# Patient Record
Sex: Female | Born: 1940 | Race: White | Hispanic: Yes
Health system: Western US, Academic
[De-identification: ages and names within clinical notes are randomized; demographics above are authoritative.]

## PROBLEM LIST (undated history)

## (undated) DIAGNOSIS — K5790 Diverticulosis of intestine, part unspecified, without perforation or abscess without bleeding: Secondary | ICD-10-CM

## (undated) DIAGNOSIS — Z87898 Personal history of other specified conditions: Secondary | ICD-10-CM

## (undated) DIAGNOSIS — E876 Hypokalemia: Secondary | ICD-10-CM

## (undated) DIAGNOSIS — C801 Malignant (primary) neoplasm, unspecified: Secondary | ICD-10-CM

## (undated) DIAGNOSIS — F419 Anxiety disorder, unspecified: Secondary | ICD-10-CM

## (undated) DIAGNOSIS — E785 Hyperlipidemia, unspecified: Secondary | ICD-10-CM

## (undated) DIAGNOSIS — I10 Essential (primary) hypertension: Secondary | ICD-10-CM

## (undated) DIAGNOSIS — E079 Disorder of thyroid, unspecified: Secondary | ICD-10-CM

## (undated) DIAGNOSIS — C73 Malignant neoplasm of thyroid gland: Secondary | ICD-10-CM

## (undated) DIAGNOSIS — G4733 Obstructive sleep apnea (adult) (pediatric): Secondary | ICD-10-CM

## (undated) DIAGNOSIS — Z8669 Personal history of other diseases of the nervous system and sense organs: Secondary | ICD-10-CM

## (undated) DIAGNOSIS — Z8719 Personal history of other diseases of the digestive system: Secondary | ICD-10-CM

## (undated) DIAGNOSIS — C189 Malignant neoplasm of colon, unspecified: Secondary | ICD-10-CM

## (undated) DIAGNOSIS — G4730 Sleep apnea, unspecified: Secondary | ICD-10-CM

## (undated) DIAGNOSIS — D649 Anemia, unspecified: Secondary | ICD-10-CM

## (undated) HISTORY — DX: Hypokalemia: E87.6

## (undated) HISTORY — PX: APPENDECTOMY: SHX54

## (undated) HISTORY — PX: TONSILLECTOMY: SUR1361

## (undated) HISTORY — DX: Malignant neoplasm of thyroid gland: C73

## (undated) HISTORY — DX: Personal history of other diseases of the nervous system and sense organs: Z86.69

## (undated) HISTORY — DX: Personal history of other specified conditions: Z87.898

## (undated) HISTORY — DX: Hyperlipidemia, unspecified: E78.5

## (undated) HISTORY — DX: Anxiety disorder, unspecified: F41.9

## (undated) HISTORY — DX: Essential (primary) hypertension: I10

## (undated) HISTORY — DX: Obstructive sleep apnea (adult) (pediatric): G47.33

## (undated) HISTORY — DX: Malignant (primary) neoplasm, unspecified: C80.1

## (undated) HISTORY — DX: Personal history of other diseases of the digestive system: Z87.19

## (undated) HISTORY — DX: Diverticulosis of intestine, part unspecified, without perforation or abscess without bleeding: K57.90

## (undated) HISTORY — DX: Disorder of thyroid, unspecified: E07.9

## (undated) HISTORY — DX: Malignant neoplasm of colon, unspecified: C18.9

## (undated) HISTORY — DX: Anemia, unspecified: D64.9

## (undated) HISTORY — PX: PR APPENDECTOMY: 44950

---

## 1988-10-09 DIAGNOSIS — N63 Unspecified lump in unspecified breast: Secondary | ICD-10-CM

## 1988-10-09 HISTORY — PX: MASTECTOMY: SHX3

## 1988-10-09 HISTORY — DX: Unspecified lump in unspecified breast: N63.0

## 1988-10-09 HISTORY — PX: PR UNLISTED PROCEDURE BREAST: 19499

## 1999-10-10 DIAGNOSIS — C801 Malignant (primary) neoplasm, unspecified: Secondary | ICD-10-CM

## 1999-10-10 HISTORY — DX: Malignant (primary) neoplasm, unspecified: C80.1

## 2000-06-13 ENCOUNTER — Other Ambulatory Visit: Admission: RE | Admit: 2000-06-13 | Discharge: 2000-06-13 | Payer: Self-pay | Admitting: *Deleted

## 2000-08-13 ENCOUNTER — Encounter: Payer: Self-pay | Admitting: Emergency Medicine

## 2000-08-13 ENCOUNTER — Emergency Department (HOSPITAL_COMMUNITY): Admission: EM | Admit: 2000-08-13 | Discharge: 2000-08-14 | Payer: Self-pay | Admitting: Emergency Medicine

## 2002-04-01 ENCOUNTER — Other Ambulatory Visit: Admission: RE | Admit: 2002-04-01 | Discharge: 2002-04-01 | Payer: Self-pay | Admitting: *Deleted

## 2003-04-18 ENCOUNTER — Emergency Department (HOSPITAL_COMMUNITY): Admission: EM | Admit: 2003-04-18 | Discharge: 2003-04-18 | Payer: Self-pay | Admitting: Emergency Medicine

## 2003-04-18 ENCOUNTER — Encounter: Payer: Self-pay | Admitting: Emergency Medicine

## 2003-05-18 ENCOUNTER — Encounter: Payer: Self-pay | Admitting: Cardiology

## 2003-05-18 ENCOUNTER — Inpatient Hospital Stay (HOSPITAL_COMMUNITY): Admission: AD | Admit: 2003-05-18 | Discharge: 2003-05-23 | Payer: Self-pay | Admitting: Cardiology

## 2003-05-19 ENCOUNTER — Encounter: Payer: Self-pay | Admitting: Cardiology

## 2003-05-20 ENCOUNTER — Encounter: Payer: Self-pay | Admitting: Cardiology

## 2004-02-12 ENCOUNTER — Other Ambulatory Visit: Admission: RE | Admit: 2004-02-12 | Discharge: 2004-02-12 | Payer: Self-pay | Admitting: *Deleted

## 2004-11-15 ENCOUNTER — Ambulatory Visit: Payer: Self-pay | Admitting: Internal Medicine

## 2005-05-05 ENCOUNTER — Ambulatory Visit (HOSPITAL_COMMUNITY): Admission: RE | Admit: 2005-05-05 | Discharge: 2005-05-05 | Payer: Self-pay | Admitting: Cardiology

## 2005-05-30 ENCOUNTER — Ambulatory Visit (HOSPITAL_COMMUNITY): Admission: RE | Admit: 2005-05-30 | Discharge: 2005-05-30 | Payer: Self-pay | Admitting: Cardiology

## 2005-08-09 HISTORY — PX: BREAST CAPSULECTOMY: SHX906

## 2005-08-29 ENCOUNTER — Ambulatory Visit (HOSPITAL_COMMUNITY): Admission: RE | Admit: 2005-08-29 | Discharge: 2005-08-29 | Payer: Self-pay | Admitting: Plastic Surgery

## 2005-08-29 ENCOUNTER — Encounter (INDEPENDENT_AMBULATORY_CARE_PROVIDER_SITE_OTHER): Payer: Self-pay | Admitting: Specialist

## 2007-12-26 ENCOUNTER — Ambulatory Visit: Payer: Self-pay | Admitting: Oncology

## 2009-10-09 DIAGNOSIS — C73 Malignant neoplasm of thyroid gland: Secondary | ICD-10-CM

## 2009-10-09 HISTORY — PX: THYROID SURGERY: SHX5115

## 2009-10-09 HISTORY — DX: Malignant neoplasm of thyroid gland: C73

## 2010-01-14 ENCOUNTER — Encounter: Admission: RE | Admit: 2010-01-14 | Discharge: 2010-01-14 | Payer: Self-pay | Admitting: Otolaryngology

## 2010-02-01 ENCOUNTER — Other Ambulatory Visit: Admission: RE | Admit: 2010-02-01 | Discharge: 2010-02-01 | Payer: Self-pay | Admitting: Interventional Radiology

## 2010-02-01 ENCOUNTER — Encounter: Admission: RE | Admit: 2010-02-01 | Discharge: 2010-02-01 | Payer: Self-pay | Admitting: Otolaryngology

## 2010-02-06 HISTORY — PX: TOTAL THYROIDECTOMY: SHX2547

## 2010-02-06 HISTORY — PX: THYROID CYST EXCISION: SHX2511

## 2010-03-08 DIAGNOSIS — C73 Malignant neoplasm of thyroid gland: Secondary | ICD-10-CM

## 2010-03-08 HISTORY — DX: Malignant neoplasm of thyroid gland: C73

## 2010-06-03 ENCOUNTER — Ambulatory Visit: Payer: Self-pay | Admitting: Cardiology

## 2010-06-06 ENCOUNTER — Ambulatory Visit: Payer: Self-pay | Admitting: Cardiology

## 2010-09-22 ENCOUNTER — Ambulatory Visit: Payer: Self-pay | Admitting: Cardiology

## 2010-09-23 ENCOUNTER — Ambulatory Visit: Payer: Self-pay | Admitting: Cardiology

## 2011-01-23 ENCOUNTER — Ambulatory Visit: Payer: Self-pay | Admitting: Cardiology

## 2011-01-23 ENCOUNTER — Other Ambulatory Visit: Payer: Self-pay | Admitting: *Deleted

## 2011-02-03 ENCOUNTER — Encounter: Payer: Self-pay | Admitting: *Deleted

## 2011-02-03 DIAGNOSIS — F419 Anxiety disorder, unspecified: Secondary | ICD-10-CM

## 2011-02-03 DIAGNOSIS — I10 Essential (primary) hypertension: Secondary | ICD-10-CM

## 2011-02-03 DIAGNOSIS — Z87898 Personal history of other specified conditions: Secondary | ICD-10-CM | POA: Insufficient documentation

## 2011-02-03 DIAGNOSIS — E785 Hyperlipidemia, unspecified: Secondary | ICD-10-CM

## 2011-02-08 ENCOUNTER — Other Ambulatory Visit (INDEPENDENT_AMBULATORY_CARE_PROVIDER_SITE_OTHER): Payer: Medicare Other | Admitting: *Deleted

## 2011-02-08 DIAGNOSIS — Z79899 Other long term (current) drug therapy: Secondary | ICD-10-CM

## 2011-02-08 DIAGNOSIS — E7800 Pure hypercholesterolemia, unspecified: Secondary | ICD-10-CM

## 2011-02-08 DIAGNOSIS — C73 Malignant neoplasm of thyroid gland: Secondary | ICD-10-CM

## 2011-02-08 LAB — CBC WITH DIFFERENTIAL/PLATELET
Basophils Relative: 0.5 % (ref 0.0–3.0)
Eosinophils Relative: 1.2 % (ref 0.0–5.0)
Hemoglobin: 13.7 g/dL (ref 12.0–15.0)
Lymphs Abs: 1.7 10*3/uL (ref 0.7–4.0)
Neutro Abs: 3.9 10*3/uL (ref 1.4–7.7)
Neutrophils Relative %: 64.5 % (ref 43.0–77.0)
Platelets: 208 10*3/uL (ref 150.0–400.0)
RDW: 13.9 % (ref 11.5–14.6)
WBC: 6 10*3/uL (ref 4.5–10.5)

## 2011-02-08 LAB — LIPID PANEL
Cholesterol: 270 mg/dL — ABNORMAL HIGH (ref 0–200)
HDL: 42.1 mg/dL (ref >39.00)
Total CHOL/HDL Ratio: 6
Triglycerides: 330 mg/dL — ABNORMAL HIGH (ref 0.0–149.0)

## 2011-02-08 LAB — COMPREHENSIVE METABOLIC PANEL
CO2: 28 mEq/L (ref 19–32)
Chloride: 107 mEq/L (ref 96–112)
GFR: 106.65 mL/min (ref >60.00)
Glucose, Bld: 96 mg/dL (ref 70–99)
Potassium: 3.9 mEq/L (ref 3.5–5.1)

## 2011-02-08 LAB — LDL CHOLESTEROL, DIRECT: Direct LDL: 175.5 mg/dL

## 2011-02-08 LAB — TSH: TSH: 0.05 u[IU]/mL — ABNORMAL LOW (ref 0.35–5.50)

## 2011-02-09 ENCOUNTER — Encounter: Payer: Self-pay | Admitting: Cardiology

## 2011-02-09 ENCOUNTER — Ambulatory Visit (INDEPENDENT_AMBULATORY_CARE_PROVIDER_SITE_OTHER): Payer: Medicare Other | Admitting: Cardiology

## 2011-02-09 DIAGNOSIS — E039 Hypothyroidism, unspecified: Secondary | ICD-10-CM

## 2011-02-09 DIAGNOSIS — E785 Hyperlipidemia, unspecified: Secondary | ICD-10-CM

## 2011-02-09 DIAGNOSIS — I10 Essential (primary) hypertension: Secondary | ICD-10-CM

## 2011-02-09 NOTE — Assessment & Plan Note (Signed)
The patient has a past history of essential hypertension.  She is intolerant of most medications but has been doing well with her present combination of Azor 10/20 one daily.  She's not having a chest pain or shortness of breath.  No dizzy spells or syncope.  No headaches.  No palpitations or PVCs noted

## 2011-02-09 NOTE — Assessment & Plan Note (Signed)
The patient has a past history of thyroid cancer.  She is followed closely by her endocrinologist at [facility].  She is on levothyroxin to suppress her thyroid gland and her thyroid blood work today is satisfactory in that regard

## 2011-02-09 NOTE — Progress Notes (Signed)
Lynett Fish Lindell Date of Birth:  [birth year] [facility] / [facility] [address]   [address] [place], [place]  [address] [phone]           Fax   [phone]  History of Present Illness: This pleasant 70 year old woman is seen for a scheduled 4 month followup office visit.  As a complex past medical history.  She has a past history of thyroid cancer.  She is followed closely for this at [facility].  She has a past history of essential hypertension.  She did not tolerate beta blockers but is tolerating Azor Without side effects.  She is not having any significant ankle edema from the amlodipine component.  She does not have any history of known coronary disease and she had a normal Cardiolite stress test 08/14/07.  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  When she Is in [place] she works out with her trainer 2 or 3 times a week.   Current Outpatient Prescriptions  Medication Sig Dispense Refill  . amlodipine-olmesartan (AZOR) 10-20 MG per tablet Take 1 tablet by mouth daily.        . Ascorbic Acid (VITAMIN C PO) Take 1 tablet by mouth 3 (three) times daily.        Marland Kitchen aspirin 81 MG tablet Take 81 mg by mouth at bedtime.        . B Complex Vitamins (VITAMIN B COMPLEX) TABS Take 1 tablet by mouth daily.        . calcium carbonate (OS-CAL) 600 MG TABS Take 600 mg by mouth 3 (three) times daily.        . Cholecalciferol (VITAMIN D PO) Take 5,000 mg by mouth daily.       Marland Kitchen CINNAMON PO Take 1 tablet by mouth 2 (two) times daily.        . Coenzyme Q10 (COQ-10 PO) Take 1 tablet by mouth 2 (two) times daily.        . Deanol Bitartrate (DMAE BITARTRATE) POWD by Does not apply route daily.        Marland Kitchen levothyroxine (SYNTHROID, LEVOTHROID) 112 MCG tablet Take 0.125 mcg by mouth daily.       . Multiple Vitamins-Minerals (ZINC PO) Take 1 tablet by mouth daily.        . Omega-3 Fatty Acids (OMEGA 3 PO) Take 1 capsule by mouth 3 (three)  times daily.        Marland Kitchen PROBIOTIC CAPS Take 1 capsule by mouth 3 (three) times daily.        . SELENIUM PO Take 1 tablet by mouth daily.        . vitamin E 400 UNIT capsule Take 400 Units by mouth daily.        . Calcium-Magnesium-Vitamin D 600-40-500 MG-MG-UNIT TB24 Take 1 tablet by mouth daily.        . Red Yeast Rice 600 MG CAPS Take 2 capsules by mouth 3 (three) times daily.          Allergies  Allergen Reactions  . Crestor (Rosuvastatin Calcium)     Palp.  . Levaquin   . Lipitor (Atorvastatin Calcium)   . Niacin And Related   . Phenergan   . Sulfa Antibiotics     Patient Active Problem List  Diagnoses  . Hypertension  . Hyperlipidemia  . Anxiety  . History of dizziness  . Hypothyroidism    History  Smoking status  . Former  Smoker  Smokeless tobacco  . Not on file    History  Alcohol Use No    Family History  Problem Relation Age of Onset  . Heart attack Father   . Heart disease      family positive for  . Hypertension Brother   . Hypertension Brother   . Breast cancer Sister     Review of Systems: Constitutional: no fever chills diaphoresis or fatigue or change in weight.  Head and neck: no hearing loss, no epistaxis, no photophobia or visual disturbance. Respiratory: No cough, shortness of breath or wheezing. Cardiovascular: No chest pain peripheral edema, palpitations. Gastrointestinal: No abdominal distention, no abdominal pain, no change in bowel habits hematochezia or melena. Genitourinary: No dysuria, no frequency, no urgency, no nocturia. Musculoskeletal:No arthralgias, no back pain, no gait disturbance or myalgias. Neurological: No dizziness, no headaches, no numbness, no seizures, no syncope, no weakness, no tremors. Hematologic: No lymphadenopathy, no easy bruising. Psychiatric: No confusion, no hallucinations, no sleep disturbance.    Physical Exam: Filed Vitals:   02/09/11 1145  BP: 130/80  Pulse: 68   The general appearance is that  of a well-developed well-nourished woman in no distress.Pupils equal and reactive.   Extraocular Movements are full.  There is no scleral icterus.  The mouth and pharynx are normal.  The neck is supple.  The carotids reveal no bruits.  The jugular venous pressure is normal.  The thyroid is not enlarged.  There is no lymphadenopathy.The chest is clear to percussion and auscultation. There are no rales or rhonchi. Expansion of the chest is symmetrical.  The breasts were not examined.  She has a prior history of breast cancer in the right breast.The precordium is quiet.  The first heart sound is normal.  The second heart sound is physiologically split.  There is no murmur gallop rub or click.  There is no abnormal lift or heave.The abdomen is soft and nontender. Bowel sounds are normal. The liver and spleen are not enlarged. There Are no abdominal masses. There are no bruits.The pedal pulses are good.  There is no phlebitis or edema.  There is no cyanosis or clubbing.Strength is normal and symmetrical in all extremities.  There is no lateralizing weakness.  There are no sensory deficits.The skin is warm and dry.  There is no rash.No muscle weakness or atrophy.  No muscle tenderness.  Assessment / Plan: We reviewed her labs which are overall better.  Continue same medication and be rechecked in 4 months for followup office visit and fasting lab work.

## 2011-02-09 NOTE — Assessment & Plan Note (Signed)
This patient has a long history of severe hypercholesterolemia.  She is intolerant of all of the statin drugs she also is unable to take niacin.  She is taking some over-the-counter form of red yeast rice.  Her lipids remain significantly elevated although they're slightly improved since last visit.  She is being careful with her vegan diet.

## 2011-02-23 ENCOUNTER — Ambulatory Visit (INDEPENDENT_AMBULATORY_CARE_PROVIDER_SITE_OTHER): Payer: Medicare Other | Admitting: Cardiology

## 2011-02-23 ENCOUNTER — Encounter: Payer: Self-pay | Admitting: Cardiology

## 2011-02-23 DIAGNOSIS — R110 Nausea: Secondary | ICD-10-CM

## 2011-02-23 DIAGNOSIS — R42 Dizziness and giddiness: Secondary | ICD-10-CM

## 2011-02-23 DIAGNOSIS — N390 Urinary tract infection, site not specified: Secondary | ICD-10-CM

## 2011-02-23 MED ORDER — MECLIZINE HCL 25 MG PO TABS: 25.0000 mg | ORAL_TABLET | Freq: Three times a day (TID) | ORAL | Status: AC | PRN

## 2011-02-23 MED ORDER — AMOXICILLIN 500 MG PO CAPS: 500.0000 mg | ORAL_CAPSULE | Freq: Three times a day (TID) | ORAL | Status: AC

## 2011-02-23 NOTE — Progress Notes (Signed)
Darlene Conway Sitter Date of Birth:  1941-09-17 [facility] / [facility] [address]   [address] [place], [place]  [address] [phone]           Fax   [phone]  HPI: This 70 year old woman is seen for a work in walking office visit.  She comes in because of dizziness and nausea and possible urinary tract symptoms.  Her illness started last evening.  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  She has noted some urinary frequency and dysuria.  She has a past history of thyroid cancer and a past history of breast cancer end of hypercholesterolemia and essential hypertension  Current Outpatient Prescriptions  Medication Sig Dispense Refill  . amlodipine-olmesartan (AZOR) 10-20 MG per tablet Take 1 tablet by mouth daily.        . Ascorbic Acid (VITAMIN C PO) Take 1 tablet by mouth 3 (three) times daily.        Marland Kitchen aspirin 81 MG tablet Take 81 mg by mouth at bedtime.        . B Complex Vitamins (VITAMIN B COMPLEX) TABS Take 1 tablet by mouth daily.        . calcium carbonate (OS-CAL) 600 MG TABS Take 600 mg by mouth 3 (three) times daily.        . Calcium-Magnesium-Vitamin D 600-40-500 MG-MG-UNIT TB24 Take 1 tablet by mouth daily.        . Cholecalciferol (VITAMIN D PO) Take 5,000 mg by mouth daily.       Marland Kitchen CINNAMON PO Take 1 tablet by mouth 2 (two) times daily.        . Coenzyme Q10 (COQ-10 PO) Take 1 tablet by mouth 2 (two) times daily.        . Deanol Bitartrate (DMAE BITARTRATE) POWD by Does not apply route daily.        Marland Kitchen levothyroxine (SYNTHROID, LEVOTHROID) 112 MCG tablet Take 0.125 mcg by mouth daily.       . Multiple Vitamins-Minerals (ZINC PO) Take 1 tablet by mouth daily.        . Omega-3 Fatty Acids (OMEGA 3 PO) Take 1 capsule by mouth 3 (three) times daily.        Marland Kitchen PROBIOTIC CAPS Take 1 capsule by mouth 3 (three) times daily.        . SELENIUM PO Take 1 tablet by mouth daily.        . vitamin E 400 UNIT capsule Take  400 Units by mouth daily.        Marland Kitchen amoxicillin (AMOXIL) 500 MG capsule Take 1 capsule (500 mg total) by mouth 3 (three) times daily.  20 capsule  0  . meclizine (ANTIVERT) 25 MG tablet Take 1 tablet (25 mg total) by mouth 3 (three) times daily as needed.  30 tablet  0  . DISCONTD: Red Yeast Rice 600 MG CAPS Take 2 capsules by mouth 3 (three) times daily.          Allergies  Allergen Reactions  . Crestor (Rosuvastatin Calcium)     Palp.  . Levaquin   . Lipitor (Atorvastatin Calcium)   . Niacin And Related   . Phenergan   . Sulfa Antibiotics     Patient Active Problem List  Diagnoses  . Hypertension  . Hyperlipidemia  . Anxiety  . History of dizziness  . Hypothyroidism  . Nausea    History  Smoking status  .  Former Smoker  Smokeless tobacco  . Not on file    History  Alcohol Use No    Family History  Problem Relation Age of Onset  . Heart attack Father   . Heart disease      family positive for  . Hypertension Brother   . Hypertension Brother   . Breast cancer Sister     Review of Systems: The patient denies any heat or cold intolerance.  No weight gain or weight loss.  The patient denies headaches or blurry vision.  There is no cough or sputum production. .  There is no hematuria or hematochezia.  The patient denies any muscle aches or arthritis.  The patient denies any rash.  The patient denies frequent falling or instability.  There is no history of depression or anxiety.  All other systems were reviewed and are negative.   Physical Exam: Filed Vitals:   02/23/11 1554  BP: 140/80  Pulse: 108  The general appearance reveals a flushed elderly woman in no acute distress.Pupils equal and reactive.   Extraocular Movements are full.  There is no scleral icterus.  The mouth and pharynx are normal.  The neck is supple.  The carotids reveal no bruits.  The jugular venous pressure is normal.  The thyroid is not enlarged.  There is no lymphadenopathy.The chest is clear  to percussion and auscultation. There are no rales or rhonchi. Expansion of the chest is symmetrical.The precordium is quiet.  The first heart sound is normal.  The second heart sound is physiologically split.  There is no murmur gallop rub or click.  There is no abnormal lift or heave.The abdomen is soft and nontender. Bowel sounds are normal. The liver and spleen are not enlarged. There Are no abdominal masses. There are no bruits.  There is no right upper quadrant tenderness.  Normal extremity without phlebitis or edema and pedal pulses are good    Assessment / Plan: By the time we finished examining her that time was after 5 PM and we could not get any labs.  Her skin feels hot and she appears to be febrile and her symptoms are suggestive of a urinary tract infection so we empirically prescribed amoxicillin 500 mg 3 times a day #20.  She is to go home to bed and to force fluids.  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  She will call us back tomorrow if she's not feeling better and at that point we can arrange for her to get a urinalysis a CBC and a comprehensive metabolic panel.  We also called her in some meclizine to help with the dizziness.  She may also have a component of vertigo.

## 2011-02-23 NOTE — Assessment & Plan Note (Signed)
This patient is seen as a walk-in work in late in the afternoon.  She has been nauseated since last evening.  She has not vomited today.  2 weeks ago she had a similar episode of nausea associated with vomiting which lasted several days.  At that time she thought it might be a virus.  This time she has also been having urinary frequency and dysuria.  She was not aware that she was running a fever Until I commented to her that her skin felt very warm.The patient still has her gallbladder.  She has not had any right upper quadrant pain.  She does have frequent trips out of the country to [place] to visit her mother.

## 2011-02-24 ENCOUNTER — Telehealth: Payer: Self-pay | Admitting: Cardiology

## 2011-02-24 ENCOUNTER — Emergency Department (HOSPITAL_COMMUNITY): Payer: Medicare Other

## 2011-02-24 ENCOUNTER — Emergency Department (HOSPITAL_COMMUNITY)
Admission: EM | Admit: 2011-02-24 | Discharge: 2011-02-24 | Disposition: A | Discharge status: Home / Self Care | Payer: Medicare Other | Attending: Emergency Medicine | Admitting: Emergency Medicine

## 2011-02-24 DIAGNOSIS — R42 Dizziness and giddiness: Secondary | ICD-10-CM | POA: Insufficient documentation

## 2011-02-24 DIAGNOSIS — R259 Unspecified abnormal involuntary movements: Secondary | ICD-10-CM | POA: Insufficient documentation

## 2011-02-24 DIAGNOSIS — G319 Degenerative disease of nervous system, unspecified: Secondary | ICD-10-CM | POA: Insufficient documentation

## 2011-02-24 DIAGNOSIS — R5381 Other malaise: Secondary | ICD-10-CM | POA: Insufficient documentation

## 2011-02-24 LAB — POCT CARDIAC MARKERS: CKMB, poc: 1.3 ng/mL (ref 1.0–8.0)

## 2011-02-24 LAB — DIFFERENTIAL
Basophils Absolute: 0 10*3/uL (ref 0.0–0.1)
Eosinophils Relative: 0 % (ref 0–5)
Lymphocytes Relative: 15 % (ref 12–46)
Lymphs Abs: 2.1 10*3/uL (ref 0.7–4.0)
Monocytes Absolute: 0.9 10*3/uL (ref 0.1–1.0)
Monocytes Relative: 7 % (ref 3–12)
Neutrophils Relative %: 77 % (ref 43–77)

## 2011-02-24 LAB — CBC
MCHC: 35.1 g/dL (ref 30.0–36.0)
MCV: 84.4 fL (ref 78.0–100.0)

## 2011-02-24 LAB — URINE MICROSCOPIC-ADD ON

## 2011-02-24 LAB — URINALYSIS, ROUTINE W REFLEX MICROSCOPIC
Bilirubin Urine: NEGATIVE
Ketones, ur: NEGATIVE mg/dL
Specific Gravity, Urine: 1.009 (ref 1.005–1.030)
pH: 6 (ref 5.0–8.0)

## 2011-02-24 LAB — BASIC METABOLIC PANEL
BUN: 13 mg/dL (ref 6–23)
CO2: 25 mEq/L (ref 19–32)
Chloride: 100 mEq/L (ref 96–112)
GFR calc Af Amer: >60 mL/min (ref >60)
GFR calc non Af Amer: >60 mL/min (ref >60)
Potassium: 3.6 mEq/L (ref 3.5–5.1)
Sodium: 137 mEq/L (ref 135–145)

## 2011-02-24 LAB — APTT: aPTT: 36 seconds (ref 24–37)

## 2011-02-24 NOTE — Telephone Encounter (Signed)
Seen yesterday and is doing worse today.  Spoke with her neighbor who said that she is disoriented, dizzy and is having trouble verbalizing things.  The neighbor would like to speak with Melinda,RN regarding the next steps to take from here.  Possible referral to a neurologist.

## 2011-02-24 NOTE — Consult Note (Signed)
NAME:  Darlene Conway, Darlene Conway                        ACCOUNT NO.:  [id number]   MEDICAL RECORD NO.:  [id number]                   PATIENT TYPE:  INP   LOCATION:  4704                                 FACILITY:  [facility]   PHYSICIAN:  Gabrielle Dare. Janee Morn, M.D.             DATE OF BIRTH:  30-Sep-1941   DATE OF CONSULTATION:  05/21/2003  DATE OF DISCHARGE:                                   CONSULTATION   REASON FOR CONSULTATION:  Gallstones.   HISTORY OF PRESENT ILLNESS:  The patient is a 70 year old white female who  was admitted by Dr. Patty Sermons for uncontrolled hypertension during her  subsequent workup, her blood pressure has come under control. She was noted  to have gallstones with no evidence of acute cholecystitis.  Dr. Patty Sermons  suggested that she may want to go ahead and have her gallbladder taken out  during this admission as she travels frequently. She denies any right upper  quadrant symptoms in the recent past and is not having any abdominal pain at  all right now and has no other current abdominal complaints.   PAST MEDICAL HISTORY:  Right breast cancer and hypertension.   PAST SURGICAL HISTORY:  Appendectomy, tonsillectomy, right mastectomy.   SOCIAL HISTORY:  She does not drink alcohol and she does not smoke.   FAMILY HISTORY:  Positive for hypercholesterolemia and hypertension.   CURRENT MEDICATIONS:  1. Metoprolol 100 mg p.o. daily.  2. Potassium 10 mEq p.o. t.i.d.  3. Cozaar 100 mg p.o. q.h.s.  4. Maxzide/Dyazide one tablet p.o. daily.  5. Crestor 5 mg p.o. daily.  6. Labetalol p.r.n.   REVIEW OF SYSTEMS:  GENERAL:  She is feeling much better than when she was  admitted.  PULMONARY: No complaints.  CARDIOVASCULAR:  No chest pain.  ABDOMEN: No complaints.  MUSCULOSKELETAL: No current complaints. The  remainder of the review of systems is negative.   PHYSICAL EXAMINATION:  VITAL SIGNS:  Afebrile. Awake, alert, and in no acute  distress.  HEENT:  Sclerae nonicteric.  NECK:  Supple.  LUNGS:  Clear to auscultation bilaterally.  HEART:  Regular rate and rhythm.  Peripheral pulses are 2+ and equal  bilaterally.  ABDOMEN: Soft and nontender and nondistended.  There are no masses  appreciated.  EXTREMITIES:  Warm with no edema.  SKIN:  Dry with no rashes.  VITAL SIGNS:  Heart rate 96, blood pressure 130/94, saturation 97% on room  air.   LABORATORY DATA:  Ultrasound report which showed gallstones, but no  pericholecystic fluid, no gallbladder wall thickening and no biliary ductal  dilatation.   CBC on August 9 was within normal limits and LFT's on August 9 were also  within normal limits.   ASSESSMENT:  Cholelithiasis without current symptoms.  Concerning the  patient's job, I do agree it would be a good idea for her to get her  gallbladder out.  She is not willing to go  ahead with the surgery right now.  She is coming to terms with it and requests some time to think about it.  She wishes also to see Anselm Pancoast. Zachery Dakins, M.D. from our practice as he  did her appendectomy in 1990.  Considering the fact that she does not have  any signs of acute cholecystitis, I feel waiting and having this done  electively is an appropriate choice.  I instructed her on the signs and  symptoms of an attack of biliary colic and cholecystitis and also the  symptoms she may get if she were to get choledocholithiasis and she  expressed understanding, but claims she prefers to wait and see Dr.  Zachery Dakins and schedule this electively.  I discussed this at length with her  and her son and I feel this is an appropriate choice and I gave her a card.  She plans to call our office once she is discharged.                                               Gabrielle Dare Janee Morn, M.D.    BET/MEDQ  D:  05/21/2003  T:  05/22/2003  Job:  [id number]

## 2011-02-24 NOTE — Telephone Encounter (Signed)
Patient seen yesterday and given rx for amoxil and meclizine.  Spoke with patient and advised if she felt she needed to see a neurologist today she would need to go to the emergency room.  Since she was having trouble verbalizing I felt she needed to be evaluated by a physician and Dr, Patty Sermons who is her primary was not in the office and covering are cardiologist.  Advised she needed to go to urgent care or emergency room and not wait until next week.  Patient did deny weakness on one side more than the other, just weak in general.  Patient stated she would think about her options and make a decision.  Asked if she had someone to drive her, stated yes.  Strongly encouraged her to go to UC or ED to be evaluated.

## 2011-02-24 NOTE — Discharge Summary (Signed)
NAMEMarland Conway  Conway, [name]                        ACCOUNT NO.:  [id number]   MEDICAL RECORD NO.:  [id number]                   PATIENT TYPE:  INP   LOCATION:  4704                                 FACILITY:  [facility]   PHYSICIAN:  Cassell Clement, M.D.              DATE OF BIRTH:  01-Mar-1941   DATE OF ADMISSION:  05/18/2003  DATE OF DISCHARGE:  05/23/2003                                 DISCHARGE SUMMARY   FINAL DIAGNOSES:  1. Malignant hypertension.  2. Chest pain.  3. Headache.  4. Past history of breast cancer.  5. Hypercholesterolemia.  6. Diarrhea.  7. Cholelithiasis.  8. Hypokalemia.  9. Anxiety.  10.      Hyperlipidemia.   OPERATION PERFORMED:  Magnetic resonance angiography of renal arteries.   HISTORY:  This 70 year old woman was admitted with severe hypertension and  chest pressure.  She has a long history of mild essential hypertension with  recent acceleration.  She presented to the [facility] Emergency Room on April 18, 2003 with a blood pressure of 195/100 and at that time was found to be  hyponatremic.  Earlier that day, she had gone for a hike in the very hot  weather and had become dehydrated, exacerbated by having been placed on  diuretics for her blood pressure shortly before that.  At the time of this  admission, she is on Cozaar 100 mg and Toprol 150 mg.  She was seen at  Urgent Care by Dr. Stan Head. Daub on May 14, 2003 and she reports that her  blood pressure there was in the 230/130 range.  A 24-hour urine collection  for metanephrines was arranged by Dr. Cleta Alberts to be sent to [facility].  The  patient was seen as a work-in in our office on May 18, 2003, complaining  of intermittent chest pressure, headache and her blood pressure in the left  arm was 180/115 and she was complaining of blurred vision and it was felt  best to admit her for inpatient workup.   PHYSICAL EXAMINATION:  VITAL SIGNS:  Physical exam on admission included  blood pressure of 180/115.  Pulse  is 70.  HEENT:  The disks are flat but there is arteriolar narrowing.  NECK:  Jugular venous pressure is normal.  Carotids normal.  LUNGS:  Lungs clear.  HEART:  Heart reveals S4 gallop.  ABDOMEN:  Abdomen is soft and nontender.  There are no renal bruits heard.  EXTREMITIES:  Extremities show no edema.  Pedal pulses are present.   HOSPITAL COURSE:  The patient was admitted to telemetry.  We continued to  run her beta blocker and her ARB.  She also was given oral Catapres by the  on-call physician later that evening with subsequent marked lowering of her  blood pressure and symptoms of awakeness, clamminess and restless legs.  When seen on rounds the next morning, her blood pressure was down to 103/59  and she felt very weak.  Medication was held and an MRI/MRA of the adrenals  and kidneys were scheduled.  The MRA showed no renal artery stenosis, but  the MRI showed question of gallbladder stones versus polyps.  The patient,  on telemetry, had six beats of wide complex tachycardia at 6 a.m. on May 20, 2003.  This did not require specific therapy.  Lipids were again noted  to be markedly elevated.  In the past, she had refused most efforts at  adding a statin drug.  She had had brief encounters with Zocor and Lipitor,  both of which caused leg pain and were discontinued.  We felt that we might  try a half dose of Crestor, which would be only 5 mg a day, but the patient,  after the first dose, complained of abdominal pain and Crestor was  subsequently stopped, although it was not clear as to whether the Crestor  was related to her symptoms or not.  She had a second bout of significant  hypertension and once again was given a very small dose of clonidine with  subsequent severe side-reaction, so we have eliminated clonidine from  further consideration in her drug armamentarium.  The patient remained  extremely anxious throughout the hospital stay, but would not initially take  any  antianxiety drugs.  She did agree to having a sleeping pill ordered for  her, however.  It was felt that the patient should be evaluated by our  surgical colleagues in regard to possible elective cholecystectomy.  The  patient has been having some symptoms of nausea and diarrhea but no clearcut  gallbladder colic.  Nonetheless, because of her frequent out-of[place]the[place]country  traveling, it would be reasonable to consider an elective cholecystectomy.  Dr. Gabrielle Dare. Darlene Conway saw the patient on the evening of May 21, 2003.  The patient, however, told him that she was not willing to proceed with  cholecystectomy at this time but wishes to think it over and then discuss  with Dr. Anselm Pancoast. Weatherly, who is her previous Careers adviser.  The patient's  blood pressure gradually came under control with a combination of beta  blocker, ARB and spironolactone.  By May 23, 2003, she was running a  blood pressure of 110 to 130 over 70 to 76.  Telemetry was sinus rhythm  without ectopy and potassium was up to 4.7.  It was felt that we would make  another attempt later to deal again with her elevated cholesterol.  The  patient was therefore discharged improved on the following regimen:   DISCHARGE MEDICATIONS:  1. Toprol-XL 50 mg three times a day with meals.  2. Cozaar 100 mg daily at lunch.  3. K-Dur 10 mEq three times a day with meals.  4. Spironolactone 25 mg daily.  5. Ambien 5 mg h.s. p.r.n.   ACTIVITY:  She is to gradually increase her activity.   DIET:  She will be on a low-cholesterol, low-fat diet.   FOLLOWUP:  She will arrange an appointment to see Dr. Thornton Dales to  discuss her gallbladder situation.  She is to see Dr. Cassell Clement on  May 27, 2003 or May 28, 2003 for office visit and BMET and she is to  bring her pink sheet with her.   CONDITION ON DISCHARGE:  Condition on discharge is improved.  Cassell Clement, M.D.     TB/MEDQ  D:  06/04/2003  T:  06/06/2003  Job:  [id number]   cc:   Gabrielle Dare. Darlene Conway, M.D.  [facility]  [address] [place], [place] [address]  Fax: [phone]   Anselm Pancoast. Zachery Dakins, M.D.  [address]  [place]  [place] [address]  Fax: [phone]   Stan Head. Cleta Alberts, M.D.  [address]  [place]  [place] [address]  Fax: [phone]

## 2011-02-24 NOTE — Op Note (Signed)
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   OPERATION/PROCEDURE:  Partial capsulectomy, removal of defective breast  implant and replacement with Mentor, smooth-walled, moderate profile, gel-  filled prosthesis, 250 mL.   SURGEON:  Pleas Patricia, M.D.   ANESTHESIA:  General endotracheal anesthesia.   FINDINGS:  The patient, many years ago, had had an immediate breast  reconstruction with the placement of a gel-filled prosthesis.  She had  episodes of swelling of the right breast and what appeared to be on  mammography a saline gel prosthesis.  The prosthesis was a gel-filled  prosthesis which had leaked out a considerable amount of gel, approximately  50 mL or more and on the posterior surface during the removal, there was  noted to be a leak or defect in the posterior surface.  The implant was  removed and in areas where there appeared to be some accumulation of  silicone, particular in the posterior capsule, this was removed, submitted  as a part of the specimen.  The 250cc implant noted above was used as  replacement.   DESCRIPTION OF PROCEDURE:  The patient was brought to the operating room,  given general endotracheal anesthetic, prepped with Betadine and draped  about both breasts in a sterile fashion.  The previous transverse mastectomy  scar was excised in the middle aspect.  The dissection continued  down to the  pectoralis major muscle.  The muscle was split in the direction of its  fibers with electrocautery unit.  Anterior capsule was opened and the  implant was noted to be present with a considerable amount of gel leak  throughout the paraprosthetic space.  Implant was removed and the suction  was used to remove essentially all the gel that was present utilizing warm  saline.  There was some scar tissue bridging across the space from front to  back and that was excised and then there was some thickened capsule,  particularly on the posterior surface, but some along the periphery which  was also excised.  The remainder of the capsule appeared to be reasonably  thin.  A peripheral capsulotomy was performed.  Bleeding was controlled with  electrocautery unit and there was noted to be hemostasis.  The wound was  irrigated with normal saline.  At this point a new implant was placed which  was a 250 mL Mentor moderate, smooth-walled, gel-filled prosthesis.  The  muscle was closed with interrupted 3-0 Vicryl, the subcutaneous tissue with  interrupted 3-0  Monocryl and the skin with running subcuticular 4-0 Monocryl.  Steri-Strips,  Xeroform, 4 x 8's, ABD and circumthoracic Ace bandage were applied.  The  patient tolerated the procedure well and was able to be discharged from the  operating room to the recovery room subsequently to be followed by me as an  outpatient.      Consuello Bossier., M.D.  Electronically Signed     HH/MEDQ  D:  08/29/2005  T:  08/30/2005  Job:  [id number]

## 2011-02-24 NOTE — H&P (Signed)
NAME:  Darlene Conway, Darlene Conway NO.:  [id number]   MEDICAL RECORD NO.:  [id number]                   PATIENT TYPE:  INP   LOCATION:  4704                                 FACILITY:  [facility]   PHYSICIAN:  Cassell Clement, M.D.              DATE OF BIRTH:  March 26, 1941   DATE OF ADMISSION:  05/18/2003  DATE OF DISCHARGE:                                HISTORY & PHYSICAL   CHIEF COMPLAINT:  Uncontrolled hypertension, headache and chest pressure.   HISTORY OF PRESENT ILLNESS:  This is a 70 year old widowed Caucasian  woman  who was admitted with severe  hypertension, unresponsive to outpatient  medication, associated with chest pressure, headaches and blurred vision.  She has a long history of mild essential hypertension, but had experienced a  recent  acceleration. In fact, she was seen in the [facility]  emergency room on April 18, 2003, 1 month ago with blood pressure 195/100.  That occurred on the evening  after she had been hiking in the hot sun and  had become somewhat dehydrated. She presented quite hypertensive, and then  after Clonidine, became shocky and hypertensive and required  IV fluid for  correction of hypotension. She was also found to be hyponatremic and  hypokalemic, attributed to dehydration  from the hike and hot weather as  well as having recently been started on a diuretic which was stopped at this  point.   The patient was seen by Dr. Earl Lites at [facility] last  week on May 14, 2003, and she reports that her blood pressure on  presentation there was 230/130. It was at that point that he increased her  Toprol from 100 to 150 mg a day. He did a urinalysis  and also sent off a  request for a 24-hour urine collection for metanephrines which was turned in  today  to [facility] and is pending.   The patient came as a walk-in in our office today  complaining of  intermittent chest pressure, headache, and was found to have  symptoms of  blurred vision when her blood pressure went high. Her blood pressure on  admission to our office today was 180/150, and it was felt best to admit for  further evaluation and stabilization.   CURRENT MEDICATIONS:  1. Toprol XL 150 mg a day.  2. Flax seed once a day.  3. Omega once a day.  4. Zinc once a day.  5. Beta TCT once a day.  6. Hydrochloric acid once a day.  7. Vitamin E once a day.  8. Coenzyme Q10 once a day.  9. B complex once a day.  10.      Policosanol once a day.  11.      Cozaar 100 mg daily.  12.      Fosamax weekly.   ALLERGIES:  SHE DOES NOT TOLERATE NIACIN OR LIPITOR AND SHE  HAD AN ADVERSE  REACTION RECENTLY TO PHENERGAN GIVEN FOR NAUSEA.   PAST MEDICAL HISTORY:  Cancer of the right breast in 1990. She had a  mastectomy  with immediate reconstruction and postoperative radiation.   SOCIAL HISTORY:  She does not drink alcohol. She is a nonsmoker. She is a  widow. Her husband died of cancer. She has 3 sons. She has her own  consulting business.   FAMILY HISTORY:  Positive for hypercholesteremia.   REVIEW OF SYSTEMS:  She does have a history of osteopenia and osteoporosis.  She sees Dr. Gildardo Griffes for oncologic care. She has had a history of  severe  hypercholesteremia but has not been willing to take statin therapy  after having an adverse reaction to Lipitor. She previously had been  followed  for years on low-dose Synthroid by Dr. Lilla Shook but more  recently stopped her thyroid supplement and has remained clinically  euthyroid and clinically euthyroid by blood test.   PHYSICAL EXAMINATION:  VITAL SIGNS:  Blood pressure 180/115, pulse 70 and  regular, respirations are normal.  GENERAL:  She is a very anxious woman who is complaining of blurred vision  and chest pressure.  HEENT:  The disks are flat but there is arterial narrowing. There are no  hemorrhages or exudate. Ears normal. Mouth and pharynx  normal.  NECK:  Jugular venous  pressure normal. Carotids normal. Thyroid normal. No  lymphadenopathy.  CHEST:  Clear to auscultation and percussion.  HEART:  No S4 gallop, no murmur, no S3, no rub.  BREASTS:  Not examined.  ABDOMEN:  Soft, nontender.  EXTREMITIES:  No edema  and 1+ pedal pulses.  NEUROLOGIC:  She is alert and oriented. Deep tendon reflexes are 1+.   LABORATORY DATA:  Pending.   IMPRESSION:  1. Uncontrolled hypertension, accelerated, rule out veno vascular     hypertension, rule out pheochromocytoma.  2. Chest pressure, uncertain etiology with normal Adenosine Cardiolite April 08, 2003, with ejection fraction 71%.  3. Hypercholesteremia.  4. Allergies to Phenergan, Lipitor and niacin.  5. Osteoporosis.  6. History of breast cancer in 1990, right breast.  7. Past history of peritonitis, post appendicitis.    DISPOSITION:  Admit to telemetry. Continue beta blocker and ARB therapy.  Treat acute exacerbation with IV Labetalol. Consider MRA/MRI of renal  arteries to look for renal artery stenosis and look at the adrenals for  pheochromocytoma. Further  therapy as dictated by admission labs.                                                Cassell Clement, M.D.    TB/MEDQ  D:  05/18/2003  T:  05/18/2003  Job:  [id number]   cc:   Brett Canales A. Cleta Alberts, M.D.  [address]  [place]  [place] [address]  Fax: [phone]

## 2011-02-24 NOTE — Consult Note (Signed)
NAME:  Darlene Conway, RIDOLFI NO.:  [id number]   MEDICAL RECORD NO.:  [id number]                   PATIENT TYPE:  EMS   LOCATION:  MAJO                                 FACILITY:  [facility]   PHYSICIAN:  Vesta Mixer, M.D.              DATE OF BIRTH:  10-07-1941   DATE OF CONSULTATION:  DATE OF DISCHARGE:                                   CONSULTATION   HISTORY:  Mrs. Darlene Conway is a 70 year old female who presents to the  emergency room around 12 midnight complaining of weakness and dizziness.  She states that she had been hiking out in the sun all day and felt quite  weak.  We were asked to see her for further evaluation of some mental status  changes following administration of some blood pressure medicines.   Mrs. Darlene Conway is a 70 year old female.  She is normally followed by Dr. Ronny Flurry.  It was quite difficult to get history from her, since she  received IV Phenergan and is somewhat somnolent.   Mrs. Darlene Conway presented to the ER last night with a blood pressure of  195/100.  She stated that she felt quite weak and washed out.  She was found  to be hyponatremic.  She received clonidine 0.2 mg p.o.  She vomited that up  and then, at that point, received IV Phenergan and an additional 0.2 mg of  clonidine.  Several hours later, she dropped her pressure fairly  dramatically and had some lethargy.  Her blood pressure dropped into the  80/55 range.  She was given IV normal saline and stabilized fairly well.  An  emergent head CT was negative.   CURRENT MEDICATIONS:  Benicar 40/12.5 mg HCT, Toprol XL once a day.   ALLERGIES:  She has no known drug allergies.   PAST MEDICAL HISTORY:  1. History of hypertension.  2. History of breast cancer, status post mastectomy.   SOCIAL HISTORY:  The patient has a remote history of smoking but does not  smoke regularly now.  She does not drink alcohol.   FAMILY HISTORY:  Negative.   REVIEW OF SYSTEMS:  Was  reviewed and is essentially negative.   PHYSICAL EXAMINATION:  GENERAL:  She is a middle-aged female in no acute  distress.  She is slightly somnolent but is arousable and appropriate.  VITAL SIGNS:  Her temperature is 98.8.  Her blood pressure currently is  102/62.  Her heart rate is 66.  HEENT:  Reveals 2+ carotids.  She has no bruits.  There is no JVD, no  thyromegaly.  LUNGS:  Clear to auscultation.  HEART:  Regular rate.  S1, S2.  She has a soft 1/6 systolic ejection murmur  at the left sternal border.  ABDOMEN:  Her abdomen exam reveals good bowel sounds and is nontender.  EXTREMITIES:  She has no cyanosis, clubbing or edema.  NEUROLOGIC:  She is quite  somnolent, but her neuro exam is otherwise  nonfocal.   LABORATORY DATA:  Reveals a sodium of 125, potassium of 3.2, chloride of 93,  BUN of 11, glucose of 134.  Hematocrit is 43%; hemoglobin is 15.0.  Her CPK  is 52, and her pro time is normal.   A followup i-STAT revealed an improved sodium of 134 following  administration of the normal saline.   Her EKG reveals normal sinus rhythm/sinus bradycardia.  She has no ST or T  wave changes.   Mrs. Darlene Conway presents with what appears to be heat exhaustion.  She  appeared to be somewhat volume-depleted but had a high blood pressure  probably because of adrenaline.  She received clonidine and dropped her  pressure dramatically.  This is probably because of her overall volume  depletion.  She responded quite nicely and appropriately to normal saline.  Her hemodynamic status is stable.  She is somnolent secondary to the IV  Phenergan.  At this point, I think that it is safe for her to go home.  We  will have her stay out of the heat for the next several days, and I will  have her see Dr. Patty Sermons early next week.  We will hold her Benicar today  as well as her Toprol.  She will resume her medications tomorrow.  All of  her other medical problems remained stable.                                                Vesta Mixer, M.D.    PJN/MEDQ  D:  04/18/2003  T:  04/19/2003  Job:  [id number]   cc:   Cassell Clement, M.D.  [address], [address]  [place]  [place] [address]  Fax: [phone]

## 2011-02-26 ENCOUNTER — Other Ambulatory Visit: Payer: Self-pay | Admitting: Cardiology

## 2011-02-26 DIAGNOSIS — I10 Essential (primary) hypertension: Secondary | ICD-10-CM

## 2011-02-26 NOTE — Telephone Encounter (Signed)
Agree with advice given.  She went to ER where CT of head and other w/u was negative and she was sent home.

## 2011-02-27 ENCOUNTER — Telehealth: Payer: Self-pay | Admitting: *Deleted

## 2011-02-27 NOTE — Telephone Encounter (Signed)
escribe request  

## 2011-02-27 NOTE — Telephone Encounter (Signed)
FYI patient was advised to call from emergency room visit.  Stated she was feeling some better, but now having diarrhea.  Stated she had started eating yogurt for diarrhea and didn't think she needed an appointment.  Advised to call back if didn't continue to improve

## 2011-02-27 NOTE — Telephone Encounter (Signed)
The diarrhea may also be from the amoxicillin which she should now be finishing.

## 2011-03-02 ENCOUNTER — Encounter: Payer: Self-pay | Admitting: *Deleted

## 2011-03-02 ENCOUNTER — Telehealth: Payer: Self-pay | Admitting: Cardiology

## 2011-03-02 NOTE — Telephone Encounter (Signed)
Pt called wanted to know if she should continue taking antibiotics please call

## 2011-03-02 NOTE — Telephone Encounter (Signed)
No need to refill the antibiotic.  The lab tests in the emergency room did not show any sign of bacterial infection.  Suspect that this may have been a virus.  Continue to rest at home and her strength should gradually improve.  Call us if she starts to run fever or starts to worsen clinically again.

## 2011-03-02 NOTE — Telephone Encounter (Signed)
Advised patient to call if worse or no better

## 2011-03-02 NOTE — Telephone Encounter (Signed)
Will finish antibiotic today.  Still has no energy and sleeping a lot.  Yesterday went to store and came home and slept for two hours.  Today went to bed after breakfast and slept for an hour.  Denies any fever or real symptoms, just doesn't know what to do from here.  Please advise.

## 2011-05-26 ENCOUNTER — Other Ambulatory Visit: Payer: Self-pay | Admitting: Cardiology

## 2011-05-26 DIAGNOSIS — I119 Hypertensive heart disease without heart failure: Secondary | ICD-10-CM

## 2011-06-15 ENCOUNTER — Other Ambulatory Visit: Payer: Self-pay | Admitting: Cardiology

## 2011-06-15 DIAGNOSIS — E785 Hyperlipidemia, unspecified: Secondary | ICD-10-CM

## 2011-06-15 DIAGNOSIS — I10 Essential (primary) hypertension: Secondary | ICD-10-CM

## 2011-06-19 ENCOUNTER — Other Ambulatory Visit: Payer: Self-pay | Admitting: Cardiology

## 2011-06-19 ENCOUNTER — Other Ambulatory Visit: Payer: Medicare Other | Admitting: *Deleted

## 2011-06-19 DIAGNOSIS — I10 Essential (primary) hypertension: Secondary | ICD-10-CM

## 2011-06-20 ENCOUNTER — Ambulatory Visit (INDEPENDENT_AMBULATORY_CARE_PROVIDER_SITE_OTHER): Payer: Medicare Other | Admitting: *Deleted

## 2011-06-20 DIAGNOSIS — E785 Hyperlipidemia, unspecified: Secondary | ICD-10-CM

## 2011-06-20 DIAGNOSIS — I10 Essential (primary) hypertension: Secondary | ICD-10-CM

## 2011-06-21 ENCOUNTER — Encounter: Payer: Self-pay | Admitting: Cardiology

## 2011-06-21 ENCOUNTER — Ambulatory Visit (INDEPENDENT_AMBULATORY_CARE_PROVIDER_SITE_OTHER): Payer: Medicare Other | Admitting: Cardiology

## 2011-06-21 VITALS — BP 130/82 | HR 78 | Wt 145.0 lb

## 2011-06-21 DIAGNOSIS — Z85850 Personal history of malignant neoplasm of thyroid: Secondary | ICD-10-CM

## 2011-06-21 DIAGNOSIS — E785 Hyperlipidemia, unspecified: Secondary | ICD-10-CM

## 2011-06-21 DIAGNOSIS — I119 Hypertensive heart disease without heart failure: Secondary | ICD-10-CM

## 2011-06-21 DIAGNOSIS — I10 Essential (primary) hypertension: Secondary | ICD-10-CM

## 2011-06-21 DIAGNOSIS — E039 Hypothyroidism, unspecified: Secondary | ICD-10-CM

## 2011-06-21 DIAGNOSIS — R110 Nausea: Secondary | ICD-10-CM

## 2011-06-21 DIAGNOSIS — E7800 Pure hypercholesterolemia, unspecified: Secondary | ICD-10-CM

## 2011-06-21 LAB — HEPATIC FUNCTION PANEL
ALT: 21 U/L (ref 0–35)
AST: 21 U/L (ref 0–37)
Total Protein: 6.7 g/dL (ref 6.0–8.3)

## 2011-06-21 LAB — BASIC METABOLIC PANEL
BUN: 9 mg/dL (ref 6–23)
CO2: 28 mEq/L (ref 19–32)
Chloride: 105 mEq/L (ref 96–112)
Creatinine, Ser: 0.6 mg/dL (ref 0.4–1.2)
Potassium: 4.7 mEq/L (ref 3.5–5.1)

## 2011-06-21 LAB — LDL CHOLESTEROL, DIRECT: Direct LDL: 177.7 mg/dL

## 2011-06-21 LAB — LIPID PANEL: Cholesterol: 290 mg/dL — ABNORMAL HIGH (ref 0–200)

## 2011-06-21 NOTE — Assessment & Plan Note (Signed)
The patient has a history of thyroid cancer and is followed by endocrinology at [facility].  Recently the patient was seen at the integrative medicine clinic at [facility] who suggested that she adjust her thyroid dose upward.  She did that with the okay of her endocrinologist at [facility].  She has not felt any different since making The medication adjustment.

## 2011-06-21 NOTE — Progress Notes (Signed)
Darlene Conway Date of Birth:  Oct 07, 1941 [facility] [address]   [address] [place], [place]  [address] [phone]           Fax   [phone]  History of Present Illness: This pleasant 69 on is seen for a scheduled 4 month followup office visit.  She has a past history of dyslipidemia and a history of essential hypertension.  She also has a history of thyroid cancer.  She also has a history of remote breast cancer which she developed in 2001.  She does not have any history of ischemic heart disease.  She had a normal walking adenosine stress test on 08/14/07.  She has a ejection fraction of 74% and no wall motion abnormalities.  Current Outpatient Prescriptions  Medication Sig Dispense Refill  . Ascorbic Acid (VITAMIN C PO) Take 1 tablet by mouth daily.       Marland Kitchen aspirin 81 MG tablet Take 81 mg by mouth at bedtime.        . AZOR 10-20 MG per tablet TAKE 1 TABLET BY MOUTH DAILY  30 tablet  PRN  . calcium carbonate (OS-CAL) 600 MG TABS Take 600 mg by mouth 2 (two) times daily with a meal.       . Cholecalciferol (VITAMIN D PO) Take 5,000 mg by mouth daily.       Marland Kitchen levothyroxine (SYNTHROID, LEVOTHROID) 112 MCG tablet Take 0.125 mcg by mouth daily.       . Multiple Vitamins-Minerals (ZINC PO) Take 1 tablet by mouth daily.        . Omega-3 Fatty Acids (OMEGA 3 PO) Take 1 capsule by mouth daily.       Marland Kitchen PROBIOTIC CAPS Take 1 capsule by mouth 3 (three) times daily. Once or twice daily        Allergies  Allergen Reactions  . Crestor (Rosuvastatin Calcium)     Palp.  . Levaquin   . Lipitor (Atorvastatin Calcium)   . Meclizine     Very bad reaction  . Niacin And Related   . Phenergan   . Sulfa Antibiotics     Patient Active Problem List  Diagnoses  . Hypertension  . Hyperlipidemia  . Anxiety  . History of dizziness  . Hypothyroidism  . Nausea    History  Smoking status  . Former Smoker  Smokeless tobacco  . Not on file    History    Alcohol Use No    Family History  Problem Relation Age of Onset  . Heart attack Father   . Heart disease      family positive for  . Hypertension Brother   . Hypertension Brother   . Breast cancer Sister     Review of Systems: Constitutional: no fever chills diaphoresis or fatigue or change in weight.  Head and neck: no hearing loss, no epistaxis, no photophobia or visual disturbance. Respiratory: No cough, shortness of breath or wheezing. Cardiovascular: No chest pain peripheral edema, palpitations. Gastrointestinal: No abdominal distention, no abdominal pain, no change in bowel habits hematochezia or melena. Genitourinary: No dysuria, no frequency, no urgency, no nocturia. Musculoskeletal:No arthralgias, no back pain, no gait disturbance or myalgias. Neurological: No dizziness, no headaches, no numbness, no seizures, no syncope, no weakness, no tremors. Hematologic: No lymphadenopathy, no easy bruising. Psychiatric: No confusion, no hallucinations, no sleep disturbance.    Physical Exam: Filed Vitals:   06/21/11 1132  BP: 130/82  Pulse: 78  The general appearance  reveals a well-developed well-nourished woman in no distress.  Normal head and neck.The chest is clear to percussion and auscultation. There are no rales or rhonchi. Expansion of the chest is symmetrical.  The precordium is quiet.  The first heart sound is normal.  The second heart sound is physiologically split.  There is no murmur gallop rub or click.  There is no abnormal lift or heave.   The abdomen is soft and nontender. Bowel sounds are normal. The liver and spleen are not enlarged. There Are no abdominal masses. There are no bruits.  The pedal pulses are good.  There is no phlebitis or edema.  There is no cyanosis or clubbing.  Strength is normal and symmetrical in all extremities.  There is no lateralizing weakness.  There are no sensory deficits.  The skin is warm and dry.  There is no  rash.     Assessment / Plan:  Continue present medication.  Work harder on diet and exercise.  Recheck in 4 months for followup office visit and fasting lab work.

## 2011-06-21 NOTE — Assessment & Plan Note (Signed)
The patient has a long history of essential hypertension.  She did not tolerate numerous medications but is tolerating Azor 10/20 daily.She is not having any exertional chest pain or symptoms of congestive heart failure.  She's had no further dizziness or syncope.  She's not been aware of any palpitations.  She does workout with a trainer once or twice a week.

## 2011-06-21 NOTE — Assessment & Plan Note (Signed)
When we last saw the patient has a work in walk-in office visit 02/23/11 she had severe nausea and dizziness and suspected urinary tract symptoms.  She was given meclizine for the nausea and dizziness and had an adverse reaction to it and we have labeled in her list of allergies subsequently.

## 2011-06-21 NOTE — Assessment & Plan Note (Signed)
The patient has severe hypercholesterolemia.  Unfortunately she is intolerant of all of the statin medications.  She is on a vegan diet which has helped.

## 2011-06-29 NOTE — Progress Notes (Signed)
Copy given at [facility]

## 2011-09-02 IMAGING — US US SOFT TISSUE HEAD/NECK
1 series · 14 of 25 positions shown · non-contrast
Comparison: None.

CLINICAL DATA: evaluate thyroid nodule

THYROID ULTRASOUND
TECHNIQUE: Ultrasound examination of the thyroid gland and
adjacent soft tissues was performed.

[Series 1: us soft tissue head/neck · 0.05mm/px · 14 of 43 slices shown]
[im 1/43]
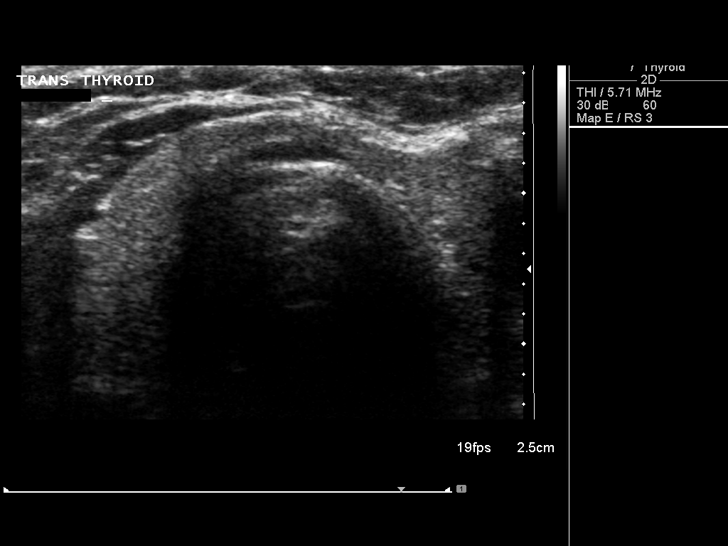
[im 4/43]
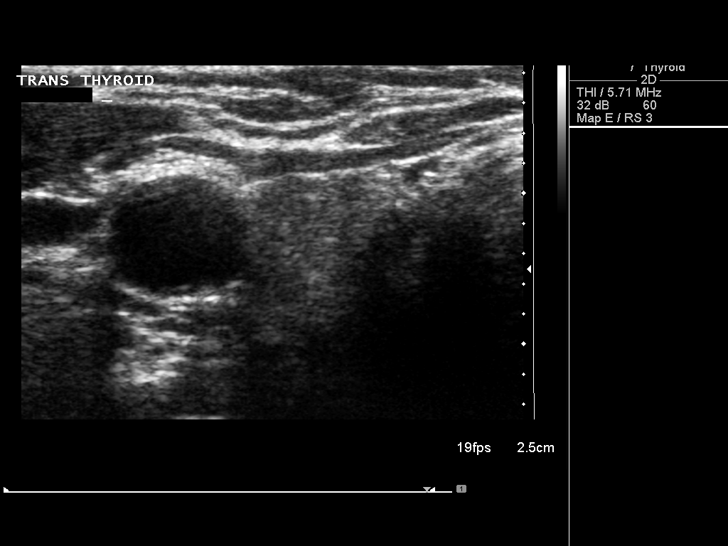
[im 8/43]
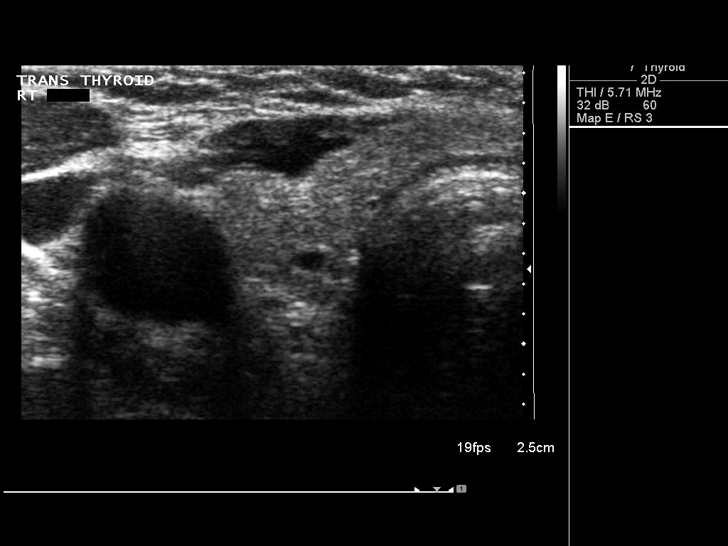
[im 11/43]
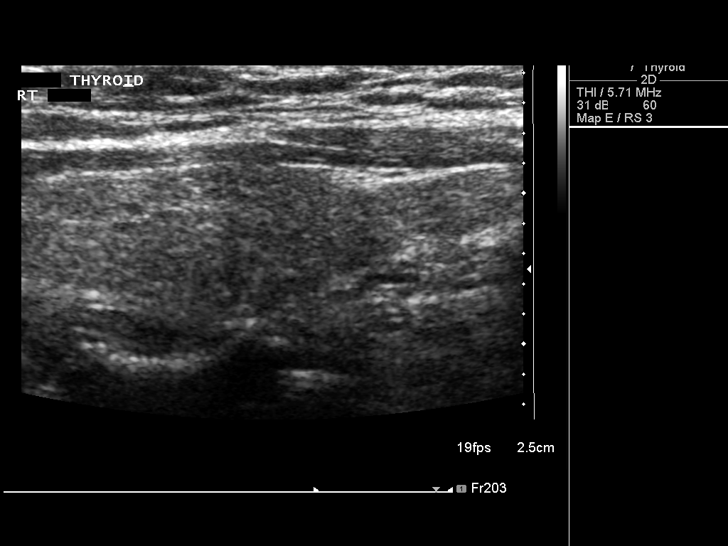
[im 15/43]
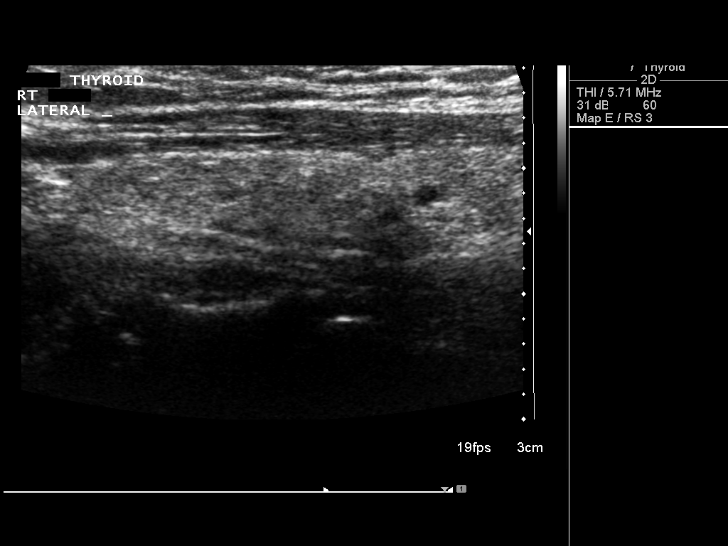
[im 16/43]
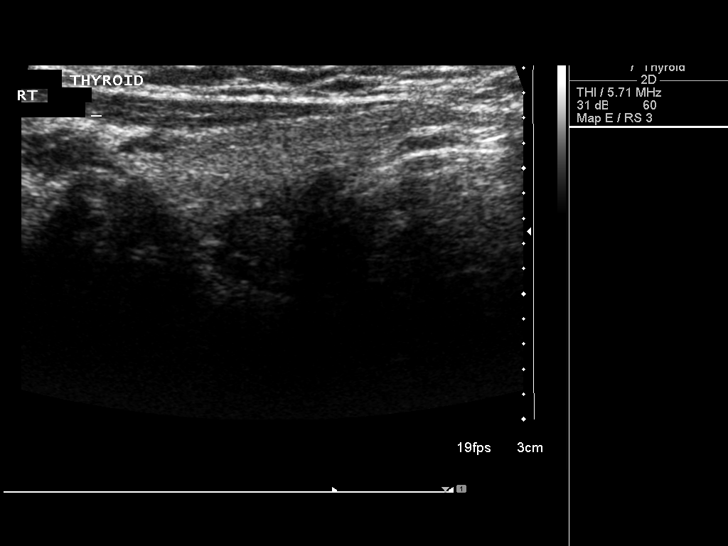
[im 20/43]
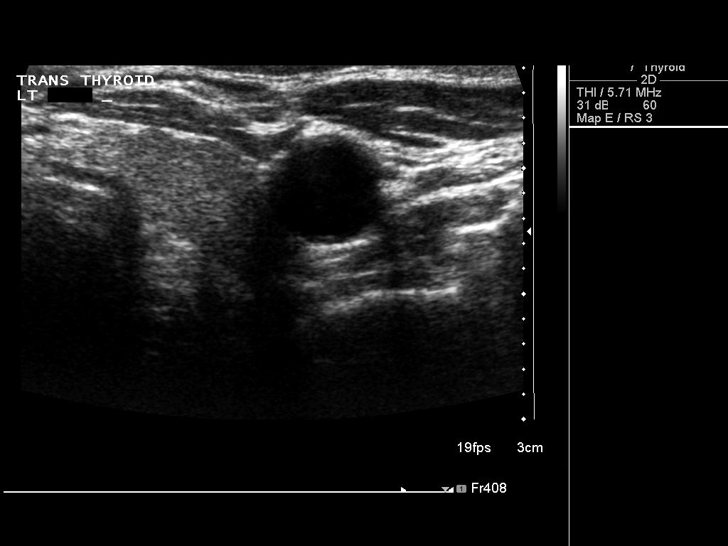
[im 23/43]
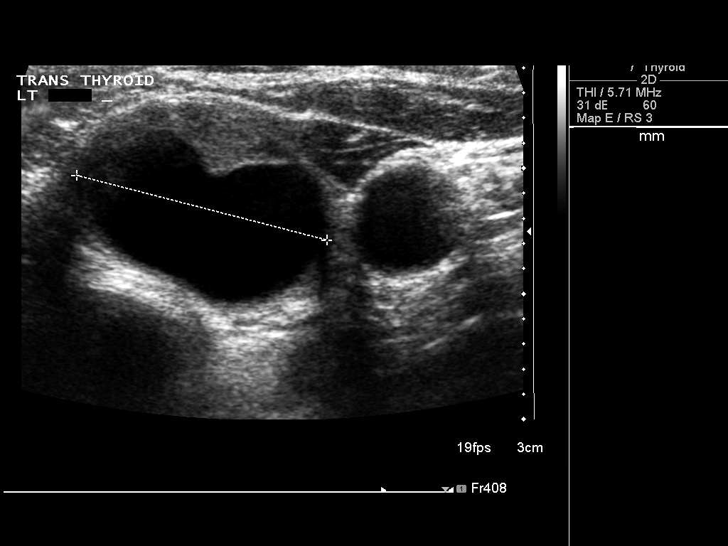
[im 27/43]
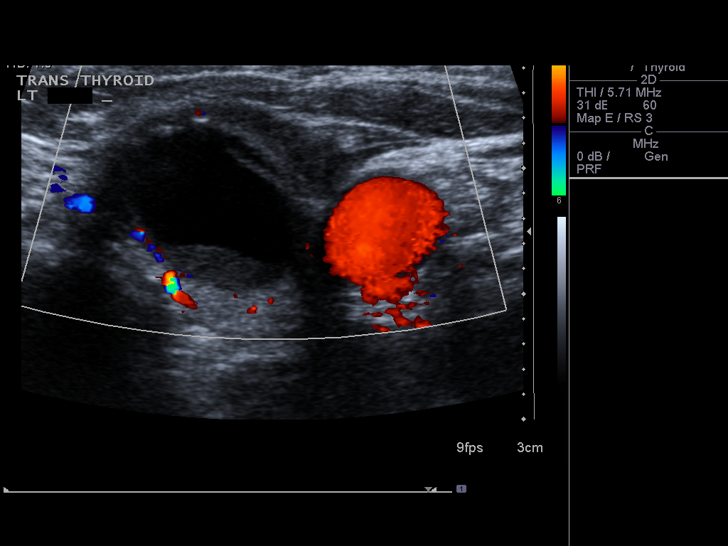
[im 29/43]
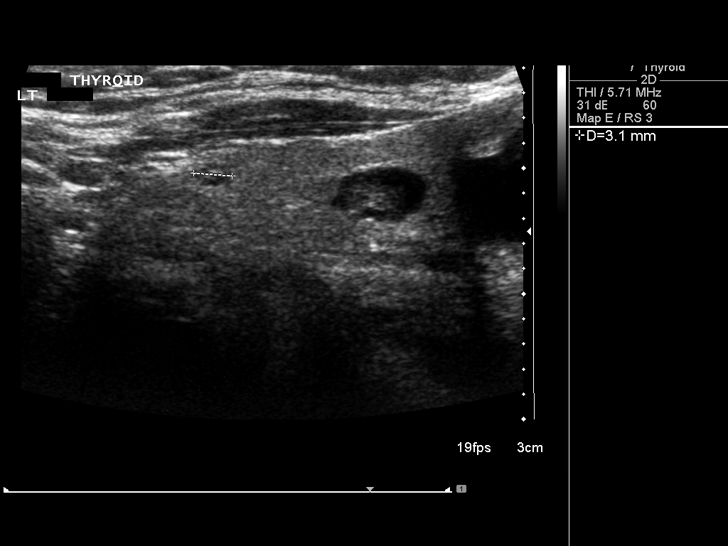
[im 32/43]
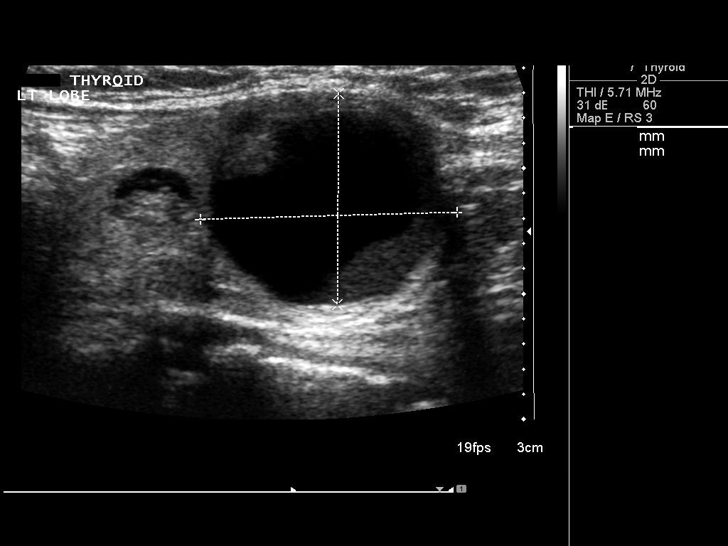
[im 36/43]
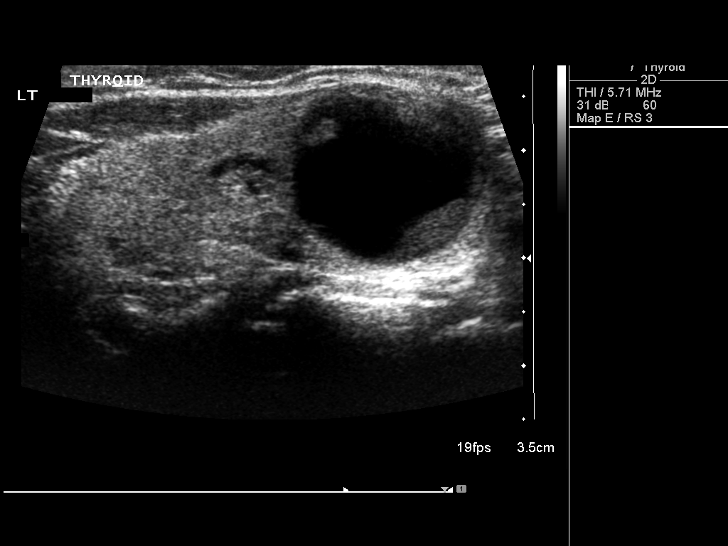
[im 39/43]
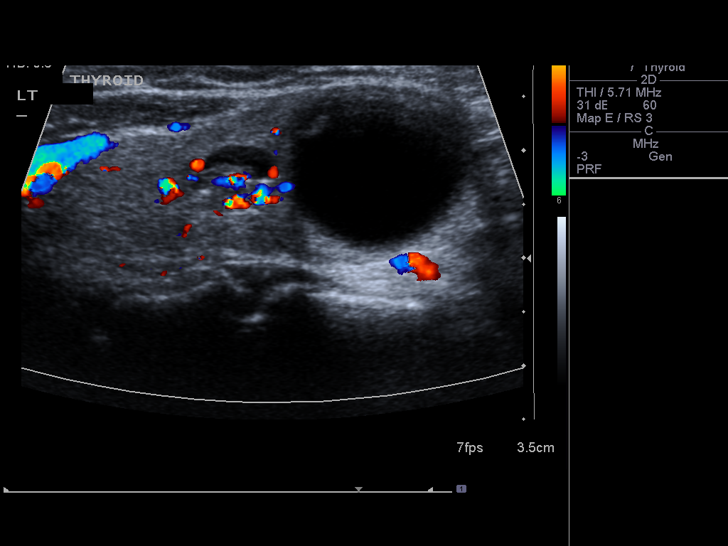
[im 43/43]
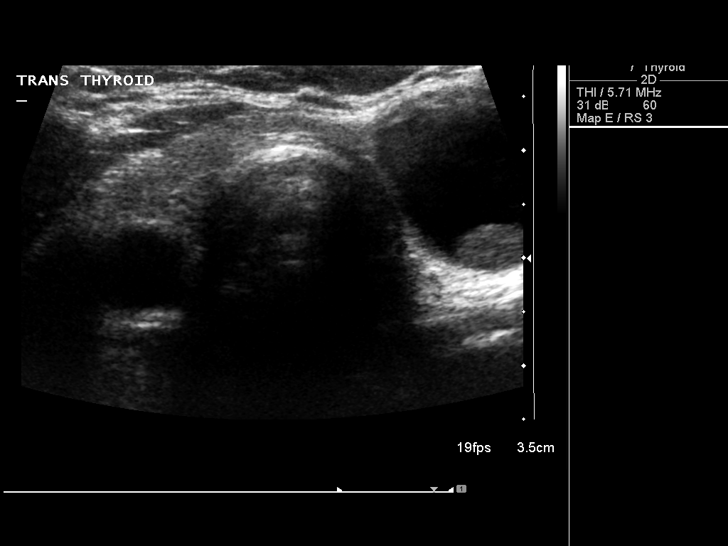

[14 of 25 positions shown; findings below may reference images not displayed]

FINDINGS: The right lobe measures 3.9 x 1.2 x 1.30 cm.

The left lobe measures 4.3 x 1.9 x 1.7 cm.

The isthmus measures 2.0 mm.

The thyroid echotexture is homogeneous.

Within the inferior pole of the right lobe there is a 3 mm
hypoechoic nodule.

A 3 mm hypoechoic nodule within the upper pole of the left lobe is
identified.

Also within the upper pole there is an 8 x 5 x 9 mm cystic and
solid nodule containing internal areas of microcalcification.

Within the inferior pole the left lobe there is a 2 x 1.7 x 2.1 cm
complex nodule which is predominately cystic with peripheral areas
of mural nodularity.
IMPRESSION: The cystic and solid nodule involving the inferior pole of the left
lobe of the thyroid gland has size and sonographic characteristics
indicating percutaneous fine needle aspiration to rule out primary
thyroid malignancy.

## 2011-09-20 IMAGING — US US BIOPSY
1 series · 14 of 16 positions shown · non-contrast
Comparison: 01/14/2010

CLINICAL DATA: Left lower pole dominant thyroid complex cystic
nodule

ULTRASOUND-GUIDED NEEDLE ASPIRATE BIOPSY, LEFT LOBE OF THYROID
The above procedure was discussed with the patient and written
informed consent was obtained.

[Series 1: us biopsy · 0.06mm/px · 16 acquisitions, 14 frames shown]
[im 1/16]
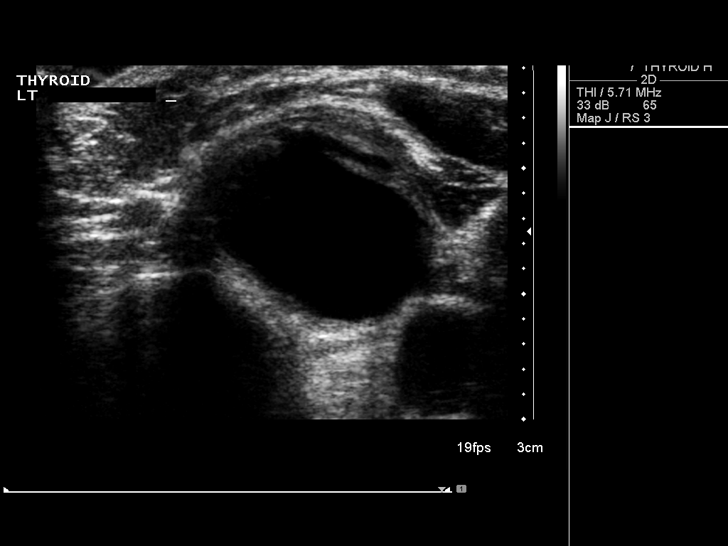
[im 2/16]
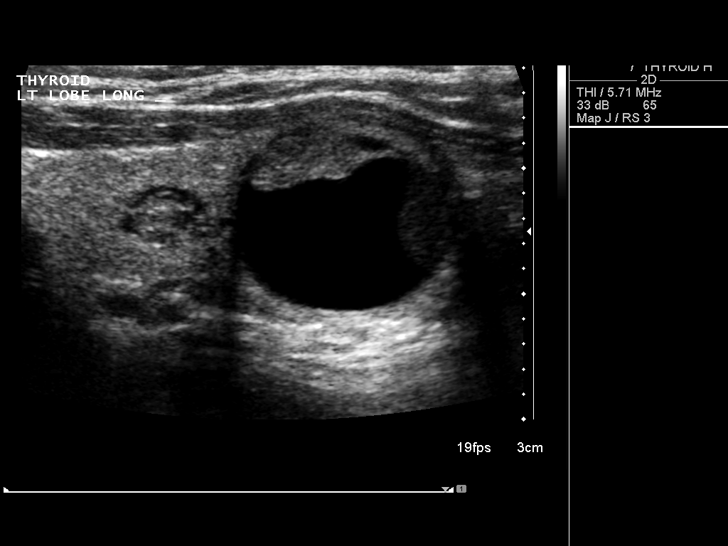
[im 3/16]
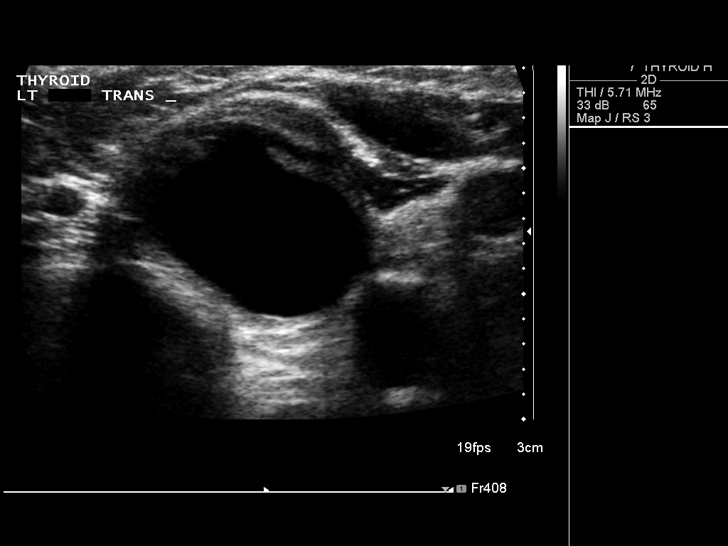
[im 5/16]
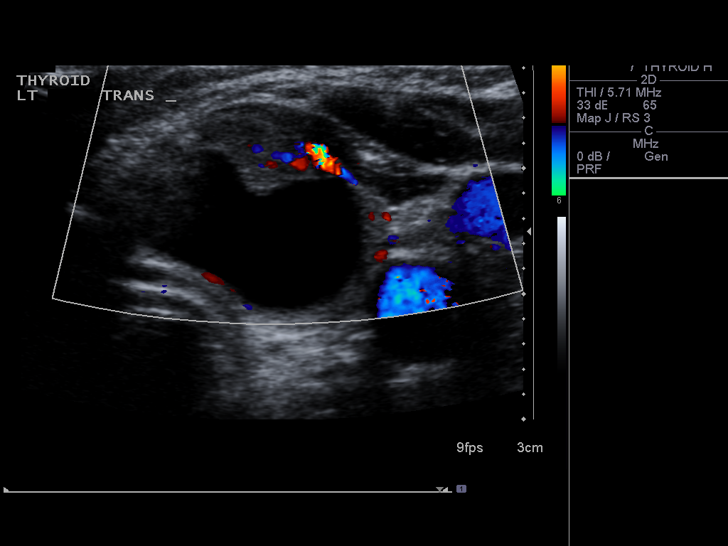
[im 6/16]
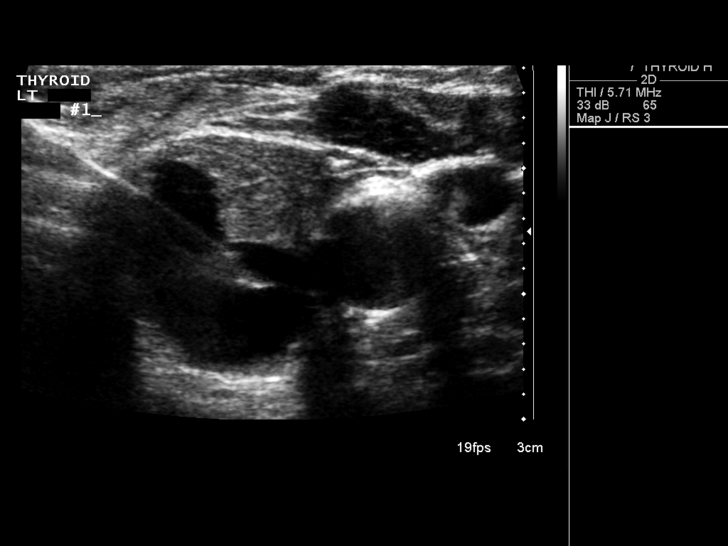
[im 7/16]
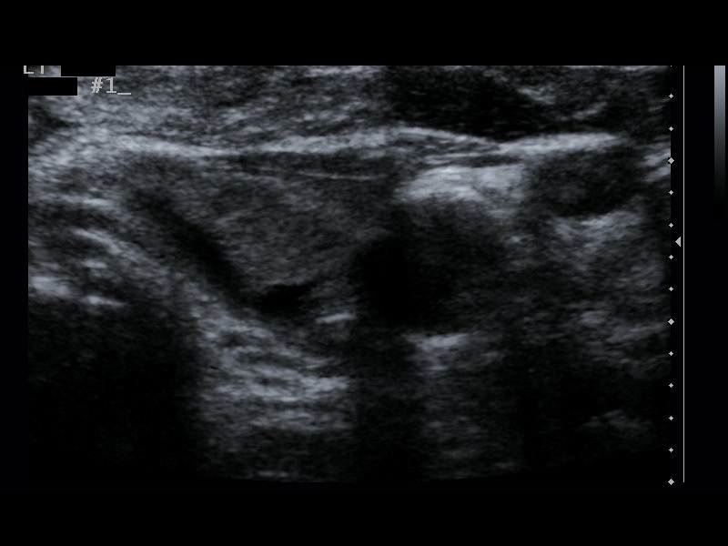
[im 8/16]
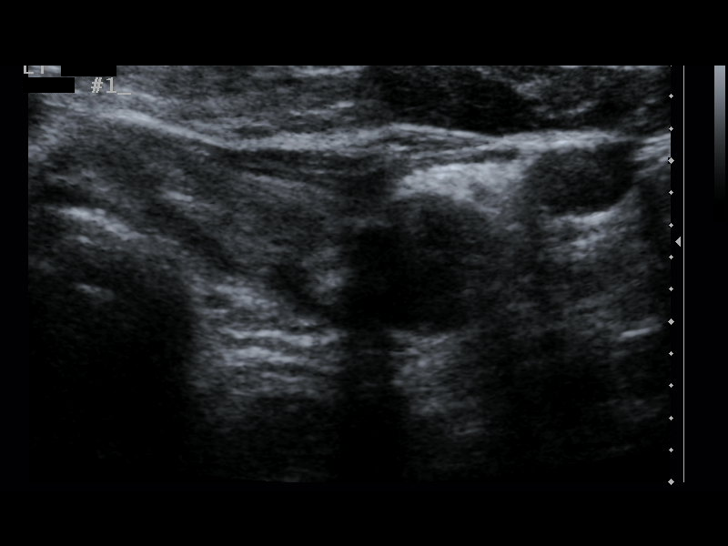
[im 9/16]
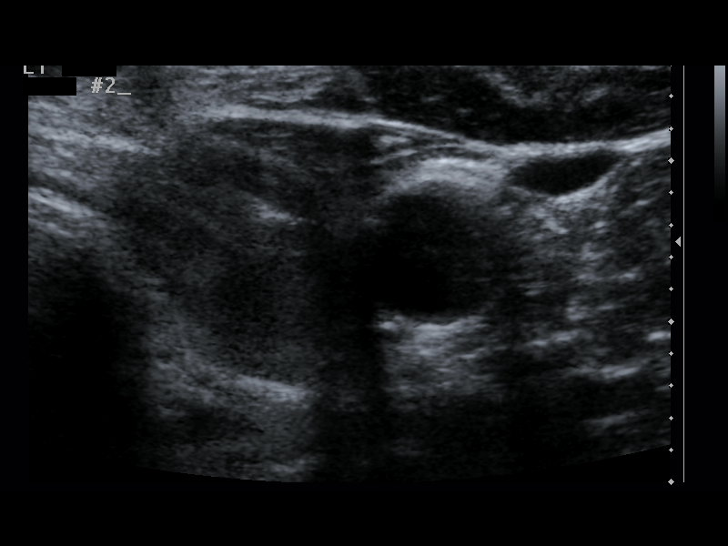
[im 10/16]
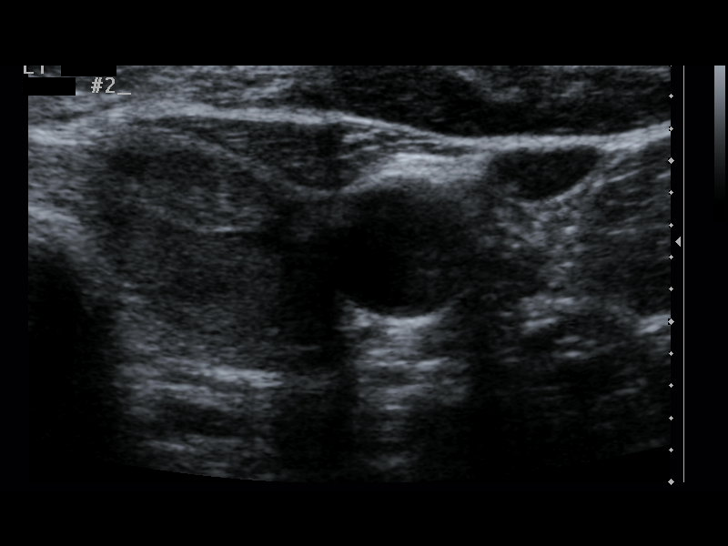
[im 11/16]
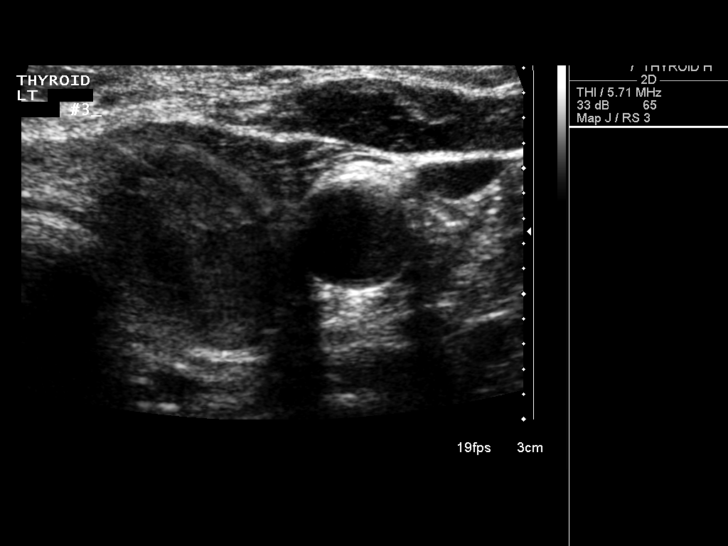
[im 13/16]
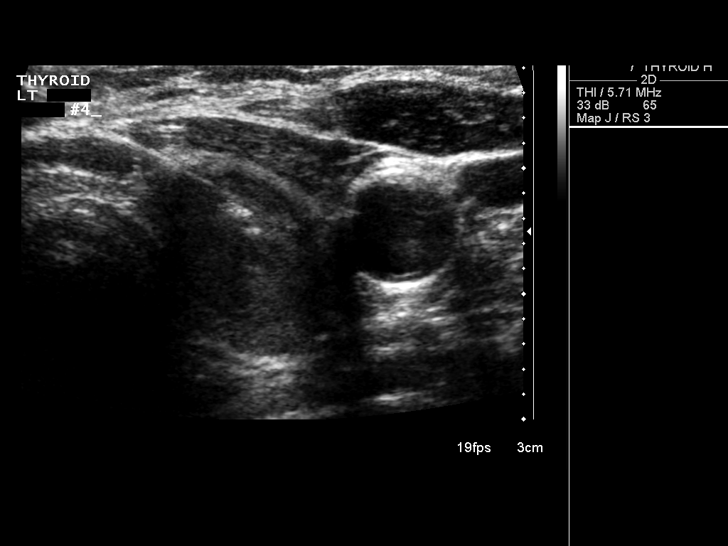
[im 14/16]
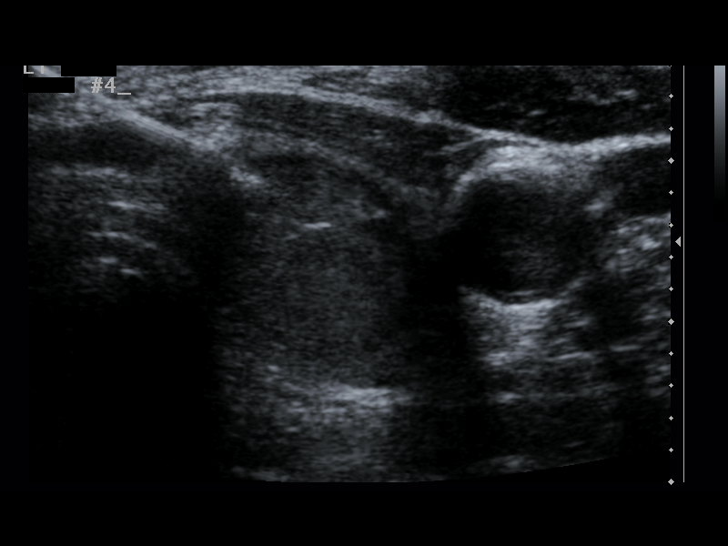
[im 15/16]
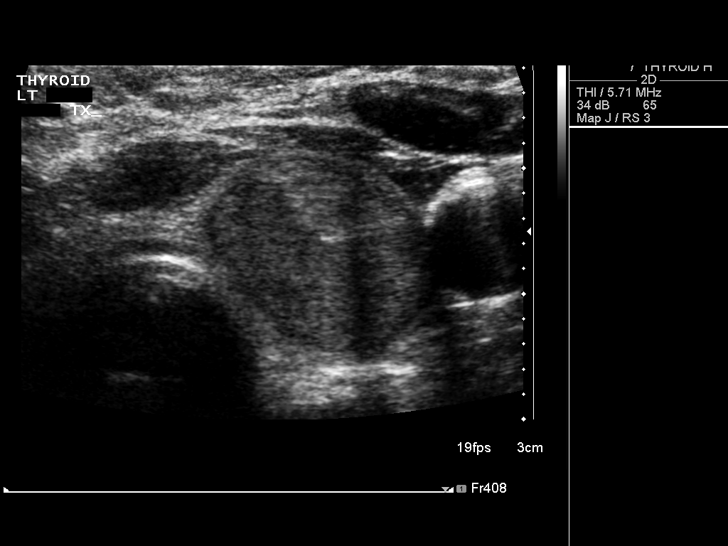
[im 16/16]
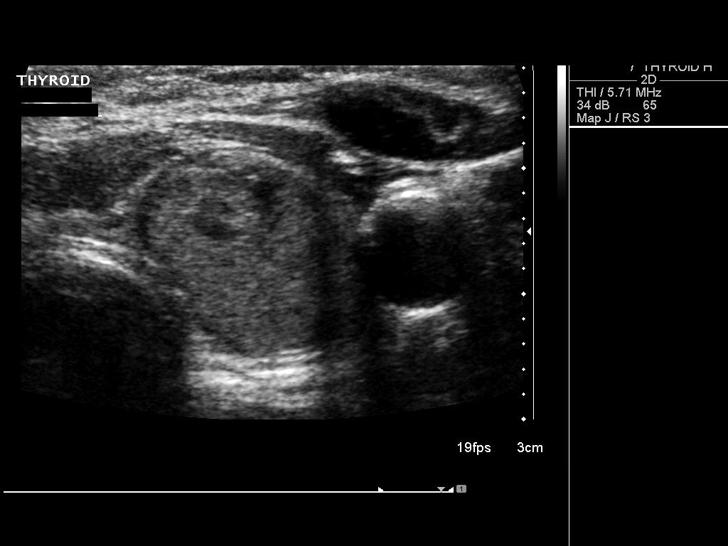

[14 of 16 positions shown; findings below may reference images not displayed]

FINDINGS: Ultrasound was performed to localize and mark an adequate
site for the biopsy.  The patient was then prepped and draped in a
normal sterile fashion.  Local anesthesia was provided with 1%
lidocaine.  Using direct ultrasound guidance, 4 passes were made
using 25 gauge needles into the nodule within the left lobe of the
thyroid.  Ultrasound was used to confirm needle placements on all
occasions.  Specimens were sent to Pathology for analysis.  Post
procedural imaging demonstrated no hematoma or immediate
complication.  The patient tolerated the procedure well.
IMPRESSION: Ultrasound dominant left lower thyroid complex cystic nodule FNA
biopsy

## 2011-10-12 ENCOUNTER — Telehealth: Payer: Self-pay | Admitting: Cardiology

## 2011-10-12 NOTE — Telephone Encounter (Signed)
New problem:  Patient calling, pt wants to know have she ever had pertussin shot

## 2011-10-12 NOTE — Telephone Encounter (Signed)
Discussed with patient and she is going to contact hospital to see if anything in the records there.

## 2011-10-20 ENCOUNTER — Encounter: Payer: Self-pay | Admitting: Cardiology

## 2011-12-06 ENCOUNTER — Other Ambulatory Visit (INDEPENDENT_AMBULATORY_CARE_PROVIDER_SITE_OTHER): Payer: Medicare Other

## 2011-12-06 DIAGNOSIS — E7800 Pure hypercholesterolemia, unspecified: Secondary | ICD-10-CM

## 2011-12-06 LAB — BASIC METABOLIC PANEL
CO2: 27 mEq/L (ref 19–32)
Calcium: 8.8 mg/dL (ref 8.4–10.5)
Creatinine, Ser: 0.7 mg/dL (ref 0.4–1.2)
GFR: 93.49 mL/min (ref >60.00)
Glucose, Bld: 99 mg/dL (ref 70–99)
Sodium: 140 mEq/L (ref 135–145)

## 2011-12-06 LAB — HEPATIC FUNCTION PANEL
Albumin: 4.1 g/dL (ref 3.5–5.2)
Alkaline Phosphatase: 51 U/L (ref 39–117)
Total Protein: 7 g/dL (ref 6.0–8.3)

## 2011-12-06 LAB — LIPID PANEL
HDL: 52.1 mg/dL (ref >39.00)
Triglycerides: 256 mg/dL — ABNORMAL HIGH (ref 0.0–149.0)
VLDL: 51.2 mg/dL — ABNORMAL HIGH (ref 0.0–40.0)

## 2011-12-06 NOTE — Progress Notes (Signed)
Quick Note:  Please make copy of labs for patient visit. ______ 

## 2011-12-11 ENCOUNTER — Ambulatory Visit: Payer: Medicare Other | Admitting: Cardiology

## 2011-12-19 ENCOUNTER — Ambulatory Visit (INDEPENDENT_AMBULATORY_CARE_PROVIDER_SITE_OTHER): Payer: Medicare Other | Admitting: Cardiology

## 2011-12-19 ENCOUNTER — Encounter: Payer: Self-pay | Admitting: Cardiology

## 2011-12-19 DIAGNOSIS — I119 Hypertensive heart disease without heart failure: Secondary | ICD-10-CM

## 2011-12-19 DIAGNOSIS — E785 Hyperlipidemia, unspecified: Secondary | ICD-10-CM

## 2011-12-19 DIAGNOSIS — E7800 Pure hypercholesterolemia, unspecified: Secondary | ICD-10-CM

## 2011-12-19 DIAGNOSIS — I10 Essential (primary) hypertension: Secondary | ICD-10-CM

## 2011-12-19 DIAGNOSIS — E039 Hypothyroidism, unspecified: Secondary | ICD-10-CM

## 2011-12-19 NOTE — Assessment & Plan Note (Signed)
The patient complains of lack of energy.  She does not feel like doing anything.  She has gotten out of the habit of getting regular exercise.  She will have her thyroid levels checked when she sees her endocrinologist in April.  She remains on a vegetarian diet.

## 2011-12-19 NOTE — Progress Notes (Signed)
Darlene Conway Date of Birth:  01-23-1941 [facility] [address] [place], [place]  [address] [phone]         Fax   [phone]  History of Present Illness: This pleasant 71 year old widowed woman is seen for a scheduled four-month followup office visit.  She is a past history of familial hypercholesterolemia and a history of essential hypertension.  She has a remote history of breast cancer in 2001.  More recently she has had surgery for thyroid cancer.  Her thyroid condition is followed by Dr. Claiborne Rigg at [facility] whom she will stay on April 9.  The patient complains of lack of energy.  He has not been expressing any chest pain or angina.  She has not been getting a regular exercise.  Current Outpatient Prescriptions  Medication Sig Dispense Refill  . Ascorbic Acid (VITAMIN C PO) Take 1 tablet by mouth daily.       Marland Kitchen aspirin 81 MG tablet Take 81 mg by mouth at bedtime.        . AZOR 10-20 MG per tablet TAKE 1 TABLET BY MOUTH DAILY  30 tablet  PRN  . Calcium Carbonate-Vitamin D 600-400 MG-UNIT per tablet Take 1 tablet by mouth daily.      . Cholecalciferol (VITAMIN D PO) Take 5,000 mg by mouth daily.       Marland Kitchen levothyroxine (SYNTHROID, LEVOTHROID) 112 MCG tablet Take 0.125 mcg by mouth daily.       . Multiple Vitamins-Minerals (ALIVE WOMENS 50+ PO) Take by mouth daily.      . Omega-3 Fatty Acids (OMEGA 3 PO) Take 1 capsule by mouth daily.       Marland Kitchen PROBIOTIC CAPS Take 1 capsule by mouth daily. Once or twice daily        Allergies  Allergen Reactions  . Crestor (Rosuvastatin Calcium)     Palp.  . Levaquin   . Lipitor (Atorvastatin Calcium)   . Meclizine     Very bad reaction  . Niacin And Related   . Phenergan   . Sulfa Antibiotics     Patient Active Problem List  Diagnoses  . Hypertension  . Hyperlipidemia  . Anxiety  . History of dizziness  . Hypothyroidism  . Nausea    History  Smoking status  . Former Smoker  Smokeless tobacco  .  Not on file    History  Alcohol Use No    Family History  Problem Relation Age of Onset  . Heart attack Father   . Heart disease      family positive for  . Hypertension Brother   . Hypertension Brother   . Breast cancer Sister     Review of Systems: Constitutional: no fever chills diaphoresis or fatigue or change in weight.  Head and neck: no hearing loss, no epistaxis, no photophobia or visual disturbance. Respiratory: No cough, shortness of breath or wheezing. Cardiovascular: No chest pain peripheral edema, palpitations. Gastrointestinal: No abdominal distention, no abdominal pain, no change in bowel habits hematochezia or melena. Genitourinary: No dysuria, no frequency, no urgency, no nocturia. Musculoskeletal:No arthralgias, no back pain, no gait disturbance or myalgias. Neurological: No dizziness, no headaches, no numbness, no seizures, no syncope, no weakness, no tremors. Hematologic: No lymphadenopathy, no easy bruising. Psychiatric: No confusion, no hallucinations, no sleep disturbance.    Physical Exam: Filed Vitals:   12/19/11 1611  BP: 114/80  Pulse: 68   the general appearance reveals a well-developed well-nourished woman in no distress.The  head and neck exam reveals pupils equal and reactive.  Extraocular movements are full.  There is no scleral icterus.  The mouth and pharynx are normal.  The neck is supple.  The carotids reveal no bruits.  The jugular venous pressure is normal.  The  thyroid is not enlarged.  There is no lymphadenopathy.  The chest is clear to percussion and auscultation.  There are no rales or rhonchi.  Expansion of the chest is symmetrical.  The precordium is quiet.  The first heart sound is normal.  The second heart sound is physiologically split.  There is no murmur gallop rub or click.  There is no abnormal lift or heave.  The abdomen is soft and nontender.  The bowel sounds are normal.  The liver and spleen are not enlarged.  There are no  abdominal masses.  There are no abdominal bruits.  Extremities reveal good pedal pulses.  There is no phlebitis or edema.  There is trace ankle edema bilaterally.  There is no cyanosis or clubbing.  Strength is normal and symmetrical in all extremities.  There is no lateralizing weakness.  There are no sensory deficits.  The skin is warm and dry.  There is no rash.     Assessment / Plan: Continue same medication.  Recheck in 4 months for followup office visit lipid panel hepatic function panel and basal metabolic panel.  I have urged her to get back into some type of regular exercise program.  She may want to work with a trainer again as she has done in the past.

## 2011-12-19 NOTE — Patient Instructions (Signed)
Your physician recommends that you continue on your current medications as directed. Please refer to the Current Medication list given to you today. Your physician wants you to follow-up in: 4 months You will receive a reminder letter in the mail two months in advance. If you don't receive a letter, please call our office to schedule the follow-up appointment.  

## 2011-12-19 NOTE — Assessment & Plan Note (Signed)
Her cholesterol and LDL cholesterol remains very high.  She is intolerant of statins.  Since last visit she has gained 6 pounds

## 2011-12-19 NOTE — Assessment & Plan Note (Signed)
Her blood pressure has been maintaining in the satisfactory range on current therapy.

## 2012-03-01 ENCOUNTER — Telehealth: Payer: Self-pay | Admitting: Cardiology

## 2012-03-01 NOTE — Telephone Encounter (Signed)
Gave her Dr Diamantina Monks name

## 2012-03-01 NOTE — Telephone Encounter (Signed)
New problem:  Patient calling would like to discuss primary care.

## 2012-04-25 ENCOUNTER — Telehealth: Payer: Self-pay | Admitting: Cardiology

## 2012-04-25 NOTE — Telephone Encounter (Signed)
Will forward to [place].  Pt wants her records sent to her new PCP however, she cannot remember what her name is.  She said it is a female MD not part of [facility].

## 2012-04-25 NOTE — Telephone Encounter (Signed)
New problem:   Aware Darlene Conway is off today. Trying to get records sent to PCP. Patient stated she drop records to [facility]. Dena spoke with patient advising she would need to call the office an someone will take a  Message for the nurse. Patient never disclosed she wanted any records fax over .

## 2012-05-07 ENCOUNTER — Telehealth: Payer: Self-pay | Admitting: Cardiology

## 2012-05-07 NOTE — Telephone Encounter (Signed)
Please return call to patient at [phone] regarding medical questions.

## 2012-05-08 NOTE — Telephone Encounter (Signed)
Faxing records

## 2012-05-08 NOTE — Telephone Encounter (Signed)
Calling about records being faxed, advised if they didn't receive call back

## 2012-05-27 ENCOUNTER — Other Ambulatory Visit: Payer: Self-pay | Admitting: Cardiology

## 2012-10-10 ENCOUNTER — Encounter: Payer: Self-pay | Admitting: Cardiology

## 2012-10-12 IMAGING — CR DG CHEST 2V
2 series · 2 of 2 positions shown · non-contrast
Comparison: 08/28/2005

CLINICAL DATA: Dizziness and weakness.  Fever

CHEST - 2 VIEW

[w chest pa]
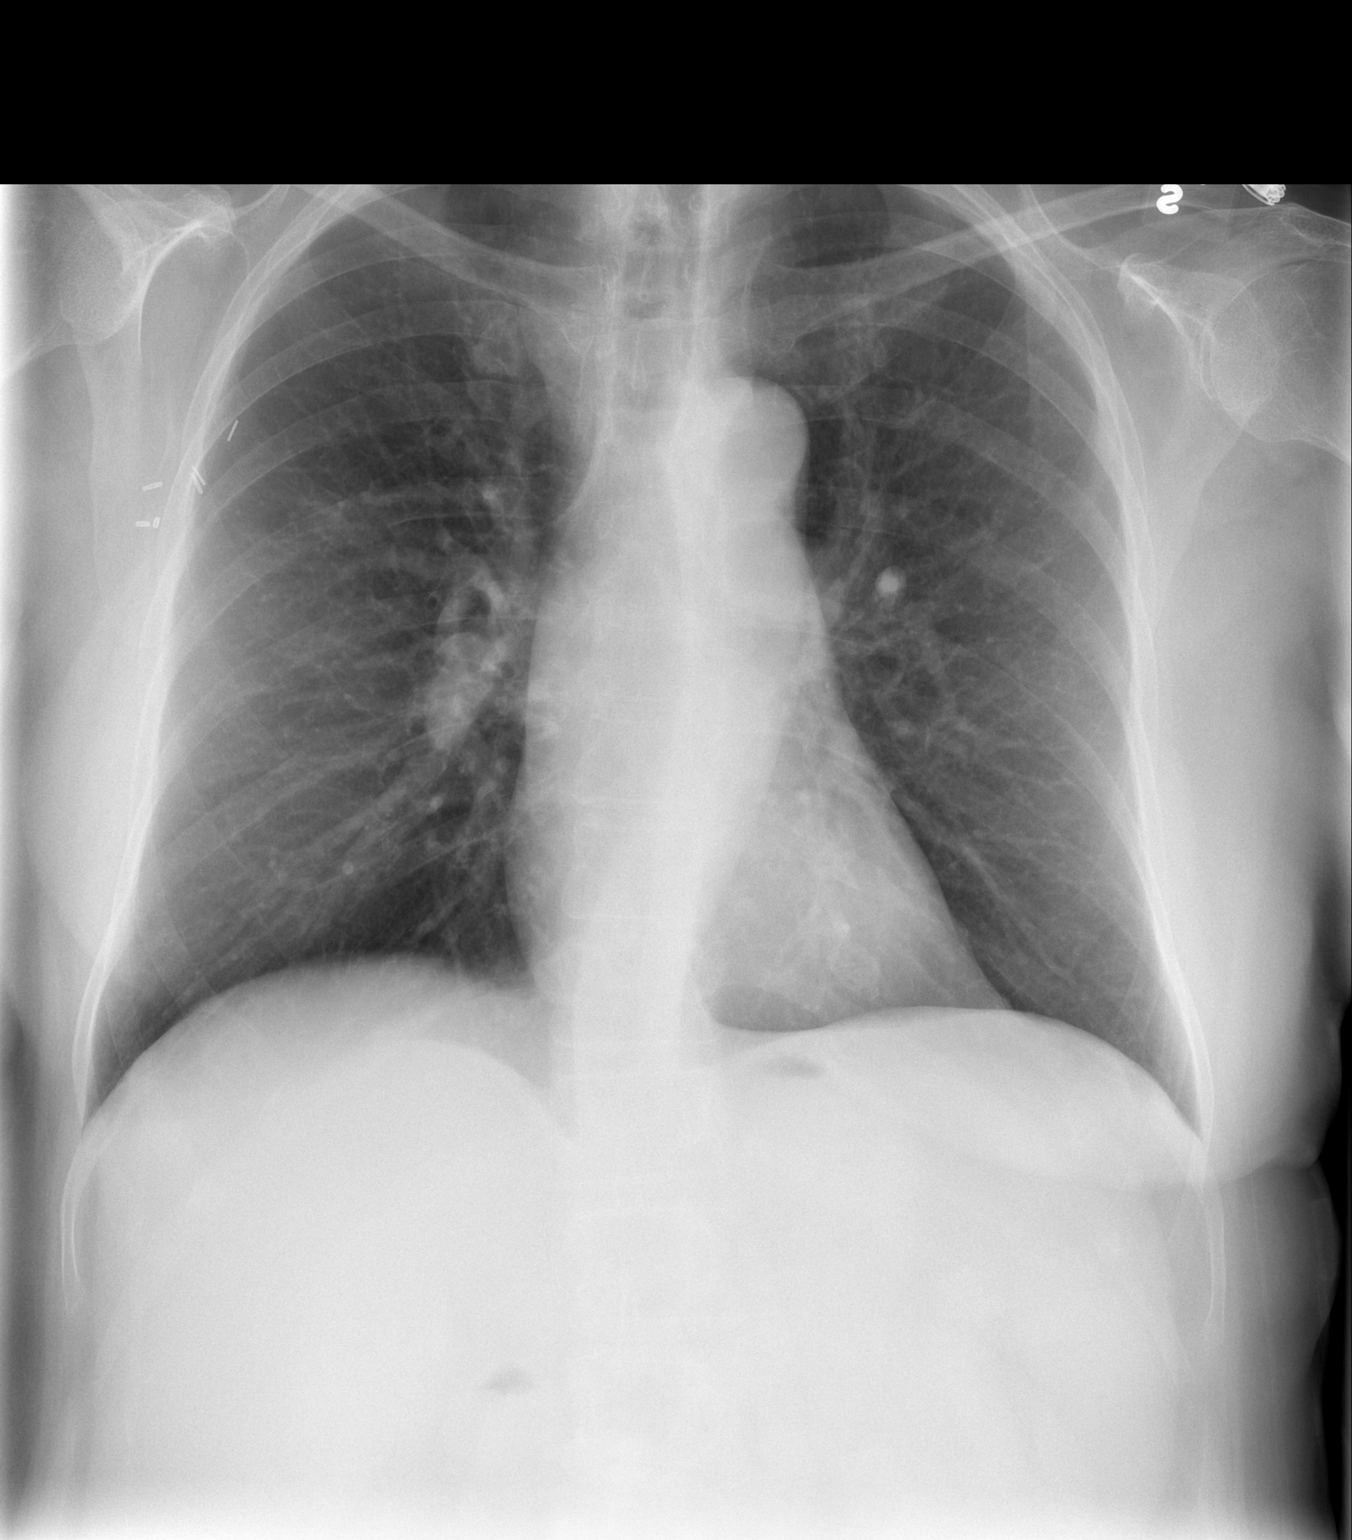

[w chest lat]
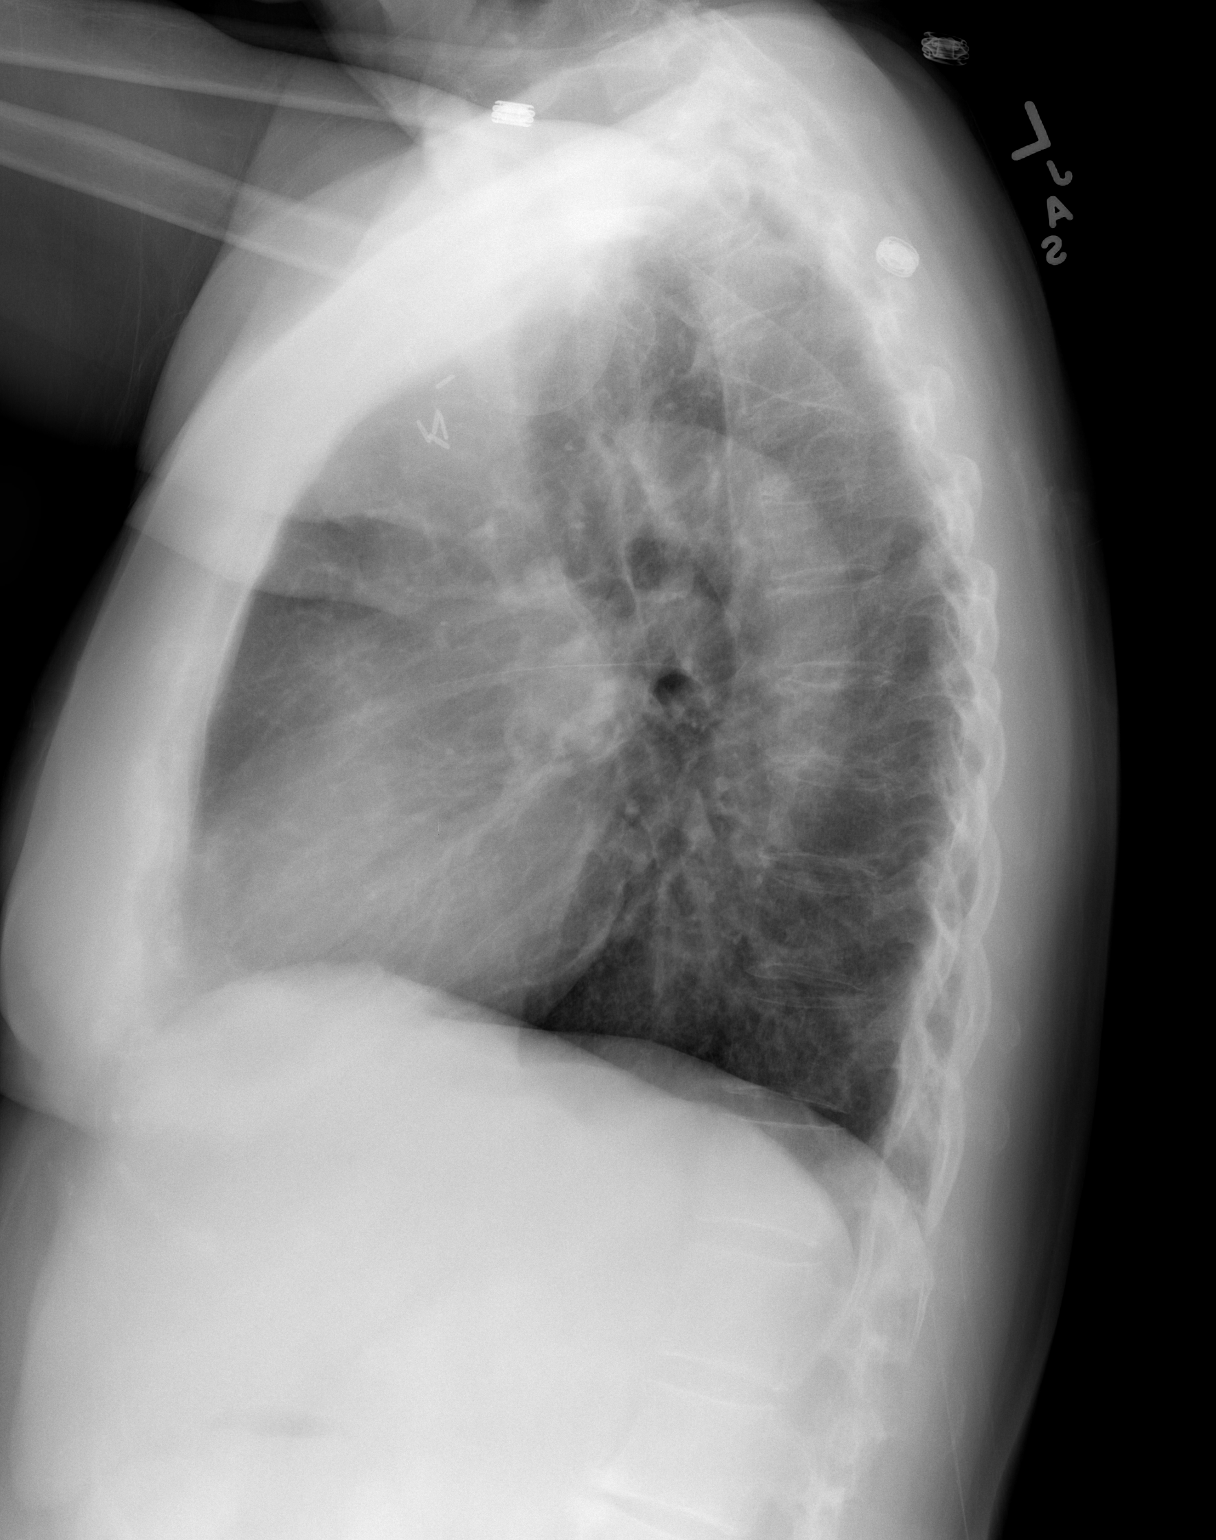

[2 of 2 positions shown; findings below may reference images not displayed]

FINDINGS: Heart size is normal.  Pulmonary vascularity normal.
Negative for infiltrate or effusion.  Right mastectomy has been
performed.
IMPRESSION: No active cardiopulmonary disease.

## 2012-10-18 ENCOUNTER — Other Ambulatory Visit: Payer: Self-pay | Admitting: Dermatology

## 2013-01-30 ENCOUNTER — Encounter: Payer: Self-pay | Admitting: Cardiology

## 2013-03-16 ENCOUNTER — Encounter: Payer: Self-pay | Admitting: Cardiology

## 2013-04-03 ENCOUNTER — Encounter: Payer: Self-pay | Admitting: Cardiology

## 2013-04-26 ENCOUNTER — Encounter: Payer: Self-pay | Admitting: Cardiology

## 2013-06-05 ENCOUNTER — Other Ambulatory Visit: Payer: Self-pay | Admitting: Cardiology

## 2013-06-22 ENCOUNTER — Other Ambulatory Visit: Payer: Self-pay | Admitting: Cardiology

## 2013-07-09 ENCOUNTER — Other Ambulatory Visit: Payer: Self-pay | Admitting: Cardiology

## 2013-08-25 ENCOUNTER — Ambulatory Visit: Payer: Medicare Other | Admitting: Cardiology

## 2013-09-06 ENCOUNTER — Other Ambulatory Visit: Payer: Self-pay | Admitting: Cardiology

## 2013-09-22 ENCOUNTER — Ambulatory Visit: Payer: Medicare Other | Admitting: Cardiology

## 2013-10-07 ENCOUNTER — Encounter: Payer: Self-pay | Admitting: General Surgery

## 2013-10-07 DIAGNOSIS — G4733 Obstructive sleep apnea (adult) (pediatric): Secondary | ICD-10-CM | POA: Insufficient documentation

## 2013-10-23 ENCOUNTER — Ambulatory Visit (INDEPENDENT_AMBULATORY_CARE_PROVIDER_SITE_OTHER): Payer: Medicare Other | Admitting: Cardiology

## 2013-10-23 ENCOUNTER — Encounter: Payer: Self-pay | Admitting: Cardiology

## 2013-10-23 VITALS — BP 120/96 | HR 72 | Ht 63.0 in | Wt 148.0 lb

## 2013-10-23 DIAGNOSIS — I10 Essential (primary) hypertension: Secondary | ICD-10-CM

## 2013-10-23 DIAGNOSIS — G4733 Obstructive sleep apnea (adult) (pediatric): Secondary | ICD-10-CM

## 2013-10-23 DIAGNOSIS — K5790 Diverticulosis of intestine, part unspecified, without perforation or abscess without bleeding: Secondary | ICD-10-CM | POA: Insufficient documentation

## 2013-10-23 NOTE — Progress Notes (Signed)
[address], [place], [place]  [address] Phone: [phone] Fax:  [phone]  Date:  10/23/2013   ID:  Simone, Rodenbeck [birth year], MRN [id number]  PCP:  Darlin Coco, MD  Sleep Medicine:  Fransico Him, MD   History of Present Illness: Darlene Conway is a 73 y.o. female with a history of moderate OSA and HTN who presents today for followup.  She is having some problems with her CPAP therapy.  She hurt her knee and was using a cane and had to take multiple flights over the past 5 weeks and decided that she could not take her device with her.  She is now having problems getting back into using it.  She received a new mask in the mail and is concerned that it does not fit her.  She went to [place] [facility] to have them look at the mask and she was told they could not do it.  She otherwise tolerates her device without any problems.  She tolerates her mask and feels the pressure is adequate.  She feels rested in the am and has no daytime sleepiness.   Wt Readings from Last 3 Encounters:  12/19/11 151 lb 9.6 oz (68.765 kg)  06/21/11 145 lb (65.772 kg)  02/23/11 144 lb (65.318 kg)     Past Medical History  Diagnosis Date  . Hypertension   . Hyperlipidemia   . Thyroid disease     [place] in the mid 1970's --   . Cancer 2001    breast cancer in right breast  . Thyroid carcinoma 03/08/10    surgery at the [facility] and a 2 cm left thyroid nodule was removed and the was a multifocal disease in the right lobe  the pathology indicated papillary thyroid carcinoma / entire thyroid gland as been removed now  . History of dizziness   . Osteoporosis   . History of peritonitis     post appendicitis  . Hypokalemia   . Anxiety   . History of migraine headaches   . OSA (obstructive sleep apnea)   . Diverticulosis     Current Outpatient Prescriptions  Medication Sig Dispense Refill  . Ascorbic Acid (VITAMIN C PO) Take 1 tablet by mouth daily.       Marland Kitchen  aspirin 81 MG tablet Take 81 mg by mouth at bedtime.        . AZOR 10-20 MG per tablet TAKE 1 TABLET BY MOUTH EVERY DAY  30 tablet  0  . Calcium Carbonate-Vitamin D 600-400 MG-UNIT per tablet Take 1 tablet by mouth daily.      . Cholecalciferol (VITAMIN D PO) Take 5,000 mg by mouth daily.       Marland Kitchen levothyroxine (SYNTHROID, LEVOTHROID) 112 MCG tablet Take 0.125 mcg by mouth daily.       . Multiple Vitamins-Minerals (ALIVE WOMENS 50+ PO) Take by mouth daily.      . Omega-3 Fatty Acids (OMEGA 3 PO) Take 1 capsule by mouth daily.       Marland Kitchen PROBIOTIC CAPS Take 1 capsule by mouth daily. Once or twice daily      . [DISCONTINUED] calcium carbonate (OS-CAL) 600 MG TABS Take 600 mg by mouth daily.        No current facility-administered medications for this visit.    Allergies:    Allergies  Allergen Reactions  . Crestor [Rosuvastatin Calcium]     Palp.  . Levofloxacin   . Lipitor [employer]  Calcium]   . Meclizine     Very bad reaction  . Niacin And Related   . Promethazine Hcl   . Sulfa Antibiotics     Social History:  The patient  reports that she has quit smoking. She does not have any smokeless tobacco history on file. She reports that she does not drink alcohol or use illicit drugs.   Family History:  The patient's family history includes Breast cancer in her sister; Heart attack in her father; Heart disease in an other family member; Hypertension in her brother and brother.   ROS:  Please see the history of present illness.      All other systems reviewed and negative.   PHYSICAL EXAM: VS:  There were no vitals taken for this visit. Well nourished, well developed, in no acute distress HEENT: normal Neck: no JVD Cardiac:  normal S1, S2; RRR; no murmur Lungs:  clear to auscultation bilaterally, no wheezing, rhonchi or rales Abd: soft, nontender, no hepatomegaly Ext: no edema Skin: warm and dry Neuro:  CNs 2-12 intact, no focal abnormalities noted       ASSESSMENT AND  PLAN:  1. Moderate OSA on CPAP.  Her download today showed an AHI of 1/hr on 7cm H2O but poor compliance due to not using it recently. 2. HTN - mildly elevated DBP.  At home her BP rund 120/37mMHg  - continue Azor    Followup with me in  6 months  Signed, Fransico Him, MD 10/23/2013 10:45 AM

## 2013-10-23 NOTE — Patient Instructions (Addendum)
Your physician recommends that you continue on your current medications as directed. Please refer to the Current Medication list given to you today.  We are ordering a Airfit P10 Mask Only ANDY to fit for Pt.   Jonni Sanger will not be in the office until 11:30 today. I will call him and ask him to call you about fitting your mask. If you do not hear from him in a week call me back.  Your physician wants you to follow-up in: 6 Months with Dr Mallie Snooks will receive a reminder letter in the mail two months in advance. If you don't receive a letter, please call our office to schedule the follow-up appointment.

## 2014-04-30 ENCOUNTER — Telehealth: Payer: Self-pay | Admitting: Cardiology

## 2014-04-30 NOTE — Telephone Encounter (Signed)
lmtrc

## 2014-04-30 NOTE — Telephone Encounter (Signed)
I would recommend that she get set up with someone in [place] when she gets out there since she has not been using the CPAP for a while.

## 2014-04-30 NOTE — Telephone Encounter (Signed)
New Message  Pt called states that she is moving to [place], [place] and she is concerned that she is not using the CPAP properly//Please call pt back to discuss.

## 2014-04-30 NOTE — Telephone Encounter (Signed)
Pt called and stated that she is moving and was told by her PCP she should see [place] before she leaves. She did not call in June to schedule 6 month f/u we do not have anything until September. She said her CPAP is not working right with her and she can stand it. I said she should bring her SD card in so we can get a download to support that it isn't working for her. She said well that would be a problem cause she hasn't been using it. I asked how long and she said forever. I asked how long she used the machine after getting it. She stated only a few weeks. I told her she should have called [place] then and we could have fixed it then. She stated she did. I looked back and did not see a telephone encounter that she called. She stated she went to [facility] unhappy with pressure and mask. I told her I would talk with Dr Radford Pax. I set her up with first appt in September available. She is moving in September though to [place]. Also sleep lab is booked out until almost October. To Dr Radford Pax to advise.

## 2014-04-30 NOTE — Telephone Encounter (Signed)
Pt is aware.  

## 2014-06-26 ENCOUNTER — Telehealth: Payer: Self-pay | Admitting: Cardiology

## 2014-06-26 NOTE — Telephone Encounter (Signed)
lmtrc

## 2014-06-26 NOTE — Telephone Encounter (Signed)
New message          Pt would like to know why she was called for an appt when there is nothing until Nov / pt had an appt for 9/23 but cancelled because she had a conflict , offered pt next avail which is 11/2 pt declined , I also offered pt NP or PA and she declined as well / please give pt a call

## 2014-06-26 NOTE — Telephone Encounter (Signed)
Follow up:    Pt returned your call please give her a call back.

## 2014-06-26 NOTE — Telephone Encounter (Signed)
R/s appt for pt to 9.22.15

## 2014-06-30 ENCOUNTER — Telehealth: Payer: Self-pay | Admitting: General Surgery

## 2014-06-30 ENCOUNTER — Ambulatory Visit: Payer: Medicare Other | Admitting: Cardiology

## 2014-06-30 NOTE — Telephone Encounter (Signed)
Pt showed to her appt today after me confirming time on 9/18 and Geslia confirming appt and time on 9/21 with pt. Pt stated she did not know we were in this new building. Pt was seen in our office in January. We explained being 40 minutes late we could not see her per Dr Radford Pax  Will send to Dr Radford Pax as Juluis Rainier about above and ok not seeing pt and that she needs to r/s appt.

## 2014-07-01 ENCOUNTER — Ambulatory Visit: Payer: Medicare Other | Admitting: Cardiology

## 2014-08-19 DIAGNOSIS — E785 Hyperlipidemia, unspecified: Secondary | ICD-10-CM | POA: Insufficient documentation

## 2014-08-19 DIAGNOSIS — C73 Malignant neoplasm of thyroid gland: Secondary | ICD-10-CM | POA: Insufficient documentation

## 2014-08-19 DIAGNOSIS — I10 Essential (primary) hypertension: Secondary | ICD-10-CM | POA: Insufficient documentation

## 2014-08-19 DIAGNOSIS — C50919 Malignant neoplasm of unspecified site of unspecified female breast: Secondary | ICD-10-CM | POA: Insufficient documentation

## 2014-09-08 HISTORY — PX: TOTAL KNEE ARTHROPLASTY: SHX125

## 2014-10-13 DIAGNOSIS — M810 Age-related osteoporosis without current pathological fracture: Secondary | ICD-10-CM | POA: Insufficient documentation

## 2014-10-13 DIAGNOSIS — G4730 Sleep apnea, unspecified: Secondary | ICD-10-CM | POA: Insufficient documentation

## 2014-10-13 DIAGNOSIS — K5790 Diverticulosis of intestine, part unspecified, without perforation or abscess without bleeding: Secondary | ICD-10-CM | POA: Insufficient documentation

## 2014-10-21 DIAGNOSIS — Z96651 Presence of right artificial knee joint: Secondary | ICD-10-CM | POA: Insufficient documentation

## 2014-11-14 ENCOUNTER — Encounter: Payer: Self-pay | Admitting: Internal Medicine

## 2015-04-07 DIAGNOSIS — M2141 Flat foot [pes planus] (acquired), right foot: Secondary | ICD-10-CM | POA: Insufficient documentation

## 2015-04-07 DIAGNOSIS — M2142 Flat foot [pes planus] (acquired), left foot: Secondary | ICD-10-CM | POA: Insufficient documentation

## 2015-04-07 DIAGNOSIS — M216X1 Other acquired deformities of right foot: Secondary | ICD-10-CM | POA: Insufficient documentation

## 2015-04-28 ENCOUNTER — Telehealth (HOSPITAL_BASED_OUTPATIENT_CLINIC_OR_DEPARTMENT_OTHER): Payer: Self-pay

## 2015-04-28 NOTE — Telephone Encounter (Signed)
CONFIRMED PHONE NUMBER: [phone]  CALLERS FIRST AND LAST NAME: Inda Castle Mullet  FACILITY NAME: n/a TITLE: n/a  CALLERS RELATIONSHIP:Self  RETURN CALL: Detailed message on voicemail only     ([facility]) Texting is an option for this clinic. If you would like [place] to use this option, which mobile phone number should we text to if we are unable to reach you?    SUBJECT: General Message   REASON FOR REQUEST: Patient is calling to schedule a new patient appointment with Dr Hinton Lovely. States Dr Collins [place] Blomeier suggested that she request to see him. Patient had surgery in 2011 and had her thyroid and para thyroid removed due to cancer by Dr Alfredo Bach at the [facility].     MESSAGE: Please call patient to advise.

## 2015-05-13 DIAGNOSIS — E890 Postprocedural hypothyroidism: Secondary | ICD-10-CM | POA: Insufficient documentation

## 2015-05-13 NOTE — Telephone Encounter (Signed)
Callled patient and left VM that we will need office visit notes and any lab/imaging results, to give to Dr. Hinton Lovely to review.   Fax to [phone].

## 2015-05-19 ENCOUNTER — Encounter: Payer: Self-pay | Admitting: Internal Medicine

## 2015-05-31 ENCOUNTER — Telehealth (HOSPITAL_BASED_OUTPATIENT_CLINIC_OR_DEPARTMENT_OTHER): Payer: Self-pay | Admitting: "Endocrinology

## 2015-05-31 NOTE — Telephone Encounter (Signed)
(  TEXTING IS AN OPTION FOR [facility] CLINICS ONLY)  Is this a [place] clinic? No      RETURN CALL: Detailed message on voicemail only      SUBJECT:  General Message     REASON FOR REQUEST: Referral question    MESSAGE: Patient would like to be called when outside medical records have been received. Additionally, she only wants to be seen by Dr. Adonis Brook. (please see referral notes)  Please call caller back to advise. Thank you.

## 2015-10-21 ENCOUNTER — Ambulatory Visit: Payer: Medicare Other | Attending: "Endocrinology | Admitting: "Endocrinology

## 2015-10-21 ENCOUNTER — Encounter (HOSPITAL_BASED_OUTPATIENT_CLINIC_OR_DEPARTMENT_OTHER): Payer: Self-pay | Admitting: "Endocrinology

## 2015-10-21 VITALS — BP 135/87 | HR 89 | Wt 151.0 lb

## 2015-10-21 DIAGNOSIS — E785 Hyperlipidemia, unspecified: Secondary | ICD-10-CM | POA: Insufficient documentation

## 2015-10-21 DIAGNOSIS — E890 Postprocedural hypothyroidism: Secondary | ICD-10-CM

## 2015-10-21 DIAGNOSIS — C73 Malignant neoplasm of thyroid gland: Secondary | ICD-10-CM | POA: Insufficient documentation

## 2015-10-21 DIAGNOSIS — M810 Age-related osteoporosis without current pathological fracture: Secondary | ICD-10-CM

## 2015-10-21 DIAGNOSIS — I10 Essential (primary) hypertension: Secondary | ICD-10-CM

## 2015-10-21 NOTE — Progress Notes (Addendum)
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  She also has a history of osteoporosis.  She moved to the [place] area from [place] about one year ago and is here to establish an endocrine care.  She had the left thyroid nodule that was found incidentally on palpation following biopsy that she had complete thyroidectomy for a papillary thyroid carcinoma measuring 1.25 cm.  She has had surveillance testing for recurrence with an ultrasound in March 2012 that was essentially unremarkable and thyroglobulin that was undetectable in April 2015 with a normal TSH.  She has been on levothyroxine currently takes 1 tablet daily but does not know the dose her last dose by review of her records reveal that she was on 125 g daily with half tablet taken on Monday and Wednesdays.  She denies any symptoms of hyperthyroidism is fatigued but no other symptoms of hypothyroidism.  She denies any neck growth or compressive symptoms.  She was diagnosed with osteoporosis she believes in 2012 by screening bone mineral density testing.  I do not have those records but she was started on alendronate therapy since she also takes calcium and vitamin D.  She is never had a spontaneous fracture or an atraumatic fracture nor does she have a family history of such.  She denies any history of kidney stones..  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   HENT: Negative.    Eyes: Negative.    Respiratory: Negative.    Cardiovascular: Negative.    Gastrointestinal: Positive for diarrhea and constipation.   Genitourinary: Positive for urgency and frequency.   Musculoskeletal: Positive for joint pain.   Skin: Negative.    Neurological: Negative.    Psychiatric/Behavioral: Negative.        Patient Active  Problem List   Diagnosis   . Papillary thyroid carcinoma ([place])   . Osteoporosis   . Essential hypertension   . Dyslipidemia   . Postoperative hypothyroidism     No outpatient prescriptions prior to visit.     No facility-administered medications prior to visit.       FHx: Father with coronary artery disease mother died at age 80 1 sister died of complications of breast carcinoma 2 brothers with mild heart disease.    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    PSHx: Right total knee replacement, left breast lumpectomy, appendectomy, thyroidectomy     EXAM:  BP 135/87 mmHg  Pulse 89  Wt 151 lb (68.493 kg)  Physical Exam  CV  Auscultation  regular rhythm, no murmurs, no rubs, no gallops, Eyes  EOMI,PERRLA, Fundi normal, General  in no apparent distress, well developed and well nourished and kyphotic, HENT  NCAT, Oral mucosa moist, no lesions, Lungs  nl effort, clear to percussion and auscultation, Neck  Supple, no adenopathy, no abnormal masses, no asymmetry, no jugular venous distention, ROM normal, carotid upstrokes normal, Neurologic  diminished lower extremity strength unable to rise from a chair without using her upper extremities gait is antalgic, Musculoskeletal  positive findings: 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  Imaging:    ASSESSMENT AND PLAN:  Papillary thyroid carcinoma-stage I originally diagnosed in 2011.  No evidence of recurrence by surveillance last ultrasound was in 2014 last thyroglobulin in April 2015.  She is now on thyroid hormone replacement I will confirm dose with her over the phone.  Check TSH and free T4 as well as  thyroglobulin today  Order ultrasound if and no evidence of recurrence will repeat again in 3 years.  Adjust levothyroxine accordingly with goal TSH in the normal range, don't feel suppression is necessary increases risk of osteoporotic fracture as well as atrial fibrillation an elderly female.  Osteoporosis-has been on bisphosphonate therapy for 3 years tolerating well and taking appropriately.  We continue calcium and vitamin D and exercise as tolerated.  Will obtain bone mineral density testing evaluate for possibility of bisphosphonate holiday.      Addendum- 2.1.2017- thyroid ultrasound without evidence of recurrence.  Bone mineral density shows T score -2.5 at the lumbar spine -2.1 at the left hip.  Advised to continue on alendronate for another 2 years with repeat bone mineral density at that time.  New line follow-up in one year with thyroid function tests thyroglobulin.  Repeat ultrasound in 2-3 years.

## 2015-11-10 ENCOUNTER — Encounter (HOSPITAL_BASED_OUTPATIENT_CLINIC_OR_DEPARTMENT_OTHER): Payer: Self-pay | Admitting: "Endocrinology

## 2015-11-10 ENCOUNTER — Ambulatory Visit: Payer: Medicare Other | Attending: "Endocrinology

## 2015-11-10 ENCOUNTER — Ambulatory Visit (HOSPITAL_BASED_OUTPATIENT_CLINIC_OR_DEPARTMENT_OTHER): Payer: Medicare Other

## 2015-11-10 DIAGNOSIS — C73 Malignant neoplasm of thyroid gland: Secondary | ICD-10-CM

## 2015-11-10 DIAGNOSIS — M8589 Other specified disorders of bone density and structure, multiple sites: Secondary | ICD-10-CM | POA: Insufficient documentation

## 2015-11-10 DIAGNOSIS — M810 Age-related osteoporosis without current pathological fracture: Secondary | ICD-10-CM | POA: Insufficient documentation

## 2016-02-21 DIAGNOSIS — G4701 Insomnia due to medical condition: Secondary | ICD-10-CM | POA: Insufficient documentation

## 2016-02-21 DIAGNOSIS — B0229 Other postherpetic nervous system involvement: Secondary | ICD-10-CM | POA: Insufficient documentation

## 2016-02-21 DIAGNOSIS — M545 Low back pain, unspecified: Secondary | ICD-10-CM | POA: Insufficient documentation

## 2016-02-21 DIAGNOSIS — G8929 Other chronic pain: Secondary | ICD-10-CM | POA: Insufficient documentation

## 2016-02-21 DIAGNOSIS — R1031 Right lower quadrant pain: Secondary | ICD-10-CM | POA: Insufficient documentation

## 2016-04-27 ENCOUNTER — Encounter (HOSPITAL_BASED_OUTPATIENT_CLINIC_OR_DEPARTMENT_OTHER): Payer: Self-pay | Admitting: "Endocrinology

## 2016-04-27 ENCOUNTER — Other Ambulatory Visit (HOSPITAL_BASED_OUTPATIENT_CLINIC_OR_DEPARTMENT_OTHER): Payer: Self-pay | Admitting: "Endocrinology

## 2016-04-27 ENCOUNTER — Ambulatory Visit: Payer: Medicare Other | Attending: "Endocrinology | Admitting: "Endocrinology

## 2016-04-27 VITALS — BP 149/85 | HR 72 | Wt 151.0 lb

## 2016-04-27 DIAGNOSIS — M810 Age-related osteoporosis without current pathological fracture: Secondary | ICD-10-CM | POA: Insufficient documentation

## 2016-04-27 DIAGNOSIS — C73 Malignant neoplasm of thyroid gland: Secondary | ICD-10-CM

## 2016-04-27 DIAGNOSIS — E890 Postprocedural hypothyroidism: Secondary | ICD-10-CM

## 2016-04-27 DIAGNOSIS — N3000 Acute cystitis without hematuria: Secondary | ICD-10-CM | POA: Insufficient documentation

## 2016-04-27 LAB — THYROID STIMULATING HORMONE: Thyroid Stimulating Hormone: 18.776 u[IU]/mL — ABNORMAL HIGH (ref 0.400–5.000)

## 2016-04-27 LAB — T4, FREE: Thyroxine (Free): 1 ng/dL (ref 0.6–1.2)

## 2016-04-27 MED ORDER — LEVOTHYROXINE SODIUM 125 MCG OR TABS
125.0000 ug | ORAL_TABLET | Freq: Every day | ORAL | 3 refills | Status: DC
Start: 2016-04-27 — End: 2016-07-07

## 2016-04-27 MED ORDER — CIPROFLOXACIN HCL 500 MG OR TABS
500.0000 mg | ORAL_TABLET | Freq: Two times a day (BID) | ORAL | 1 refills | Status: AC
Start: 2016-04-27 — End: 2016-04-30

## 2016-04-27 NOTE — Patient Instructions (Addendum)
Antibiotics for UTI-    Cipro 500 mg tablet in AM and PM for three days, if symptoms continue, take for another three days.    Start thyroid hormone 125 mcg (brown pill) once daily. Separate Calcium from your thyroid by four hours.  Please get thyroid levels checked near end of September.

## 2016-04-27 NOTE — Progress Notes (Addendum)
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  She also has a history of osteoporosis.    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  She has been feeling fatigued constipated has had cold intolerance.  She had a thyroid testing done through her primary care's office and I can see these results via care everywhere.  Her TSH is 27 with a free T4 of 1.1.  She is not forgetting her thyroid hormone she takes it first thing in the morning on an empty stomach.  She does not admix with calcium iron or multivitamins.  It seems she's had an inadvertent decreased dose of levothyroxin prescribed to her, last recorded dose in our system is 125 g she is currently taking 112 g.  She is also had frequency burning and urine culture shows greater than 100,000 Escherichia coli susceptible to Bactrim and Cipro.  She has a sulfa allergy  ROS:   Review of Systems   Constitutional: Positive for malaise/fatigue.        Cold intolerance   HENT: Negative.    Eyes: Negative.    Respiratory: Negative.    Cardiovascular: Negative.    Gastrointestinal: Positive for constipation.   Genitourinary: Positive for frequency and urgency.   Musculoskeletal: Positive for joint pain.   Skin: Negative.    Neurological: Negative.    Psychiatric/Behavioral: Negative.        Patient Active Problem List   Diagnosis   . Papillary thyroid carcinoma ([place])   . Osteoporosis   . Essential hypertension   . Dyslipidemia   . Postoperative hypothyroidism     Outpatient Medications Prior to Visit   Medication Sig Dispense Refill   . Alendronate Sodium 70 MG Oral Tab Take 1  tablet (70 mg) by mouth every 7 days. 12 tablet 3   . AmLODIPine Besylate 5 MG Oral Tab Take 1 tablet (5 mg) by mouth daily. 90 tablet 3   . Levothyroxine Sodium 125 MCG Oral Tab Take 1 tablet (125 mcg) by mouth daily. 90 tablet 3   . Rosuvastatin Calcium 5 MG Oral Tab Take 1 tablet (5 mg) by mouth daily. 90 tablet 3     No facility-administered medications prior to visit.           EXAM:  BP 149/85   Pulse 72   Wt 151 lb (68.5 kg)     Physical Exam  CV  Auscultation  regular rhythm, no murmurs, no rubs, no gallops, General  in no apparent distress, well developed and well nourished and kyphotic, HENT  NCAT, Oral mucosa moist, no lesions, Lungs  nl effort, clear to percussion and auscultation, Neck  Supple, no adenopathy, no abnormal masses, no asymmetry, no jugular venous distention, ROM normal, carotid upstrokes normal, Neurologic  diminished lower extremity strength unable to rise from a chair without using her upper extremities gait is antalgic, Musculoskeletal  positive findings: 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  Increase thyroid hormone to 125 g daily, repeat thyroid function testing in 2 months  Separate levothyroxin from calcium iron or multivitamin by at least 4 hours  Repeat thyroid ultrasound in January 2019    Osteoporosis-has been on bisphosphonate therapy for 3 years tolerating well, will continue for another two years and repeat BMD at that time.    UTI- E. Coli- Cipro 500 mg po BID x 3 days, to repeat for three days if symptoms persist.    Addendum 7.24.2017  Stimulated TG 0.4, spoke with her and she is feeling better. Given Keflex instead of Cipro, improved urinary symptoms and feeling  less fatigued.

## 2016-05-02 LAB — THYROGLOBULIN AND ANTIBODY PANEL (THYROID CANCER RECURRENCE)
Thyroglobulin Antibody: <1 [IU]/mL (ref 0.0–1.9)
Thyroglobulin: 0.4 ng/mL (ref 0.0–35.0)

## 2016-06-06 ENCOUNTER — Other Ambulatory Visit (HOSPITAL_BASED_OUTPATIENT_CLINIC_OR_DEPARTMENT_OTHER): Payer: Self-pay | Admitting: "Endocrinology

## 2016-06-06 DIAGNOSIS — M810 Age-related osteoporosis without current pathological fracture: Secondary | ICD-10-CM

## 2016-06-07 MED ORDER — ALENDRONATE SODIUM 70 MG OR TABS
ORAL_TABLET | ORAL | 3 refills | Status: DC
Start: 2016-06-07 — End: 2017-06-19

## 2016-06-07 NOTE — Telephone Encounter (Signed)
10/21/2015 visit "Advised to continue on alendronate for another 2 years with repeat bone mineral density at that time"

## 2016-06-30 ENCOUNTER — Telehealth (HOSPITAL_BASED_OUTPATIENT_CLINIC_OR_DEPARTMENT_OTHER): Payer: Self-pay

## 2016-06-30 NOTE — Telephone Encounter (Signed)
Patient called with questions about blood work she had at [facility] for her TFT's.  Patient wants to talk to Dr. Hinton Lovely about results. I scheduled her an appt for next week for f/u labs

## 2016-07-06 ENCOUNTER — Encounter (HOSPITAL_BASED_OUTPATIENT_CLINIC_OR_DEPARTMENT_OTHER): Payer: Self-pay | Admitting: "Endocrinology

## 2016-07-06 ENCOUNTER — Ambulatory Visit: Payer: Medicare Other | Attending: "Endocrinology | Admitting: "Endocrinology

## 2016-07-06 ENCOUNTER — Other Ambulatory Visit (HOSPITAL_BASED_OUTPATIENT_CLINIC_OR_DEPARTMENT_OTHER): Payer: Self-pay | Admitting: "Endocrinology

## 2016-07-06 VITALS — BP 126/68 | HR 77 | Wt 152.0 lb

## 2016-07-06 DIAGNOSIS — E890 Postprocedural hypothyroidism: Secondary | ICD-10-CM

## 2016-07-06 DIAGNOSIS — C73 Malignant neoplasm of thyroid gland: Secondary | ICD-10-CM

## 2016-07-06 LAB — THYROID STIMULATING HORMONE: Thyroid Stimulating Hormone: 0.094 u[IU]/mL — ABNORMAL LOW (ref 0.400–5.000)

## 2016-07-06 LAB — T4, FREE: Thyroxine (Free): 1.9 ng/dL — ABNORMAL HIGH (ref 0.6–1.2)

## 2016-07-06 NOTE — Patient Instructions (Signed)
Here are a few tips to help you navigate your healthcare needs:      IF YOU ARE HAVING AN EMERGENCY CALL [phone]     Refills:  Call your pharmacy at least 4 working days before you run out. Do not call the clinic or use email or eCare for refill requests, it's quicker and safer to go through the pharmacy.    . Test Results: Most results are available within 7 days; many are available sooner through eCare. I will contact you of test results by eCare or letter unless there is something urgent, in which case I will call you sooner.     Use eCare to securely communicate with me or our care team for non-urgent issues (questions or concerns that can wait 3 working days before requiring an answer). If you have a long or complex question or have a new issue, please make an appointment to discuss.  Call [phone] to sign-up for eCare or ask your MA to help you sign-up today.     . Urgent Symptoms:  Call [phone], at any time and select option 8. Our clinic staff will help you during regular hours; after hours, our on-call nurses will help you.

## 2016-07-06 NOTE — Progress Notes (Addendum)
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  She also has a history of osteoporosis.    Papillary thyroid CA- 2011 original diagnosis, s/p thyroidectomy with 1.25 cm lesion without LN's  Surveillance unremarkable with [place] in 1.2017- no recurrence or suspicious lesions  March 2012 US unremarkable  April 2012- TG undetectable   July 2017- TSH 18, TG 0.4    INTERVAL HISTORY-  At last visit with malaise, suffering from post-herpetic neuralgia. Diagnosed with UTI and found to be hypothyroid with TSH 18. We increased her LT4 from 112 to 125, trial of Keflex.  Since she has been feeling continued lassitude and difficulty with mobility. Continued herpetic neuralgia. Started biotin supplements one month ago also taking MV's. Her TFTs show TSH 0.283 (Down from 18.7) and free t4 2.59 (up from 1.0). She ahs been c/w dosing and takes in early AM on empty stomach.  Poor sleep hygiene awakens 6x/night with nocturia, no dysuria    ROS:   Review of Systems   Constitutional: Positive for malaise/fatigue.   HENT: Negative.    Eyes: Negative.    Respiratory: Negative.    Cardiovascular: Negative.    Genitourinary: Positive for frequency.   Musculoskeletal: Positive for joint pain.   Skin: Negative.         Pain in L flank- no active rash   Neurological: Negative.    Psychiatric/Behavioral: Negative.        Patient Active Problem List   Diagnosis   . Papillary thyroid carcinoma ([place])   . Osteoporosis   . Essential hypertension   . Dyslipidemia   . Postoperative hypothyroidism   . Acute cystitis without hematuria     Outpatient Medications Prior to Visit   Medication Sig Dispense Refill   . Alendronate Sodium 70 MG Oral Tab TAKE 1 TABLET BY MOUTH 1 TIME EVERY WEEK 12 tablet 3   . AmLODIPine Besylate 5 MG Oral Tab Take 1 tablet (5 mg) by mouth daily. 90 tablet 3   . Levothyroxine Sodium 125 MCG Oral Tab Take 1 tablet (125 mcg) by mouth  daily. 90 tablet 3   . Rosuvastatin Calcium 5 MG Oral Tab Take 1 tablet (5 mg) by mouth daily. 90 tablet 3     No facility-administered medications prior to visit.           EXAM:    BP 126/68   Pulse 77   Wt 152 lb (68.9 kg)     Physical Exam  CV  Auscultation  regular rhythm, no murmurs, no rubs, no gallops, General  in no apparent distress, well developed and well nourished and kyphotic, HENT  NCAT, Oral mucosa moist, no lesions, Lungs  nl effort, clear to percussion and auscultation, Neck  Supple, no adenopathy, no abnormal masses, no asymmetry, no jugular venous distention, ROM normal, carotid upstrokes normal, Neurologic  diminished lower extremity strength unable to rise from a chair without using her upper extremities gait is antalgic, Musculoskeletal  positive findings: 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  Likely this is artifactual from biotin interference with the assay.  Advised her to continue on levothyroxin 125 g daily, separate from calcium intake by 4 hours.  Advised to discontinue biotin and all other supplements except for calcium 600 mg by mouth twice a day and vitamin D3 800 international units daily  Repeat TSH and free T4 in 1 month after biotin washout and determine need for dose adjustment. Will discuss by phone    Osteoporosis-has been on bisphosphonate therapy for 3 years tolerating well, will continue for another two years and repeat BMD at that time.    Nocturia- creating poor sleep hygiene, will consult Urology Dellie Burns to evaluate bladder.  Advised to d/w Dr Berneice Heinrich as well.    Addendum 07/07/2016-spoke with patient she has been taking a combination of 112 g and 125 g levothyroxine I've asked her to reduce to 112 g and repeat thyroid tests in 1 month.  She has stopped biotin to reduce interference with the lab assay.

## 2016-07-07 ENCOUNTER — Other Ambulatory Visit (HOSPITAL_BASED_OUTPATIENT_CLINIC_OR_DEPARTMENT_OTHER): Payer: Self-pay | Admitting: "Endocrinology

## 2016-07-07 DIAGNOSIS — E890 Postprocedural hypothyroidism: Secondary | ICD-10-CM

## 2016-07-07 MED ORDER — LEVOTHYROXINE SODIUM 112 MCG OR TABS
112.0000 ug | ORAL_TABLET | Freq: Every day | ORAL | 3 refills | Status: DC
Start: 2016-07-07 — End: 2016-08-10

## 2016-07-07 NOTE — Addendum Note (Signed)
Addended by: Leonie Man on: 07/07/2016 08:48 AM     Modules accepted: Orders

## 2016-08-07 ENCOUNTER — Telehealth (HOSPITAL_BASED_OUTPATIENT_CLINIC_OR_DEPARTMENT_OTHER): Payer: Self-pay

## 2016-08-07 ENCOUNTER — Telehealth (HOSPITAL_BASED_OUTPATIENT_CLINIC_OR_DEPARTMENT_OTHER): Payer: Self-pay | Admitting: "Endocrinology

## 2016-08-07 NOTE — Telephone Encounter (Signed)
-----   Message from Leonie Man, MD sent at 08/07/2016  8:03 AM PDT -----  Cloretta Ned-    She needs a free t4 and TSH (in system) but would be nice to remind her, she might forget.  Thanks  Paulita Cradle  ----- Message -----  From: Leonie Man, MD  Sent: 08/06/2016  To: Leonie Man, MD    Needs TSH and free t4

## 2016-08-07 NOTE — Telephone Encounter (Signed)
(  TEXTING IS AN OPTION FOR [facility] ONLY)  Is this a [place] clinic? No      RETURN CALL: General message OK      SUBJECT:  General Message     REASON FOR REQUEST: Pt returning call for "Lauren"    MESSAGE: message left was not clear

## 2016-08-07 NOTE — Telephone Encounter (Signed)
Left message for patient letting her know she needs labs done.

## 2016-08-08 NOTE — Telephone Encounter (Signed)
Routing message to [facility]

## 2016-08-08 NOTE — Telephone Encounter (Signed)
Patient returned call and confirmed she will have labs done today or tomorrow.

## 2016-08-08 NOTE — Telephone Encounter (Signed)
See dup encounter

## 2016-08-08 NOTE — Telephone Encounter (Signed)
Patient calling back. Please call her at [phone]. Detailed voicemail is fine.

## 2016-08-09 ENCOUNTER — Other Ambulatory Visit (HOSPITAL_BASED_OUTPATIENT_CLINIC_OR_DEPARTMENT_OTHER): Payer: Self-pay | Admitting: "Endocrinology

## 2016-08-09 ENCOUNTER — Ambulatory Visit
Admit: 2016-08-09 | Discharge: 2016-08-09 | Disposition: A | Discharge status: Home / Self Care | Payer: Medicare Other | Attending: "Endocrinology | Admitting: "Endocrinology

## 2016-08-09 DIAGNOSIS — E890 Postprocedural hypothyroidism: Secondary | ICD-10-CM

## 2016-08-09 DIAGNOSIS — C73 Malignant neoplasm of thyroid gland: Secondary | ICD-10-CM | POA: Insufficient documentation

## 2016-08-09 LAB — T4, FREE: Thyroxine (Free): 1.2 ng/dL (ref 0.6–1.2)

## 2016-08-09 LAB — THYROID STIMULATING HORMONE: Thyroid Stimulating Hormone: 0.16 u[IU]/mL — ABNORMAL LOW (ref 0.400–5.000)

## 2016-08-10 ENCOUNTER — Other Ambulatory Visit (HOSPITAL_BASED_OUTPATIENT_CLINIC_OR_DEPARTMENT_OTHER): Payer: Self-pay | Admitting: "Endocrinology

## 2016-08-10 DIAGNOSIS — E890 Postprocedural hypothyroidism: Secondary | ICD-10-CM

## 2016-08-10 MED ORDER — LEVOTHYROXINE SODIUM 100 MCG OR TABS
100.0000 ug | ORAL_TABLET | Freq: Every day | ORAL | 3 refills | Status: DC
Start: 2016-08-10 — End: 2016-11-03

## 2016-10-30 ENCOUNTER — Encounter (HOSPITAL_BASED_OUTPATIENT_CLINIC_OR_DEPARTMENT_OTHER): Payer: Medicare Other | Admitting: "Endocrinology

## 2016-11-02 ENCOUNTER — Encounter (HOSPITAL_BASED_OUTPATIENT_CLINIC_OR_DEPARTMENT_OTHER): Payer: Self-pay | Admitting: "Endocrinology

## 2016-11-02 ENCOUNTER — Ambulatory Visit: Payer: Medicare Other | Attending: "Endocrinology | Admitting: "Endocrinology

## 2016-11-02 VITALS — BP 142/87 | HR 73 | Wt 150.0 lb

## 2016-11-02 DIAGNOSIS — M810 Age-related osteoporosis without current pathological fracture: Secondary | ICD-10-CM

## 2016-11-02 DIAGNOSIS — C73 Malignant neoplasm of thyroid gland: Secondary | ICD-10-CM

## 2016-11-02 DIAGNOSIS — E890 Postprocedural hypothyroidism: Secondary | ICD-10-CM | POA: Insufficient documentation

## 2016-11-02 LAB — THYROID STIMULATING HORMONE: Thyroid Stimulating Hormone: 0.615 u[IU]/mL (ref 0.400–5.000)

## 2016-11-02 LAB — CBC, DIFF
% Basophils: 1 %
% Eosinophils: 2 %
% Immature Granulocytes: 0 %
% Lymphocytes: 25 %
% Monocytes: 5 %
% Neutrophils: 67 %
% Nucleated RBC: 0 %
Absolute Eosinophil Count: 0.12 10*3/uL (ref 0.00–0.50)
Absolute Lymphocyte Count: 1.75 10*3/uL (ref 1.00–4.80)
Basophils: 0.04 10*3/uL (ref 0.00–0.20)
Hematocrit: 44 % (ref 36–45)
Hemoglobin: 14.5 g/dL (ref 11.5–15.5)
Immature Granulocytes: 0.02 10*3/uL (ref 0.00–0.05)
MCH: 28.4 pg (ref 27.3–33.6)
MCHC: 33.2 g/dL (ref 32.2–36.5)
MCV: 86 fL (ref 81–98)
Monocytes: 0.38 10*3/uL (ref 0.00–0.80)
Neutrophils: 4.74 10*3/uL (ref 1.80–7.00)
Nucleated RBC: 0 10*3/uL
Platelet Count: 257 10*3/uL (ref 150–400)
RBC: 5.1 10*6/uL — ABNORMAL HIGH (ref 3.80–5.00)
RDW-CV: 13.8 % (ref 11.6–14.4)
WBC: 7.05 10*3/uL (ref 4.3–10.0)

## 2016-11-02 LAB — T4, FREE: Thyroxine (Free): 1.5 ng/dL — ABNORMAL HIGH (ref 0.6–1.2)

## 2016-11-02 LAB — VITAMIN B12 (COBALAMIN): Vitamin B12 (Cobalamin): 576 pg/mL (ref 180–914)

## 2016-11-02 NOTE — Progress Notes (Addendum)
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  She also has a history of osteoporosis.    Papillary thyroid CA- 2011 original diagnosis, s/p thyroidectomy with 1.25 cm lesion without LN's  Surveillance unremarkable with [place] in 1.2017- no recurrence or suspicious lesions  March 2012 US unremarkable  April 2012- TG undetectable   July 2017- TSH 18, TG 0.13 October 2015- US- unremarkable    INTERVAL HISTORY-  She is concerned that she is weaker and she feels unstable on her feet.  She has issues with her right knee replacement feeling like it's causing imbalance. She has also had tremor, at rest and with intention, doesn't know if aided by ETOH (rarely drinks). Weight stable, mild constipation, is no longer taking biotin. Her post-herpetic neuralgia continues to bother her with L flank pain.    ROS:   Review of Systems   Constitutional: Positive for malaise/fatigue.   HENT: Negative.    Eyes: Negative.    Respiratory: Negative.    Cardiovascular: Negative.    Genitourinary: Positive for frequency.   Musculoskeletal: Positive for joint pain.   Skin: Negative.         Pain in L flank- no active rash   Neurological: Positive for tremors.   Psychiatric/Behavioral: Negative.        Patient Active Problem List   Diagnosis   . Papillary thyroid carcinoma ([place])   . Osteoporosis   . Essential hypertension   . Dyslipidemia   . Postoperative hypothyroidism   . Acute cystitis without hematuria     Outpatient Medications Prior to Visit   Medication Sig Dispense Refill   . Alendronate Sodium 70 MG Oral Tab TAKE 1 TABLET BY MOUTH 1 TIME EVERY WEEK 12 tablet 3   . AmLODIPine Besylate 5 MG Oral Tab Take 1 tablet (5 mg) by mouth daily. 90 tablet 3   . Levothyroxine Sodium 100 MCG Oral Tab Take 1 tablet (100 mcg) by mouth daily. 90 tablet 3   . Rosuvastatin Calcium 5 MG Oral Tab Take 1 tablet (5 mg) by mouth daily. 90 tablet 3     No  facility-administered medications prior to visit.           EXAM:    BP 142/87   Pulse 73   Wt 150 lb (68 kg)     Physical Exam  CV  Auscultation  regular rhythm, no murmurs, no rubs, no gallops, General  in no apparent distress, well developed and well nourished and kyphotic, HENT  NCAT, Oral mucosa moist, no lesions, Lungs  nl effort, clear to percussion and auscultation, Neck  Supple, no adenopathy, no abnormal masses, no asymmetry, no jugular venous distention, ROM normal, carotid upstrokes normal, Neurologic  diminished lower extremity strength unable to rise from a chair without using her upper extremities gait is antalgic, fine tremor to outstretched hand Musculoskeletal  positive findings: 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    Osteoporosis-has been on bisphosphonate therapy for 3  years tolerating well, will continue for another two years and repeat BMD at that time.    Tremor- check TFTs, B12    Gait instability- PT referral for gait and balance training and lower extremity strengthening      Addendum 1.26.2018  Spoke with pt., she is taking 112 mcg LT4 not the prescribed 100 mcg daily. Advised to discard old prescriptions and start taking 100 mcg daose (Rx sent) repeat TFTs in 2 months.

## 2016-11-02 NOTE — Patient Instructions (Signed)
Here are a few tips to help you navigate your healthcare needs:      IF YOU ARE HAVING AN EMERGENCY CALL [phone]     Refills:  Call your pharmacy at least 4 working days before you run out. Do not call the clinic or use email or eCare for refill requests, it's quicker and safer to go through the pharmacy.    . Test Results: Most results are available within 7 days; many are available sooner through eCare. I will contact you of test results by eCare or letter unless there is something urgent, in which case I will call you sooner.     Use eCare to securely communicate with me or our care team for non-urgent issues (questions or concerns that can wait 3 working days before requiring an answer). If you have a long or complex question or have a new issue, please make an appointment to discuss.  Call [phone] to sign-up for eCare or ask your MA to help you sign-up today.     . Urgent Symptoms:  Call [phone], at any time and select option 8. Our clinic staff will help you during regular hours; after hours, our on-call nurses will help you.

## 2016-11-03 ENCOUNTER — Other Ambulatory Visit (HOSPITAL_BASED_OUTPATIENT_CLINIC_OR_DEPARTMENT_OTHER): Payer: Self-pay | Admitting: "Endocrinology

## 2016-11-03 DIAGNOSIS — E890 Postprocedural hypothyroidism: Secondary | ICD-10-CM

## 2016-11-03 LAB — THYROGLOBULIN AND ANTIBODY PANEL (THYROID CANCER RECURRENCE)
Thyroglobulin Antibody: <1 [IU]/mL (ref 0.0–1.9)
Thyroglobulin: <0.1 ng/mL (ref 0.0–35.0)

## 2016-11-03 MED ORDER — LEVOTHYROXINE SODIUM 100 MCG OR TABS
100.0000 ug | ORAL_TABLET | Freq: Every day | ORAL | 3 refills | Status: DC
Start: 2016-11-03 — End: 2017-12-06

## 2017-01-18 ENCOUNTER — Inpatient Hospital Stay: Payer: Self-pay

## 2017-05-07 ENCOUNTER — Inpatient Hospital Stay: Payer: Self-pay

## 2017-06-19 ENCOUNTER — Other Ambulatory Visit (HOSPITAL_BASED_OUTPATIENT_CLINIC_OR_DEPARTMENT_OTHER): Payer: Self-pay | Admitting: "Endocrinology

## 2017-06-19 DIAGNOSIS — M810 Age-related osteoporosis without current pathological fracture: Secondary | ICD-10-CM

## 2017-06-20 MED ORDER — ALENDRONATE SODIUM 70 MG OR TABS
ORAL_TABLET | ORAL | 1 refills | Status: DC
Start: 2017-06-20 — End: 2020-04-09

## 2017-06-20 NOTE — Telephone Encounter (Signed)
Called patient to schedule, no answer, left a message for a returned call.

## 2017-06-20 NOTE — Telephone Encounter (Signed)
Patient last seen on 11/02/2016 and was to return in 6 months.  One refill authorized.  Please schedule follow up visit.

## 2017-12-03 ENCOUNTER — Telehealth (HOSPITAL_BASED_OUTPATIENT_CLINIC_OR_DEPARTMENT_OTHER): Payer: Self-pay

## 2017-12-03 DIAGNOSIS — C73 Malignant neoplasm of thyroid gland: Secondary | ICD-10-CM

## 2017-12-03 DIAGNOSIS — M810 Age-related osteoporosis without current pathological fracture: Secondary | ICD-10-CM

## 2017-12-03 DIAGNOSIS — E890 Postprocedural hypothyroidism: Secondary | ICD-10-CM

## 2017-12-03 NOTE — Telephone Encounter (Signed)
Olivia Mcconnell called based on TFT's.  Pt taking 100 mcg some days and 125 mcg some days.  RN confirmed completely empty stomach, not taking other meds, waiting 30 minutes to eat.

## 2017-12-03 NOTE — Telephone Encounter (Signed)
Pt believes she is taking the 100 mcg tabs daily. Pt only waiting 2 hours to take vitamins.     Pt scheduled for FU 12/17/16. [place] ordered and pt transferred to schedule. Labs entered.

## 2017-12-03 NOTE — Telephone Encounter (Signed)
Appt reminder letter mailed and instructions for taking levothyroxine.

## 2017-12-03 NOTE — Telephone Encounter (Signed)
Spoke with pt and directed to start consistently taking the 125 mcg dose.  Instructed pt to imeediately dispose of the 100 mcg medication and encouraged to bring in tomorrow as she is completing her [place] and drop off in med destroy bin near pharmacy on 1st floor. Pt verbalized understanding and agreeable with plan.

## 2017-12-03 NOTE — Telephone Encounter (Signed)
Message   Received: Today   Message Contents   [place], Lacinda Axon, MD  Elveria Rising, RN   Caller: Unspecified (Today, 8:33 AM)            She should probably get rid of the 100 mcg dose and take 125 mcg daily.   Thanks   Paulita Cradle    Previous Messages            Care Coordination     Desantis, Lacinda Axon, MD  You 1 minute ago (9:12 AM)      She should probably get rid of the 100 mcg dose and take 125 mcg daily.   Thanks   [employer]

## 2017-12-04 ENCOUNTER — Ambulatory Visit: Payer: Medicare Other | Attending: "Endocrinology

## 2017-12-04 DIAGNOSIS — C73 Malignant neoplasm of thyroid gland: Secondary | ICD-10-CM | POA: Insufficient documentation

## 2017-12-06 ENCOUNTER — Encounter (HOSPITAL_BASED_OUTPATIENT_CLINIC_OR_DEPARTMENT_OTHER): Payer: Self-pay | Admitting: "Endocrinology

## 2017-12-06 ENCOUNTER — Ambulatory Visit: Payer: Medicare Other | Attending: "Endocrinology | Admitting: "Endocrinology

## 2017-12-06 VITALS — BP 138/91 | HR 69 | Ht 63.0 in | Wt 159.0 lb

## 2017-12-06 DIAGNOSIS — M810 Age-related osteoporosis without current pathological fracture: Secondary | ICD-10-CM | POA: Insufficient documentation

## 2017-12-06 DIAGNOSIS — Z6828 Body mass index (BMI) 28.0-28.9, adult: Secondary | ICD-10-CM

## 2017-12-06 DIAGNOSIS — I10 Essential (primary) hypertension: Secondary | ICD-10-CM | POA: Insufficient documentation

## 2017-12-06 DIAGNOSIS — E890 Postprocedural hypothyroidism: Secondary | ICD-10-CM | POA: Insufficient documentation

## 2017-12-06 DIAGNOSIS — E785 Hyperlipidemia, unspecified: Secondary | ICD-10-CM

## 2017-12-06 DIAGNOSIS — C73 Malignant neoplasm of thyroid gland: Secondary | ICD-10-CM | POA: Insufficient documentation

## 2017-12-06 MED ORDER — LEVOTHYROXINE SODIUM 125 MCG OR TABS
125.0000 ug | ORAL_TABLET | Freq: Every day | ORAL | 3 refills | Status: DC
Start: 2017-12-06 — End: 2020-04-09

## 2017-12-06 NOTE — Patient Instructions (Signed)
Here are a few tips to help you navigate your healthcare needs:      IF YOU ARE HAVING AN EMERGENCY CALL [phone]     Refills:  Call your pharmacy at least 4 working days before you run out. Do not call the clinic or use email or eCare for refill requests, it's quicker and safer to go through the pharmacy.    . Test Results: Most results are available within 7 days; many are available sooner through eCare. I will contact you of test results by eCare or letter unless there is something urgent, in which case I will call you sooner.     Use eCare to securely communicate with me or our care team for non-urgent issues (questions or concerns that can wait 3 working days before requiring an answer). If you have a long or complex question or have a new issue, please make an appointment to discuss.  Call [phone] to sign-up for eCare or ask your MA to help you sign-up today.     . Urgent Symptoms:  Call [phone], at any time and select option 8. Our clinic staff will help you during regular hours; after hours, our on-call nurses will help you.

## 2017-12-06 NOTE — Progress Notes (Signed)
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  She also has a history of osteoporosis.    Papillary thyroid CA- 2011 original diagnosis, s/p thyroidectomy with 1.25 cm lesion without LN's  Surveillance unremarkable with [place] in 1.2017- no recurrence or suspicious lesions  March 2012 US unremarkable  April 2012- TG undetectable   July 2017- TSH 18, TG 0.13 October 2015- US- unremarkable  2.26.2019- Thyroid US- unremarkable         TSH 11.6, TG 0.3    INTERVAL HISTORY-  She is weak, less gumption, thinks she has been taking incorrect dose of LT4 (prescribed 125 mcg, currently taking combination of 100 mcg and 125 mcg throughout the week. Last TFTs with TSH 11 (TG 0.3)  No change in neck size, no swallowing difficulties, no LAD.    ROS:   REVIEW of SYSTEMS-  General- fatigue  HENT- no nasal discharge, denies sore throat, no dysphagia or dysphonia  Eyes- denies swelling, denies visual changes, denies diplopia, denies loss of vision  NECK- denies pain, denies change in neck size, no swallowing difficulty, denies choking sensation  CV- denies chest pain, denies palpitations, denies lower extremity edema, denies orthostasis  GI-  constipation,   NEURO- Denies paresthesias, denies sensory loss, denies focal weakness, denies seizures  MS- L knee pain,   PSYCH- Denies anxiety, denies depression, denies insomnia      Patient Active Problem List   Diagnosis    Papillary thyroid carcinoma ([facility])    Osteoporosis    Essential hypertension    Dyslipidemia    Postoperative hypothyroidism    Acute cystitis without hematuria     Outpatient Medications Prior to Visit   Medication Sig Dispense Refill    Alendronate Sodium 70 MG Oral Tab TAKE 1 TABLET BY MOUTH 1 TIME EVERY WEEK 12 tablet 1    AmLODIPine Besylate 5 MG Oral Tab Take 1 tablet (5 mg) by mouth daily. 90 tablet 3    Levothyroxine Sodium 100 MCG Oral Tab Take 1 tablet  (100 mcg) by mouth daily. 90 tablet 3    Rosuvastatin Calcium 5 MG Oral Tab Take 1 tablet (5 mg) by mouth daily. 90 tablet 3     No facility-administered medications prior to visit.           EXAM:    BP (!) 138/91    Pulse 69    Ht 5\' 3"  (1.6 m)    Wt 159 lb (72.1 kg)    BMI 28.17 kg/m     Physical Exam  BP (!) 138/91    Pulse 69    Ht 5\' 3"  (1.6 m)    Wt 159 lb (72.1 kg)    BMI 28.17 kg/m     General- NAD, well developed well nourished  HENT- NCAT, Oral mucosa moist, no lesions, normal dentition  Eyes-EOMI, PERRLA,   Neck- supple, FROM, normal carotid upstrokes, no LAD  Thyroid- normal to palpation, normal size without nodules  CV- RRR nlS1 and S2 no M/G/R  Lungs- clear  MS-normal joints, spine in alignment, FROM, no erythema or swelling  Neuro- Motor strength normal, no Tremor  Skin- no cutaneous breakdown, no erythema  Hematologic- no petechiae, no LAD  Psych- normal affect, good eye contact, normal conversational tone       DATA REVIEW:  Labs:  2.22.2019-TSH 11.6 free t4 1.17    ASSESSMENT AND PLAN:  Papillary thyroid carcinoma-surveillance  shows no evidence of recurrence  Repeat US in 2.2022  Increase LT4 to 125 mc daily, repeat TFts in 2 months    Post-operative hypothyroidism-  Encouraged adherence with LT4 and repeat TFTs in 2 months    Osteoporosis-has been on bisphosphonate therapy since 2015 tolerating well, will repeat DXA in 2.2020 and determine possible holiday    Gait instability- PT referral for gait and balance training and lower extremity strengthening

## 2017-12-17 ENCOUNTER — Encounter (HOSPITAL_BASED_OUTPATIENT_CLINIC_OR_DEPARTMENT_OTHER): Payer: Medicare Other | Admitting: "Endocrinology

## 2018-03-11 ENCOUNTER — Telehealth (HOSPITAL_BASED_OUTPATIENT_CLINIC_OR_DEPARTMENT_OTHER): Payer: Self-pay | Admitting: "Endocrinology

## 2018-03-11 NOTE — Telephone Encounter (Signed)
(  TEXTING IS AN OPTION FOR [facility] ONLY)  Is this a [place] clinic? No      RETURN CALL: Detailed message on voicemail only      SUBJECT:  General Message     REASON FOR REQUEST: Received letter     MESSAGE: Pt states that she received a letter regarding Dr Hinton Lovely. Letter it states that he is no only treating certain pt's (diabetes,pitutary disease,and calcium disorders). Pt has a total thyroidectomy. Pt is unsure who she should schedule an appointment with and service manual does not mention anything about transfer of care for Dr Hinton Lovely pt's. Please contact and assist, thank you!

## 2018-03-12 ENCOUNTER — Ambulatory Visit (HOSPITAL_BASED_OUTPATIENT_CLINIC_OR_DEPARTMENT_OTHER): Admit: 2018-03-12 | Discharge: 2018-03-12 | Disposition: A | Discharge status: Home / Self Care | Payer: Self-pay

## 2018-03-12 NOTE — Telephone Encounter (Signed)
Dr. Hinton Lovely has placed referral for Patient to be seen at [facility] by Dr. Jabier Mutton for Transfer or Care - Patient has been contacted and scheduled.

## 2018-04-05 ENCOUNTER — Encounter (HOSPITAL_BASED_OUTPATIENT_CLINIC_OR_DEPARTMENT_OTHER): Payer: Self-pay

## 2018-04-05 ENCOUNTER — Ambulatory Visit (HOSPITAL_BASED_OUTPATIENT_CLINIC_OR_DEPARTMENT_OTHER): Payer: Self-pay | Admitting: Endocrinology

## 2018-04-10 ENCOUNTER — Encounter (HOSPITAL_BASED_OUTPATIENT_CLINIC_OR_DEPARTMENT_OTHER): Payer: Self-pay

## 2018-12-06 ENCOUNTER — Telehealth (HOSPITAL_BASED_OUTPATIENT_CLINIC_OR_DEPARTMENT_OTHER): Payer: Self-pay | Admitting: "Endocrinology

## 2018-12-06 NOTE — Telephone Encounter (Signed)
Patient has appt with provider on 12/23/2018 for f/u with labs.     Called and left message for patient asking them to get labs done prior to upcoming appt.

## 2018-12-19 ENCOUNTER — Telehealth (HOSPITAL_BASED_OUTPATIENT_CLINIC_OR_DEPARTMENT_OTHER): Payer: Self-pay | Admitting: "Endocrinology

## 2018-12-19 NOTE — Telephone Encounter (Signed)
Due to COVID concerns, MD is offering phone visits to higher risk patients.     Called patient and left voicemail offering phone visit. If patient calls back, please ask if they would like a telephone visit. If yes, please send TE to front desk with their preferred phone number so we can adjust the appointment.

## 2018-12-23 ENCOUNTER — Encounter (HOSPITAL_BASED_OUTPATIENT_CLINIC_OR_DEPARTMENT_OTHER): Payer: Medicare Other | Admitting: "Endocrinology

## 2018-12-23 ENCOUNTER — Ambulatory Visit: Payer: Medicare Other | Attending: "Endocrinology | Admitting: "Endocrinology

## 2018-12-23 ENCOUNTER — Ambulatory Visit (HOSPITAL_BASED_OUTPATIENT_CLINIC_OR_DEPARTMENT_OTHER): Payer: Medicare Other | Admitting: "Endocrinology

## 2018-12-23 VITALS — BP 132/71 | HR 86 | Wt 164.0 lb

## 2018-12-23 DIAGNOSIS — Z6829 Body mass index (BMI) 29.0-29.9, adult: Secondary | ICD-10-CM

## 2018-12-23 DIAGNOSIS — C73 Malignant neoplasm of thyroid gland: Secondary | ICD-10-CM | POA: Insufficient documentation

## 2018-12-23 NOTE — Progress Notes (Signed)
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  She also has a history of osteoporosis.    Papillary thyroid CA- 2011 original diagnosis, s/p thyroidectomy with 1.25 cm lesion without LN's  Surveillance unremarkable with [place] in 1.2017- no recurrence or suspicious lesions  March 2012 US unremarkable  April 2012- TG undetectable   July 2017- TSH 18, TG 0.13 October 2015- US- unremarkable  2.26.2019- Thyroid US- unremarkable         TSH 11.6, TG 0.3    INTERVAL HISTORY-  She is tired, currently living with her son Olivia Mcconnell and his family. Has presumptive diagnosis of NPH, experiencing unsteadiness, cognitive issues and incontinence. Noticed decreased adherence to LT4, was hypothyroid in January and now has been more adherent with medications. Feeling somewhat better, considering Diamox trial for NPH.     ROS:   REVIEW of SYSTEMS-  General- fatigue  HENT- no nasal discharge, denies sore throat, no dysphagia or dysphonia  Eyes- denies swelling, denies visual changes, denies diplopia, denies loss of vision  NECK- denies pain, denies change in neck size, no swallowing difficulty, denies choking sensation  CV- denies chest pain, denies palpitations, denies lower extremity edema, denies orthostasis  GU- incontinence  NEURO- Imbalance, gait unsteady  MS- L knee pain, lower extremity edema  PSYCH- Denies anxiety, denies depression, denies insomnia      Patient Active Problem List   Diagnosis   . Papillary thyroid carcinoma ([place])   . Osteoporosis   . Essential hypertension   . Dyslipidemia   . Postoperative hypothyroidism   . Acute cystitis without hematuria     Outpatient Medications Prior to Visit   Medication Sig Dispense Refill   . Alendronate Sodium 70 MG Oral Tab TAKE 1 TABLET BY MOUTH 1 TIME EVERY WEEK 12 tablet 1   . AmLODIPine Besylate 5 MG Oral Tab Take 1 tablet (5 mg) by mouth daily. 90 tablet 3   . Levothyroxine Sodium  125 MCG Oral Tab Take 1 tablet (125 mcg) by mouth daily. 90 tablet 3   . Rosuvastatin Calcium 5 MG Oral Tab Take 1 tablet (5 mg) by mouth daily. 90 tablet 3     No facility-administered medications prior to visit.           EXAM:    BP 132/71   Pulse 86   Wt 164 lb (74.4 kg)   BMI 29.05 kg/m     Physical Exam  BP 132/71   Pulse 86   Wt 164 lb (74.4 kg)   BMI 29.05 kg/m     General- NAD, well developed well nourished  HENT- NCAT, Oral mucosa moist, no lesions, normal dentition  Eyes-EOMI, PERRLA,   Neck- supple, FROM, normal carotid upstrokes, no LAD  Thyroid- non-palpable  Lungs- clear  MS-edema to below knee  Neuro-weakness upon arising, unsteady gait with cane  Skin- no cutaneous breakdown, no erythema  Psych- normal affect, good eye contact, normal conversational tone     DATA REVIEW:  Labs: Care Everywhere  2.10.2020-TSH 57.5 free t4 0.59    ASSESSMENT AND PLAN:  Papillary thyroid carcinoma-surveillance shows no evidence of recurrence  Repeat US in 2.2022  Continue LT4  125 mc daily, repeat TFts in 2 months if safe to travel to labs    Post-operative hypothyroidism-  Encouraged adherence with LT4, discussed options for weekly dosing and repeat TFTs in 2 months    Osteoporosis-has been  on bisphosphonate therapy since 2015 tolerating well, will repeat DXA  and determine possible holiday    Gait instability- presumptive NPH, will trial diamox

## 2019-01-16 NOTE — Telephone Encounter (Signed)
A user error has taken place: encounter opened in error, closed for administrative reasons.

## 2019-12-03 ENCOUNTER — Telehealth: Payer: Self-pay

## 2019-12-03 NOTE — Telephone Encounter (Signed)
Called both pt and pt's son [name]). Left msg w/ both pt home phone # and son.

## 2019-12-09 DIAGNOSIS — G912 (Idiopathic) normal pressure hydrocephalus: Secondary | ICD-10-CM | POA: Insufficient documentation

## 2020-01-19 ENCOUNTER — Telehealth (HOSPITAL_BASED_OUTPATIENT_CLINIC_OR_DEPARTMENT_OTHER): Payer: Self-pay | Admitting: "Endocrinology

## 2020-01-19 NOTE — Telephone Encounter (Signed)
RETURN CALL: Voicemail - Detailed Message      SUBJECT:  Appointment Request     REASON FOR VISIT: see 02/24 TE pt's son prefers tele health  PREFERRED DATE/TIME: Anytime week of 05/03  ADDITIONAL INFORMATION: The CCR attempted to warm transfer, but care team was assisting other patients.

## 2020-01-19 NOTE — Telephone Encounter (Signed)
Attempted to call patient back on both home and mobile number. Number wouldn't ring, unable to get through

## 2020-02-19 DIAGNOSIS — N3941 Urge incontinence: Secondary | ICD-10-CM | POA: Insufficient documentation

## 2020-04-01 HISTORY — PX: COLONOSCOPY: SHX5228

## 2020-04-06 ENCOUNTER — Ambulatory Visit: Payer: Medicare Other

## 2020-04-06 DIAGNOSIS — K635 Polyp of colon: Secondary | ICD-10-CM | POA: Insufficient documentation

## 2020-04-06 DIAGNOSIS — D509 Iron deficiency anemia, unspecified: Secondary | ICD-10-CM | POA: Insufficient documentation

## 2020-04-06 DIAGNOSIS — C189 Malignant neoplasm of colon, unspecified: Secondary | ICD-10-CM | POA: Insufficient documentation

## 2020-04-06 DIAGNOSIS — K449 Diaphragmatic hernia without obstruction or gangrene: Secondary | ICD-10-CM | POA: Insufficient documentation

## 2020-04-07 DIAGNOSIS — C182 Malignant neoplasm of ascending colon: Secondary | ICD-10-CM | POA: Insufficient documentation

## 2020-04-08 ENCOUNTER — Other Ambulatory Visit (HOSPITAL_BASED_OUTPATIENT_CLINIC_OR_DEPARTMENT_OTHER): Payer: Self-pay

## 2020-04-08 ENCOUNTER — Encounter (HOSPITAL_BASED_OUTPATIENT_CLINIC_OR_DEPARTMENT_OTHER): Payer: Self-pay | Admitting: Surgery

## 2020-04-08 DIAGNOSIS — C182 Malignant neoplasm of ascending colon: Secondary | ICD-10-CM

## 2020-04-08 NOTE — H&P (Signed)
PROCEDURE NAME: robotic right hemicolectomy  POSITION: lithotomy  DESIRED DATE: 04/16/2020  DURATION (LENGTH OF CASE): 6 hours  [place] LOS: 4 days  IMPLANTS/EQUIPMENT: Exparel  PREOP REQUIREMENTS (Med Con, Colonoscopy, etc): PACS    Marcelo Baldy, MD

## 2020-04-09 ENCOUNTER — Ambulatory Visit (HOSPITAL_COMMUNITY): Payer: Self-pay

## 2020-04-09 ENCOUNTER — Other Ambulatory Visit (HOSPITAL_COMMUNITY): Admit: 2020-04-09 | Discharge: 2020-04-09 | Disposition: A | Discharge status: Home / Self Care | Payer: Medicare Other

## 2020-04-09 ENCOUNTER — Ambulatory Visit (HOSPITAL_BASED_OUTPATIENT_CLINIC_OR_DEPARTMENT_OTHER): Payer: Medicare Other | Admitting: Nurse Practitioner

## 2020-04-09 ENCOUNTER — Encounter (HOSPITAL_BASED_OUTPATIENT_CLINIC_OR_DEPARTMENT_OTHER): Payer: Self-pay | Admitting: Nurse Practitioner

## 2020-04-09 ENCOUNTER — Encounter (HOSPITAL_BASED_OUTPATIENT_CLINIC_OR_DEPARTMENT_OTHER): Payer: Self-pay | Admitting: Surgery

## 2020-04-09 ENCOUNTER — Encounter (HOSPITAL_COMMUNITY): Payer: Self-pay | Admitting: Student in an Organized Health Care Education/Training Program

## 2020-04-09 ENCOUNTER — Other Ambulatory Visit (HOSPITAL_BASED_OUTPATIENT_CLINIC_OR_DEPARTMENT_OTHER): Payer: Self-pay | Admitting: Surgery

## 2020-04-09 ENCOUNTER — Other Ambulatory Visit (HOSPITAL_BASED_OUTPATIENT_CLINIC_OR_DEPARTMENT_OTHER): Payer: Self-pay

## 2020-04-09 ENCOUNTER — Other Ambulatory Visit (HOSPITAL_COMMUNITY): Payer: Self-pay | Admitting: Student in an Organized Health Care Education/Training Program

## 2020-04-09 ENCOUNTER — Ambulatory Visit (HOSPITAL_BASED_OUTPATIENT_CLINIC_OR_DEPARTMENT_OTHER): Payer: Medicare Other | Admitting: Surgery

## 2020-04-09 ENCOUNTER — Encounter (HOSPITAL_COMMUNITY): Payer: Self-pay

## 2020-04-09 ENCOUNTER — Ambulatory Visit: Payer: Medicare Other | Attending: Nurse Practitioner | Admitting: Surgery

## 2020-04-09 ENCOUNTER — Encounter (HOSPITAL_BASED_OUTPATIENT_CLINIC_OR_DEPARTMENT_OTHER): Payer: Medicare Other | Admitting: Nurse Practitioner

## 2020-04-09 VITALS — BP 111/56 | HR 74 | Temp 97.5°F | Ht 63.0 in | Wt 137.0 lb

## 2020-04-09 DIAGNOSIS — C188 Malignant neoplasm of overlapping sites of colon: Secondary | ICD-10-CM | POA: Insufficient documentation

## 2020-04-09 MED ORDER — NEOMYCIN SULFATE 500 MG OR TABS
ORAL_TABLET | ORAL | 0 refills | Status: DC
Start: 2020-04-09 — End: 2020-04-20

## 2020-04-09 MED ORDER — PEG-KCL-NACL-NASULF-NA ASC-C 100 G OR SOLR
ORAL | 0 refills | Status: DC
Start: 2020-04-09 — End: 2020-04-20

## 2020-04-09 MED ORDER — METRONIDAZOLE 500 MG OR TABS
ORAL_TABLET | ORAL | 0 refills | Status: DC
Start: 2020-04-09 — End: 2020-04-20

## 2020-04-09 NOTE — Progress Notes (Signed)
I saw and evaluated the patient. I have reviewed the resident's documentation and agree with it.    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  She has been experiencing fatigue and occasional shortness of breath but underwent the scope for surveillance.  Staging CT scan of the chest, abdomen, pelvis demonstrates a bulky lesion with enlargement of locoregional lymph nodes but no evidence definitive distant metastasis.  She currently has no symptoms of obstruction but did have one episode a couple of days ago with projectile vomiting.  She has not had nausea or emesis since since that time.  She has minimal abdominal pain but is passing gas and having regular bowel movements.  On exam her abdomen is soft, nondistended, mildly tender in the right lower and upper quadrants.  There is no palpable inguinal lymphadenopathy.  Her case was discussed in our multidisciplinary team conference and the clinical history and imaging reviewed and it was felt that she was a candidate for primary surgical resection.  Depending on the pathology results she will see medical oncology for possible chemotherapy.  I discussed the procedure of a robotic right hemicolectomy with the patient and her son and reviewed the benefits and risks of the procedure.  She has also seen a Psychologist, sport and exercise at [facility] and currently is scheduled there for surgery in a few weeks.  I do not have labs since April and there is no CEA and therefore we ordered CMP, CBC, and CEA.  She will follow-up with [place] early next week if she decides to undergo surgery at the [facility].    I spent a total of 60 minutes for the patient's care on the date of the service including chart review, history taking, physical exam, patient and family education, orders, communication with other providers, documentation, and care coordination.

## 2020-04-09 NOTE — Progress Notes (Signed)
COLON CANCER OUTPATIENT CONSULTATION    CHIEF COMPLAINT: Malignant neoplasm of ascending colon      HISTORY OF PRESENT ILLNESS:  Olivia Mcconnell is a 79 year old female with past medical hx of breast cancer 1990 s/p right mastectomy chemo and thyroid cancer s/p total tyroidectomy 2011, GERD, HLD, HTN,  who presents for evaluation of cecal cancer.     She is here today was her son [name]. She was supposed to have a colonoscopy done which was delayed in the past year due to [place]. Noted to have symptomatic anemia found by her PCP, currently experiencing decreased tolerance of activity. She used to be able to walk a few blocks and go up and now stairs however could now only walk less than a block. Screening colonoscopy done end of June showed an obstructing mass in the cecum and CT showed a primary cecal neoplasm invading ileocecal valve. She is scheduled for a robotic right hemicolectomy on 04/16/20.    Additionally, during [place] she moved out of her assisted living facility, [facility], and moved in with her son. Her son eats a very healthy diet and she has lost 30lbs due to diet from her son's cooking. Currently she has 1-2 soft bowl movements a day with infrequent constipation. 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 Of note, her husband passed away from colon cancer in 1984.  Most recently she has been experiencing difficulty swallowing, describes it as the sensation of food occasionally being stuck. Last week she had an episode of projectile vomiting that woke her up from sleep. She has not had additional episodes of vomiting since.     Endsocopy:  CT Chest/abd/pelvis w/ contrast 04/06/20:  Impression  1. 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   2. Local neoplastic lymphadenopathy in the cecal mesentery and the right ileocolonic mesentery,  the largest local neoplastic lymph node is 21 x 14 x 17 mm.   3. No additional abdominal or pelvic metastatic disease, including no intrahepatic metastasis.   4. A few very tiny bilateral pulmonary nodules, which are more likely to represent residua of prior inflammatory disease rather than pulmonary metastases.   5. Pelvic floor laxity, with subsequent moderate 3 cm inferior descent of intrapelvic structures.   6. Cholelithiasis.   7. Moderate diverticulosis.     Colonoscopy 04/07/20:   Impression:     - Diverticulosis in the sigmoid colon, in the descending colon and in     the transverse colon.     - One 3 mm polyp in the sigmoid colon, removed with a cold biopsy     forceps. Resected and retrieved.     - Likely malignant partially obstructing tumor in the cecum. Tattooed.     Biopsied.     - Two 2 to 4 mm polyps in the ascending colon.     - The examination was otherwise normal on direct and retroflexion views      Past Medical History:   No past medical history on file.   Patient Active Problem List   Diagnosis   . Papillary thyroid carcinoma ([place])   . Osteoporosis   . Essential hypertension   . Dyslipidemia   . Postoperative hypothyroidism   . Acute cystitis without hematuria   . Cancer of ascending colon [facility])         Past Surgical  History:   No past surgical history on file.     Family and Social History:   family history is not on file.   Social History     Tobacco Use   . Smoking status: Never Smoker   Substance Use Topics   . Alcohol use: Not on file   . Drug use: Not on file         Active Meds:   Current Outpatient Medications   Medication Sig Dispense Refill   . Alendronate Sodium 70 MG Oral Tab TAKE 1 TABLET BY MOUTH 1 TIME EVERY WEEK 12 tablet 1   . AmLODIPine Besylate 5 MG Oral Tab Take 1 tablet (5 mg) by mouth daily. 90 tablet 3   . Levothyroxine Sodium 125 MCG Oral Tab Take 1 tablet (125 mcg) by mouth daily. 90 tablet 3   . Rosuvastatin Calcium 5 MG Oral Tab Take 1 tablet (5  mg) by mouth daily. 90 tablet 3     No current facility-administered medications for this visit.          Allergies:   Review of patient's allergies indicates:  Allergies   Allergen Reactions   . Ciprofloxacin Unknown   . Sulfa Antibiotics Unknown        Review of Systems:   ROS:   Constitutional: Positive for weight loss from diet   Eyes: Positive for dry eyes related to cleaning chemicals used in assisted living facilty.   Ears, Nose, Mouth, Throat: Positive for difficulty swallowing, globus sensation.   Cardiovascular: Negative    Respiratory: Negative    Gastrointestinal: As noted in HPI above   Genitourinary: Negative   Musculoskeletal: As noted in HPI above   Skin: Negative    Neurological: As noted in HPI above   Psychiatric: Negative    Endocrine: Negative    Hematologic/Lymphatic: Negative   Allergic/Immunologic: Negative       Physical Exam:   BP 111/56   Pulse 74   Temp 36.4 C   Ht 5\' 3"  (1.6 m)   Wt 62.1 kg (137 lb)   SpO2 97%   BMI 24.27 kg/m   Constitutional: Sitting alert in wheelchair, moderately frail appearing.  HEENT: Normocephalic, atraumatic, sclera anicteric, no ocular discharge, EOMI bilaterally, trachea midline  CV: Rate and rhythm regular to palpation.   Pulm: Unlabored breathing on room air. No accessory muscle usage or distress with breathing.   Abdomen: Non-distended, soft. Mild LLQ abdominal pain to palpation.   Neuro: Alert and oriented- did not assess gait given patient's frailty and instability.  MSK: Bulk and tone appropriate. No edema in extremities.   Skin: No obvious rashes or lesions.   Psych: Mood and affect appropriate and congruent      ASSESSMENT:  Olivia Mcconnell is a 79 year old female for evaluation of cecal mass noted on colonoscopy and CT noted to invade the ileocecal valve. Patient seen by Dr. Lamarr Lulas at [facility] who recommended robotic hemicolectomy but did not have availability until August. Patient and family expressed interest in intervening sooner  which is why they presented for a second opinion. Symptomatic anemia likely secondary to cecal mass. Hgb stable around 9.0. Patient and imaging reviewed at [facility] multidisciplinary conference and agree with plan for surgical resection, possibly followed by chemotherapy.   PLAN:   1. Plan to proceed with Xi robotic right hemicolectomy on 04/16/20. Movi prep to be done preop. Patient to be evaluated by anesthesia preop.   2. Repeat CBC, BMP and  CEA today.     Electa Sniff, MD  General Surgery PGY-1  Pager #: [phone]

## 2020-04-09 NOTE — Preprocedure Instructions (Signed)
Pre-Anethesia Instructions    Day Before Surgery Call:  You will receive a phone call between 9am to 1PM the workday before surgery that will provide check-in time and location. If you do not receive that call by 2PM, please call the Pre Anesthesia Clinic before 6PM at [phone]. After 6PM, please call [phone] for your arrival time.    Fasting:  Do not eat or drink after midnight unless otherwise instructed. You may have clear liquids up until 2 hours before your arrival time (examples include water, clear apple juice, black coffee or tea)    Preparing for Surgery:  Use CHG (antibacterial soap) to shower or bathe both the night before and the morning of surgery from the neck down  thoroughly, especially around the area of your surgery. Do not use it on your face or hair.  Do not use make-up, deodorant, lotions, hair products, or fragrances on the day of surgery.  Do not shave any part of your body 48 hours before surgery.  Warming measures will be used before surgery to keep you warm and to reduce Surgical-Wound Infection.  An IV will be started in the pre-op and Perioperative antibiotic may given as a prophylaxis measure.  The surgeon will mark the procedure sites.    Recovery Room Stay:  Recovery room stay discussed.  If going home on the day of Surgery you must have an adult to drive you home and someone to care for  you for at least over-night.    Medications:  Except as otherwise instructed (below or by other caregivers), continue to take your medications at your usual times.  - Stop all herbal medications seven (7) days before surgery.  - The morning of surgery please TAKE Levothyroxine only  - You may continue multi vitamin and iron supplement up until the day before surgery.  Hold on day of surgery  - You may take Amlodipine the evening before surgery on your normal schedule

## 2020-04-09 NOTE — Progress Notes (Signed)
Diagnosis: Overlapping malignant neoplasm of colon    Challenges or barriers to learning (cognitive, emotional, physical limitations, pain): n/a  Interpreter:NO    Topic(s) Taught:  Yellow Packet completed.  Patient scheduled for surgery with Dr. Windell Norfolk  for colectomy on April 16, 2020.  Preoperative educational material reviewed with patient are as follows: About your surgery experience, Information about your health care, Medicines to avoid before surgery, Deep vein thrombosis, Preventing falls at [facility], Pain management and expectations, constipation after your operation, wound care, post-op appointment, post-op complications to monitor and call instructions.    Extensive review of the surgical care pathway was completed.      Pre-op Instructions:  [x]  NO BOWEL PREP  [x]  NPO after midnight  [x]  8 ounce apple juice before midnight and 2 hours prior to surgery  [x]  E.R.A.S. Pathway and care map instructions and rationale reviewed in detail   [x]  Given pt. education regarding increasing protein in diet      Medication Instructions:  [x]  Medication review done.    []  Medication review deferred to [facility]  [x]  Patient instructed to hold herbal supplements, Omega 3/Fish oil and all NSAIDS (Ibuprofen, Naproxen, meloxicam, diclofenac, Aspirin, etc...)  7 days prior to surgery.    Pre-Op Labs Ordered:  []  No Labs Ordered  [x]  CBC/plts  []  PT/INR, PTT   [x]  Basic metabolic panel  []  Comprehensive metabolic panel  []  T&C for  units PRBC's  []  T&S  [x]  Other: Carcinoembryonic Antigen        Persons present: Patient and Son     At home support: Son    Teaching Method(s) Used:   [X]  One-on-one teaching  [X]  Written materials    Response to teaching/outcomes:  [X]  Voiced understanding  [X]  Described/able to re-state  []  Further instruction/reinforcement needed  []  Returned demonstration    Follow-up:  [X]  Patient instructed to call surgical specialties clinic at  [phone] if has further questions or concerns.    Time spent teaching  patient:  []  0-4 minutes  []  5 minutes  []  6-15 minutes  [X]  16-30 minutes  []  >30 minutes

## 2020-04-09 NOTE — Progress Notes (Signed)
Med list updated per Pre Anesthesia Nurse/ARNP

## 2020-04-09 NOTE — Anesthesia Preprocedure Evaluation (Addendum)
Patient: Olivia Mcconnell    Procedure Information     Date/Time: 04/16/20 0715    Procedure: COLECTOMY, RIGHT, ROBOT-ASSISTED (Right Abdomen)    Location: [facility] 03 / [facility] OR    Surgeons: Ralph Leyden, MD        HPI:   Olivia Mcconnell is an 79 y/o female with history of right breast cancer s/p mastectomy in 1990, papillary thyroid cancer s/p total thyroidectomy in 2011, now presenting with newly diagnosed colon cancer    PMH includes GERD (no longer a problem), HTN, HLD, secondary hypothyroidism,  Osteoporosis, recent 30lb weight loss and iron deficiency anemia    Relevant Problems   Cardio   (+) Essential hypertension      Endo   (+) Postoperative hypothyroidism       Relevant surgical history:   Past Surgical History:   Procedure Laterality Date   . COLONOSCOPY  04/01/2020   . PR APPENDECTOMY  1990s   . PR UNLISTED PROCEDURE BREAST Right 1990   . THYROID SURGERY  2011    total thyroidectomy   . TOTAL KNEE ARTHROPLASTY Left 09/2014         Medications:   Prior to Admission medications    Medication Sig Start Date End Date Taking? Authorizing Provider   amLODIPine 10 MG tablet Take 10 mg by mouth daily.  10/21/15   DeSantis, Lacinda Axon, MD   IRON OR Take 1 tablet by mouth daily. Not yet started: 2021July2    PROVIDER, HISTORICAL   levothyroxine 137 MCG tablet Take 137 mcg by mouth daily on an empty stomach.    PROVIDER, HISTORICAL   metroNIDAZOLE 500 MG tablet Take 2 tablets (1000 mg) by mouth the day before surgery at 1pm, 3pm, and 11pm. 04/09/20   Ralph Leyden, MD   Multiple Vitamin (MULTIVITAMIN OR) Take 1 tablet by mouth daily.    PROVIDER, HISTORICAL   neomycin 500 MG tablet Take 2 tablets (1000 mg) by mouth the day before surgery at 1pm, 3pm, and 11pm. 04/09/20   Ralph Leyden, MD   polyethylene glycol with electrolytes (MoviPrep) 100 g solution Use as instructed the day before surgery at 8am and 10am. 04/09/20   Ralph Leyden, MD       Review of patient's allergies  indicates:  Allergies   Allergen Reactions   . Ciprofloxacin Unknown   . Sulfa Antibiotics Unknown       Social History:   Social History     Tobacco Use   . Smoking status: Never Smoker   . Smokeless tobacco: Never Used   Substance Use Topics   . Alcohol use: Not Currently     Frequency: Monthly or less   . Drug use: Never       Medical History and Review of Systems    Source of information: In person visit.  History of anesthetic complications  (-) History of anesthetic complications.  (-) family history of anesthetic complications.    Functional Status Prior to 3 months ago, walked w/ a cane but no limitation in activites.  Currently unable to walk more than 50 w/o fatigue and needing to stop to rest.  She is using a wheelchair in the med center today  Able to climb 1 flight of stairs without stopping and able to lay flat and still for 30 minutes.    Pulmonary   (+) sleep apnea (had not been using her CPAP until this week.  She plans to  bring it with her to surgery), CPAP    Neuro/Psych ?NPH-Gait disturbance and urinary incontinence neurologist in 2019 thought it might be normal pressure hydrocephalus- did not have further workup because she didn't want a shunt    Cardiovascular Bilat ankle edema    Denies SOB at rest or w/ activities, no chest pain  (+) hypertension, well controlled  (+) hyperlipidemia    HEENT   (+) eye problem (dry eyes)  (+) oral dysphagia (mild sore throat since colonoscopy (?from airway).  no problems eating or drinking)    Musculoskeletal Walks w/ cane for balance issues and generalized weakness    Skin   negative skin ROS    GI/Hepatic/Renal   neg GI/hepatic/renal ROS  (+) genitourinary problem (mixed urinary incontinence, wears full depends)    Endo/Immunology   (+) hypothyroidism    Hematology   (+) anemia (symptomatic anemia)  Oncology   (+) cancer (R breast 1990, papillary thyroid 2011, colon 2021)       Vital signs from 04/09/20 office visit   BP 111/56   Pulse 74   Temp 36.4 C   Ht  5\' 3"  (1.6 m)   Wt 62.1 kg (137 lb)   SpO2 97%   BMI 24.27 kg/m   BSA 1.66 m  Physical Exam  Airway  Mallampati:  II  TM distance:  <6 cm  Neck ROM:  Full  Mouth Opening:  Normal    Dental  normal      Cardiovascular  Rhythm:  Regular  Rate:  Normal   peripheral edema (bilat 2+ ankle edema, nonpitting)    Pulmonary   unlabored  Breath sounds clear to auscultation             Labs: (last year)   No labs identified within the last year    Recent CBC and CMP available in CareEverywhere  Notable for:  Hct 30  Hbg 9  MCV 67  Ferritin 12      Relevant procedures / diagnostic studies:     Anesthesia Plan    PAT Discussion  PAT Anesthesia Plan: general    Supervising Provider - Day of Procedure  ASA 3     Planned Anesthetic Type: general    Anesthetic plan and risks discussed with patient and child.  Use of blood products discussed with patient who consented to blood products.         Risk Calculators / Scores:     PONV: Intermediate Risk            (!)  Female patient    (!)  Non-smoker        Criteria that do not apply:    History of PONV    History of motion sickness    Intended opioid administration

## 2020-04-09 NOTE — Progress Notes (Addendum)
TELEPHONE ENCOUNTER    SERVICE: GENERAL SURGERY S  DATE OF CALL: 04/09/20  TIME OF CALL: 10:26 PM    CALLER: Patient's son  U NUMBER: [id number]  PATIENT NAME: Olivia Mcconnell  CALLBACK NUMBER: [phone]    ATTENDING/SURGEON OF RECORD: Dr. Jolyn Nap    OPERATION: planned for robotic right hemicolectomy on 04/16/20  DATE OF SURGERY: 04/16/20 (future)  DATE OF DISCHARGE:  n/a    REASON FOR CALL:   Question regarding preoperative prescriptions and preparation    HISTORY:   [name] is a 79 year old female for evaluation of cecal mass noted on colonoscopy and CT noted to invade the ileocecal valve. The patient was seen in clinic today with Dr. Windell Norfolk and the decision was made to proceed with a robotic right hemicolectomy on 04/16/20.  The patient had prescriptions for her bowel prep sent to local pharmacy, and her son is calling from the pharmacy due to questions about the "nutritional drinks" that the patient is supposed to have the 6 days prior to surgery according to the ERAS protocol.  The patient states that a prescription was not sent for this and the drink is not available at the pharmacy, and he is unsure how to obtain his nutritional supplement in time.    RECOMMENDATIONS:   Reassured the patient's son that missing drinking some of this nutritional drink should not significantly impact surgery. He realized that she was given "[employer] Advanced Recovery" by another colorectal surgeon, which appears to match the description on the preop instruction sheet provided in clinic today. Confirmed that they were also able to pick up the bowel prep from the pharmacy. We reviewed the instructions and the patient's son verbalized his understanding.    Maryan Puls, MD  PGY-2, [facility] [place]

## 2020-04-09 NOTE — Progress Notes (Signed)
REVIEW OF SYSTEMS:  Review of Systems includes the following responses to our health assessment questionnaire.      ANY CURRENT PROBLEMS WITH YOUR HEALTH? ADDITIONAL INFORMATION   GENERAL Recent Weight Gain/Loss  Fatigue/Trouble Sleeping  Fever/Chills/Night Sweats  Have you ever had anesthesia problems?  Family history of anesthesia problems?  NO  NO  NO   No    No  Current   Ht & Wt:  Ht 5\' 3"  (1.6 m)  Wt 137 lb (62.143 kg)  Body mass index is 24.27 kg/m.   EAR/NOSE/MOUTH/  THROAT Hearing Loss/Hearing Aid  Ear Problems  Nose Problems  Mouth or Throat Problems  Nose bleeds/Sinus Problems  Dental Problems/Dentures  Loose or Missing Tooth/Teeth NO  NO  NO  NO  NO  NO  NO    EYE Wear glasses/contacts  Eye Problems  Yellowing of white part of eyes NO  NO  NO    NEUROLOGY Problems with vision  Headaches/Dizziness  Seizures  Fainting/Unconsciousness  Numbness/Tingling/Weakness NO  NO  NO  NO  NO    HEART Chest Pain  Heart Murmur  High Blood Pressure  Recent Heart Attack/MI  Artificial Heart Valve(s)  Able to walk two flights of stairs NO  NO  YES  NO  NO  NO    LUNG Shortness of breath (day or night)  Asthma  Sleep Apnea/Snoring  Difficulty sleeping  Lung problems  Recent cold or cough NO  NO  NO  NO  NO  NO    SKIN Masses/Bumps/Lumps  Rashes  Lesions/Cuts/Scrapes  Wounds/Blisters NO  NO  NO  NO    STOMACH/  GASTROINTESINAL/COLON/RECTUM Stomach/Abdominal Pain  Hiatal hernia  Heartburn/Indigestion  Nausea/Vomiting  Diarrhea  Constipation  Blood in Stool  Jaundice/Yellowing of skin  Hepatitis  NO  NO  NO  NO  NO  NO  NO  NO  NO                 Type:    MUSCLES/BONES       Joint Pain  Back Pain/Disc Disease  Sprain/Strain  Stiffness/Arthritis  Artificial joint(s)  Other physical disability NO  NO  NO  YES  NO  NO Location:           Type:    URINARY TRACT        Female/Female Issues  REPRODUCTION   Urinary Problems  Pain with urination  Kidney Problems/Kidney Stones    Female/Female Specific Problems    Females-Could you be  pregnant? NO  NO  NO    NO    NO    BLOOD/LYMPH Bleeding Problems  Anemia  Swollen or enlarged glands NO  NO  NO    IMMUNOLOGICAL Hay Fever  Allergies  HIV/Aids NO  NO  NO    ENDOCRINE Heat/Cold Intolerance  Hyperthyroid/Hypothyroid  Increased thirst/Diabetes NO  YES  NO    MENTAL HEALTH Anxiety/Depression  Psychiatric Care  Other Concerns NO  NO  NO

## 2020-04-10 ENCOUNTER — Other Ambulatory Visit (HOSPITAL_BASED_OUTPATIENT_CLINIC_OR_DEPARTMENT_OTHER): Payer: Self-pay | Admitting: Family Medicine

## 2020-04-10 DIAGNOSIS — Z20822 Contact with and (suspected) exposure to covid-19: Secondary | ICD-10-CM

## 2020-04-10 DIAGNOSIS — Z01812 Encounter for preprocedural laboratory examination: Secondary | ICD-10-CM

## 2020-04-13 ENCOUNTER — Telehealth (HOSPITAL_BASED_OUTPATIENT_CLINIC_OR_DEPARTMENT_OTHER): Payer: Self-pay | Admitting: Surgery

## 2020-04-13 ENCOUNTER — Encounter (HOSPITAL_BASED_OUTPATIENT_CLINIC_OR_DEPARTMENT_OTHER): Payer: Medicare Other | Admitting: Nurse Practitioner

## 2020-04-13 DIAGNOSIS — K635 Polyp of colon: Secondary | ICD-10-CM

## 2020-04-13 DIAGNOSIS — C180 Malignant neoplasm of cecum: Secondary | ICD-10-CM

## 2020-04-13 LAB — EXTERNAL PATHOLOGY REVIEW

## 2020-04-13 LAB — CARCINOEMBRYONIC ANTIGEN: Carcinoembryonic Antigen: 2.5 ng/mL (ref 0.0–5.0)

## 2020-04-13 NOTE — Telephone Encounter (Signed)
Called Olivia Mcconnell to inform him of labs that still need to be collected.  Patient has appointment with PCP today and will get CBC and CMP drawn then.

## 2020-04-14 ENCOUNTER — Ambulatory Visit (HOSPITAL_BASED_OUTPATIENT_CLINIC_OR_DEPARTMENT_OTHER): Payer: Medicare Other

## 2020-04-14 DIAGNOSIS — Z20822 Contact with and (suspected) exposure to covid-19: Secondary | ICD-10-CM

## 2020-04-14 DIAGNOSIS — Z01812 Encounter for preprocedural laboratory examination: Secondary | ICD-10-CM

## 2020-04-14 LAB — COVID-19 CORONAVIRUS QUALITATIVE PCR: COVID-19 Coronavirus Qual PCR Result: NOT DETECTED

## 2020-04-14 NOTE — Progress Notes (Signed)
Patient was seen on 04/14/2020 at the [facility] 19 TEST S[facility] drive [place] site where a sample of Anterior Nares collection was taken. The specimen was sent to the [facility] lab for COVID-19 testing.  Patient will be informed of test results within 48 hours.  Patient received informational instructions on self-care.    The specimen was collected by: Niya Chacko, RN

## 2020-04-14 NOTE — Patient Instructions (Signed)
Evaluation for COVID-19  Testing, Result Information, Symptom Management    Who is being tested for COVID-19?  [facility] is testing patients for COVID-19 for:  1. Patients who have symptoms that may be related to COVID-19  2. Patients who were exposed to COVID-19  3. Patients who require testing for travel or to return to work  4. Patients who do not have symptoms, but have an upcoming surgery or procedure that requires routine COVID-19 testing beforehand    If a COVID-19 test was ordered, what number do I call to set up a swabbing appointment?  You may call the [facility] COVID-19 Line at [phone].    If you are experiencing symptoms, what do we believe you have?  You have a viral syndrome, which may include symptoms like muscle aches, fevers, chills, runny nose, cough, sneezing, sore throat, vomiting or diarrhea.     SARS-CoV-2, the virus that causes COVID-19, is one of the potential viruses you may have. You may be just as likely to have a different viral infection such as the common cold or flu.    Most patients with COVID-19 have mild symptoms and recover on their own. Resting, staying hydrated, and sleeping are typically helpful. As of today's visit, you are well enough to go home and treat your symptoms with oral fluids and medicines for fevers, cough, pain, etc. If your symptoms worsen, you should seek additional medical care.    Why is COVID-19 testing being performed before my surgery/procedure?  The safety of our patients and staff is our top priority. We are performing COVID-19 testing before certain surgeries and procedures to maintain everyone's safety and help prevent others from getting infected or exposed.    When will I receive results for my COVID-19 test?  If COVID-19 testing is performed, the results should be available in 1-2 days. You may find testing follow-up instructions here: [email or web address]    Who can I contact for questions?  Call  [phone] for any COVID-19 questions or if your symptoms are worsening. Please allow 48 hours for results to finalize before contacting us about your result status.    How do I receive results?  Please do not contact the Emergency Department or clinic for results of this test. Please wait to be contacted as outlined below and do not go to your doctor's office for results.    IF THE RESULT IS POSITIVE OR INCONCLUSIVE  A member of the [facility] team will call you for further discussion. You may also view your result in eCare or through a QR code you may receive at your testing site.    IF THE RESULT IS NEGATIVE  You will receive this information by phone, via eCare, or a QR code you may receive at your testing site.    Pre-surgical evaluations  A member of your surgery team will review your results and contact you if needed. Please remain isolated until your surgery date to reduce the risk of COVID-19 exposure.    eCare  If you are a  Medicine patient, eCare ([email or web address]) is the fastest way to receive your results. Results will be released to eCare within one hour of being posted in our system, and you may receive your results before we are able to contact you.    QR Code  You may receive a QR code label at the time of your test. If you do not have an eCare account, you may use the QR code label to view   your results at [email or web address]. You will not receive a notification when your result is ready to view on this site, but you can visit the site as many times as you wish to check the result status.    What do I do while I wait for my test results?   Stay home except to get medical care. Do not return to work or your regular activities outside of home. Remain isolated until you receive your results.    After receiving your results, follow the instructions here: [email or web address]    Please follow the precautions below:   Stay home except to  get medical care.     Do not go to work, school, or public areas. Avoid using public transportation, ride-sharing, or taxis.   Separate yourself from other people in your home as much as possible.   Stay in a specific room and away from other people in your home as much as possible. Use a separate bathroom if possible.   Do not share household items with other people in your home.   This includes sharing dishes, drinking glasses, cups, eating utensils, towels or bedding. After using these items, they should be washed thoroughly with soap and water.   Clean all "high-touch" surfaces regularly.   This includes counters, tabletops, doorknobs, bathroom fixtures, toilets, phones, keyboards, tablets and bedside tables. Also, clean any surfaces that may have blood, stool or body fluids on them. Use a household cleaning spray or wipe, according to the label instructions.   Cover your coughs and sneezes with a tissue, mask or the inside of your elbow.   Throw used tissues in a lined trash can; immediately wash your hands with soap and water for at least 20 seconds or clean your hands with an alcohol-based hand sanitizer that contains at least 60% alcohol. Soap and water should be used if hands are visibly dirty.   When seeking care at a healthcare facility:   Seek prompt medical attention if your illness is worsening (e.g., difficulty breathing).   When possible, call the healthcare provider before arriving.   Put on a facemask before you enter the facility.   If possible, put on a facemask before the ambulance or paramedics arrive.   These steps will help the healthcare provider's office prevent other people from getting infected or exposed.      Please see the resources below for more information  Information Lines  [place] [facility] COVID-19 Call Center: 1-800-525-0127   [facility] COVID-19 Line: [phone]    [facility] Websites  COVID-19  Information  [email or web address]    [place] Department of Health Websites  General Facts on COVID-19  doh.[place].gov/Emergencies/NovelCoronavirusOutbreak2020/FactSheet    What to do if you were potentially exposed to someone with confirmed coronavirus disease (COVID-19)  doh.wa.gov/Portals/1/Documents/1600/coronavirus/COVIDexposed.pdf    Centers for Disease Control and Prevention (CDC) Websites  COVID-19 FAQs:  cdc.gov/coronavirus/2019-ncov/faq.html    What to do if you are sick: cdc.gov/coronavirus/2019-ncov/if-you-are-sick/steps-when-sick.html

## 2020-04-15 ENCOUNTER — Encounter (HOSPITAL_BASED_OUTPATIENT_CLINIC_OR_DEPARTMENT_OTHER): Payer: Self-pay | Admitting: Unknown Physician Specialty

## 2020-04-15 NOTE — H&P (Signed)
Orders entered    Marcelo Baldy, MD

## 2020-04-15 NOTE — H&P (Signed)
Orders only

## 2020-04-16 ENCOUNTER — Inpatient Hospital Stay (HOSPITAL_COMMUNITY)
Admission: RE | Disposition: A | Discharge status: Skilled Nursing Facility | Payer: Medicare Other | Source: Home / Self Care | Attending: Surgery

## 2020-04-16 ENCOUNTER — Other Ambulatory Visit (HOSPITAL_COMMUNITY): Payer: Self-pay

## 2020-04-16 ENCOUNTER — Inpatient Hospital Stay (HOSPITAL_COMMUNITY): Payer: Medicare Other | Admitting: Surgery

## 2020-04-16 ENCOUNTER — Ambulatory Visit (HOSPITAL_COMMUNITY): Payer: Self-pay

## 2020-04-16 ENCOUNTER — Encounter (HOSPITAL_COMMUNITY): Payer: Self-pay | Admitting: Anesthesiology

## 2020-04-16 ENCOUNTER — Ambulatory Visit (HOSPITAL_COMMUNITY): Payer: Medicare Other | Admitting: Anesthesiology

## 2020-04-16 ENCOUNTER — Inpatient Hospital Stay
Admission: RE | Admit: 2020-04-16 | Discharge: 2020-04-20 | DRG: 330 | Disposition: A | Discharge status: Skilled Nursing Facility | Payer: Medicare Other | Source: Skilled Nursing Facility | Attending: Surgery | Admitting: Surgery

## 2020-04-16 DIAGNOSIS — I10 Essential (primary) hypertension: Secondary | ICD-10-CM | POA: Diagnosis present

## 2020-04-16 DIAGNOSIS — C772 Secondary and unspecified malignant neoplasm of intra-abdominal lymph nodes: Secondary | ICD-10-CM

## 2020-04-16 DIAGNOSIS — C73 Malignant neoplasm of thyroid gland: Secondary | ICD-10-CM

## 2020-04-16 DIAGNOSIS — Z881 Allergy status to other antibiotic agents status: Secondary | ICD-10-CM

## 2020-04-16 DIAGNOSIS — H409 Unspecified glaucoma: Secondary | ICD-10-CM | POA: Diagnosis present

## 2020-04-16 DIAGNOSIS — M810 Age-related osteoporosis without current pathological fracture: Secondary | ICD-10-CM | POA: Diagnosis present

## 2020-04-16 DIAGNOSIS — Z96652 Presence of left artificial knee joint: Secondary | ICD-10-CM | POA: Diagnosis present

## 2020-04-16 DIAGNOSIS — C180 Malignant neoplasm of cecum: Secondary | ICD-10-CM | POA: Diagnosis present

## 2020-04-16 DIAGNOSIS — C182 Malignant neoplasm of ascending colon: Secondary | ICD-10-CM

## 2020-04-16 DIAGNOSIS — Z853 Personal history of malignant neoplasm of breast: Secondary | ICD-10-CM

## 2020-04-16 DIAGNOSIS — Z9011 Acquired absence of right breast and nipple: Secondary | ICD-10-CM

## 2020-04-16 DIAGNOSIS — D509 Iron deficiency anemia, unspecified: Secondary | ICD-10-CM | POA: Diagnosis present

## 2020-04-16 DIAGNOSIS — G912 (Idiopathic) normal pressure hydrocephalus: Secondary | ICD-10-CM | POA: Diagnosis present

## 2020-04-16 DIAGNOSIS — E890 Postprocedural hypothyroidism: Secondary | ICD-10-CM | POA: Diagnosis present

## 2020-04-16 DIAGNOSIS — N3000 Acute cystitis without hematuria: Secondary | ICD-10-CM

## 2020-04-16 DIAGNOSIS — Z882 Allergy status to sulfonamides status: Secondary | ICD-10-CM

## 2020-04-16 DIAGNOSIS — Z85850 Personal history of malignant neoplasm of thyroid: Secondary | ICD-10-CM

## 2020-04-16 DIAGNOSIS — N3946 Mixed incontinence: Secondary | ICD-10-CM | POA: Diagnosis present

## 2020-04-16 DIAGNOSIS — E785 Hyperlipidemia, unspecified: Secondary | ICD-10-CM | POA: Diagnosis present

## 2020-04-16 LAB — GLUCOSE POC, ~~LOC~~
Glucose (POC): 150 mg/dL — ABNORMAL HIGH (ref 62–125)
Glucose (POC): 160 mg/dL — ABNORMAL HIGH (ref 62–125)
Glucose (POC): 113 mg/dL (ref 62–125)
Glucose (POC): 149 mg/dL — ABNORMAL HIGH (ref 62–125)
Glucose (POC): 172 mg/dL — ABNORMAL HIGH (ref 62–125)
Glucose (POC): 205 mg/dL — ABNORMAL HIGH (ref 62–125)
Glucose (POC): 212 mg/dL — ABNORMAL HIGH (ref 62–125)
Glucose (POC): 139 mg/dL — ABNORMAL HIGH (ref 62–125)
Glucose (POC): 135 mg/dL — ABNORMAL HIGH (ref 62–125)
Glucose (POC): 132 mg/dL — ABNORMAL HIGH (ref 62–125)
Glucose (POC): 173 mg/dL — ABNORMAL HIGH (ref 62–125)
Glucose (POC): 172 mg/dL — ABNORMAL HIGH (ref 62–125)
Glucose (POC): 234 mg/dL — ABNORMAL HIGH (ref 62–125)

## 2020-04-16 LAB — BLOOD TYPE CONFIRMATION: ABO/Rh: O POS

## 2020-04-16 LAB — CBC (HEMOGRAM)
Hematocrit: 27 % — ABNORMAL LOW (ref 36–45)
Hemoglobin: 7.7 g/dL — ABNORMAL LOW (ref 11.5–15.5)
MCH: 18.4 pg — ABNORMAL LOW (ref 27.3–33.6)
MCHC: 28.3 g/dL — ABNORMAL LOW (ref 32.2–36.5)
MCV: 65 fL — ABNORMAL LOW (ref 81–98)
Platelet Count: 427 10*3/uL — ABNORMAL HIGH (ref 150–400)
RBC: 4.19 10*6/uL (ref 3.80–5.00)
RDW-CV: 18.3 % — ABNORMAL HIGH (ref 11.6–14.4)
WBC: 8.78 10*3/uL (ref 4.3–10.0)

## 2020-04-16 SURGERY — COLECTOMY, RIGHT, ROBOT-ASSISTED, XI
Anesthesia: General | Site: Abdomen | Laterality: Right | Wound class: Class II/ Clean Contaminated

## 2020-04-16 MED ORDER — DEXTROSE 50 % IV SOLN
25.0000 mL | INTRAVENOUS | Status: DC | PRN
Start: 2020-04-16 — End: 2020-04-16

## 2020-04-16 MED ORDER — DEXAMETHASONE SOD PHOSPHATE PF 10 MG/ML IJ SOLN
INTRAMUSCULAR | Status: DC | PRN
Start: 2020-04-16 — End: 2020-04-16
  Administered 2020-04-16: 5 mg via INTRAVENOUS

## 2020-04-16 MED ORDER — EPHEDRINE SULFATE-NACL 25-0.9 MG/5ML-% IV SOSY
PREFILLED_SYRINGE | INTRAVENOUS | Status: DC | PRN
Start: 2020-04-16 — End: 2020-04-16
  Administered 2020-04-16: 5 mg via INTRAVENOUS
  Administered 2020-04-16: 10 mg via INTRAVENOUS
  Administered 2020-04-16: 5 mg via INTRAVENOUS

## 2020-04-16 MED ORDER — ACETAMINOPHEN 500 MG OR TABS
1000.0000 mg | ORAL_TABLET | Freq: Four times a day (QID) | ORAL | Status: DC
Start: 2020-04-16 — End: 2020-04-20
  Administered 2020-04-16 – 2020-04-20 (×12): 1000 mg via ORAL
  Filled 2020-04-16 (×13): qty 2

## 2020-04-16 MED ORDER — METRONIDAZOLE IN NACL 5-0.79 MG/ML-% IV SOLN
500.0000 mg | INTRAVENOUS | Status: AC
Start: 2020-04-16 — End: 2020-04-16
  Administered 2020-04-16: 500 mg via INTRAVENOUS
  Filled 2020-04-16: qty 100

## 2020-04-16 MED ORDER — NALOXONE HCL 0.4 MG/ML IJ SOLN
0.0400 mg | INTRAMUSCULAR | Status: DC | PRN
Start: 2020-04-16 — End: 2020-04-16

## 2020-04-16 MED ORDER — DEXTROSE IN LACTATED RINGERS 5 % IV SOLN
1.0000 mL/kg/h | INTRAVENOUS | Status: DC
Start: 2020-04-16 — End: 2020-04-18
  Administered 2020-04-16 – 2020-04-17 (×3): 1 mL/kg/h via INTRAVENOUS
  Filled 2020-04-16 (×2): qty 1000

## 2020-04-16 MED ORDER — ALVIMOPAN 12 MG OR CAPS
12.0000 mg | ORAL_CAPSULE | Freq: Two times a day (BID) | ORAL | Status: DC
Start: 2020-04-16 — End: 2020-04-19
  Administered 2020-04-16 – 2020-04-17 (×2): 12 mg via ORAL
  Filled 2020-04-16 (×3): qty 1

## 2020-04-16 MED ORDER — NALOXEGOL OXALATE 25 MG OR TABS
25.0000 mg | ORAL_TABLET | Freq: Every day | ORAL | Status: DC
Start: 2020-04-16 — End: 2020-04-16
  Administered 2020-04-16: 25 mg via ORAL

## 2020-04-16 MED ORDER — STERILE WATER FOR IRRIGATION IR SOLN
Status: DC | PRN
Start: 2020-04-16 — End: 2020-04-16
  Administered 2020-04-16: 3000 mL

## 2020-04-16 MED ORDER — ACETAMINOPHEN 10 MG/ML IV SOLN
INTRAVENOUS | Status: DC | PRN
Start: 2020-04-16 — End: 2020-04-16
  Administered 2020-04-16: 1000 mg via INTRAVENOUS

## 2020-04-16 MED ORDER — ALVIMOPAN 12 MG OR CAPS
12.0000 mg | ORAL_CAPSULE | ORAL | Status: DC
Start: 2020-04-16 — End: 2020-04-16

## 2020-04-16 MED ORDER — PHENYLEPHRINE HCL-NACL 25-0.9 MG/250ML-% IV SOLN
INTRAVENOUS | Status: AC
Start: 2020-04-16 — End: ?
  Filled 2020-04-16: qty 250

## 2020-04-16 MED ORDER — ACETAMINOPHEN 500 MG OR TABS
1000.0000 mg | ORAL_TABLET | ORAL | Status: AC
Start: 2020-04-16 — End: 2020-04-16
  Administered 2020-04-16: 1000 mg via ORAL

## 2020-04-16 MED ORDER — DEXTROSE 50 % IV SOLN
25.0000 mL | INTRAVENOUS | Status: DC | PRN
Start: 2020-04-16 — End: 2020-04-20

## 2020-04-16 MED ORDER — FENTANYL CITRATE (PF) 50 MCG/ML IJ SOLN WRAPPER (ANESTHESIA OSM ONLY)
INTRAMUSCULAR | Status: DC | PRN
Start: 2020-04-16 — End: 2020-04-16
  Administered 2020-04-16 (×2): 100 ug via INTRAVENOUS
  Administered 2020-04-16: 150 ug via INTRAVENOUS

## 2020-04-16 MED ORDER — GABAPENTIN 300 MG OR CAPS
300.0000 mg | ORAL_CAPSULE | Freq: Three times a day (TID) | ORAL | Status: AC
Start: 2020-04-16 — End: 2020-04-19
  Administered 2020-04-16 – 2020-04-19 (×8): 300 mg via ORAL
  Filled 2020-04-16 (×9): qty 1

## 2020-04-16 MED ORDER — FENTANYL CITRATE (PF) 250 MCG/5ML IJ SOLN
12.5000 ug | INTRAMUSCULAR | Status: DC | PRN
Start: 2020-04-16 — End: 2020-04-16

## 2020-04-16 MED ORDER — HEPARIN SODIUM (PORCINE) 5000 UNIT/ML IJ SOLN
INTRAMUSCULAR | Status: AC
Start: 2020-04-16 — End: ?
  Filled 2020-04-16: qty 1

## 2020-04-16 MED ORDER — ALBUTEROL SULFATE (2.5 MG/3ML) 0.083% IN NEBU
2.5000 mg | INHALATION_SOLUTION | Freq: Once | RESPIRATORY_TRACT | Status: DC
Start: 2020-04-16 — End: 2020-04-16

## 2020-04-16 MED ORDER — INSULIN REGULAR 100 UNITS IN NS 100 ML INFUSION (PKG PREMIX)
INJECTION | Status: DC | PRN
Start: 2020-04-16 — End: 2020-04-16
  Administered 2020-04-16: 3 [IU]/h via INTRAVENOUS

## 2020-04-16 MED ORDER — LACTATED RINGERS IV SOLN
INTRAVENOUS | Status: DC | PRN
Start: 2020-04-16 — End: 2020-04-16

## 2020-04-16 MED ORDER — INSULIN REGULAR 100 UNITS IN NS 100 ML INFUSION (PKG PREMIX)
INJECTION | Status: AC
Start: 2020-04-16 — End: ?
  Filled 2020-04-16: qty 100

## 2020-04-16 MED ORDER — GLUCAGON HCL RDNA (DIAGNOSTIC) 1 MG IJ SOLR
1.0000 mg | INTRAMUSCULAR | Status: DC | PRN
Start: 2020-04-16 — End: 2020-04-16

## 2020-04-16 MED ORDER — ATROPINE SULFATE 1 MG/10ML IJ SOSY
0.5000 mg | PREFILLED_SYRINGE | INTRAMUSCULAR | Status: DC | PRN
Start: 2020-04-16 — End: 2020-04-16

## 2020-04-16 MED ORDER — HEPARIN SODIUM (PORCINE) 5000 UNIT/ML IJ SOLN
5000.0000 [IU] | INTRAMUSCULAR | Status: AC
Start: 2020-04-16 — End: 2020-04-16
  Administered 2020-04-16: 5000 [IU] via SUBCUTANEOUS

## 2020-04-16 MED ORDER — ROCURONIUM BROMIDE 50 MG/5ML IV SOLN
INTRAVENOUS | Status: AC
Start: 2020-04-16 — End: ?
  Filled 2020-04-16: qty 15

## 2020-04-16 MED ORDER — NALOXEGOL OXALATE 25 MG OR TABS
ORAL_TABLET | ORAL | Status: AC
Start: 2020-04-16 — End: ?
  Filled 2020-04-16: qty 1

## 2020-04-16 MED ORDER — INSULIN LISPRO 100 UNIT/ML IJ SOLN
0.0000 [IU] | Freq: Every evening | INTRAMUSCULAR | Status: DC
Start: 2020-04-16 — End: 2020-04-20
  Administered 2020-04-16: 2 [IU] via SUBCUTANEOUS
  Filled 2020-04-16: qty 2

## 2020-04-16 MED ORDER — PROPOFOL 500 MG/50ML IV EMUL INFUSION
INTRAVENOUS | Status: AC
Start: 2020-04-16 — End: ?
  Filled 2020-04-16: qty 300

## 2020-04-16 MED ORDER — ONDANSETRON HCL 4 MG/2ML IJ SOLN
4.0000 mg | INTRAMUSCULAR | Status: DC | PRN
Start: 2020-04-16 — End: 2020-04-16

## 2020-04-16 MED ORDER — SYRINGE (ANESTHESIA OSM ONLY)
INTRAVENOUS | Status: DC | PRN
Start: 2020-04-16 — End: 2020-04-16
  Administered 2020-04-16: 120 ug/kg/min via INTRAVENOUS

## 2020-04-16 MED ORDER — HYDROMORPHONE 1 MG/ML PCA CLINICIAN BOLUS FROM PUMP
0.2000 mg | INTRAVENOUS | Status: DC | PRN
Start: 2020-04-16 — End: 2020-04-17

## 2020-04-16 MED ORDER — DEXTROSE 50 % IV SOLN
50.0000 mL | INTRAVENOUS | Status: DC | PRN
Start: 2020-04-16 — End: 2020-04-16

## 2020-04-16 MED ORDER — HYDROMORPHONE HCL 2 MG/ML IJ SOLN
INTRAMUSCULAR | Status: AC
Start: 2020-04-16 — End: ?
  Filled 2020-04-16: qty 1

## 2020-04-16 MED ORDER — GABAPENTIN 300 MG OR CAPS
300.0000 mg | ORAL_CAPSULE | ORAL | Status: AC
Start: 2020-04-16 — End: 2020-04-16
  Administered 2020-04-16: 300 mg via ORAL

## 2020-04-16 MED ORDER — NALOXONE HCL 0.4 MG/ML IJ SOLN
0.0800 mg | INTRAMUSCULAR | Status: DC | PRN
Start: 2020-04-16 — End: 2020-04-20

## 2020-04-16 MED ORDER — INSULIN LISPRO 100 UNIT/ML IJ SOLN
0.0000 [IU] | Freq: Three times a day (TID) | INTRAMUSCULAR | Status: DC
Start: 2020-04-17 — End: 2020-04-20
  Administered 2020-04-17 (×3): 2 [IU] via SUBCUTANEOUS
  Filled 2020-04-16 (×3): qty 2

## 2020-04-16 MED ORDER — DEXTROSE 50 % IV SOLN
50.0000 mL | INTRAVENOUS | Status: DC | PRN
Start: 2020-04-16 — End: 2020-04-20

## 2020-04-16 MED ORDER — PHENYLEPHRINE HCL-NACL 25-0.9 MG/250ML-% IV SOLN
INTRAVENOUS | Status: DC | PRN
Start: 2020-04-16 — End: 2020-04-16
  Administered 2020-04-16: .5 ug/kg/min via INTRAVENOUS

## 2020-04-16 MED ORDER — FENTANYL CITRATE (PF) 250 MCG/5ML IJ SOLN
INTRAMUSCULAR | Status: AC
Start: 2020-04-16 — End: ?
  Filled 2020-04-16: qty 10

## 2020-04-16 MED ORDER — HYDROMORPHONE HCL-NACL 50-0.9 MG/50ML-% IV SOLN
INTRAVENOUS | Status: AC
Start: 2020-04-16 — End: ?
  Filled 2020-04-16: qty 50

## 2020-04-16 MED ORDER — ALBUMIN HUMAN 5 % IV SOLN
INTRAVENOUS | Status: AC
Start: 2020-04-16 — End: ?
  Filled 2020-04-16: qty 250

## 2020-04-16 MED ORDER — LACTATED RINGERS IV SOLN
100.0000 mL/h | INTRAVENOUS | Status: DC
Start: 2020-04-16 — End: 2020-04-16

## 2020-04-16 MED ORDER — ACETAMINOPHEN 500 MG OR TABS
ORAL_TABLET | ORAL | Status: AC
Start: 2020-04-16 — End: ?
  Filled 2020-04-16: qty 2

## 2020-04-16 MED ORDER — GABAPENTIN 300 MG OR CAPS
ORAL_CAPSULE | ORAL | Status: AC
Start: 2020-04-16 — End: ?
  Filled 2020-04-16: qty 1

## 2020-04-16 MED ORDER — GLUCAGON HCL RDNA (DIAGNOSTIC) 1 MG IJ SOLR
1.0000 mg | INTRAMUSCULAR | Status: DC | PRN
Start: 2020-04-16 — End: 2020-04-20

## 2020-04-16 MED ORDER — CEFAZOLIN SODIUM-DEXTROSE 2-4 GM/100ML-% IV SOLN
2.0000 g | INTRAVENOUS | Status: AC
Start: 2020-04-16 — End: 2020-04-16
  Administered 2020-04-16 (×2): 2 g via INTRAVENOUS
  Filled 2020-04-16: qty 100

## 2020-04-16 MED ORDER — PROPOFOL 10 MG/ML IV EMUL WRAPPER (OSM ONLY)
INTRAVENOUS | Status: DC | PRN
Start: 2020-04-16 — End: 2020-04-16
  Administered 2020-04-16: 160 mg via INTRAVENOUS
  Administered 2020-04-16: 30 mg via INTRAVENOUS
  Administered 2020-04-16: 40 mg via INTRAVENOUS

## 2020-04-16 MED ORDER — LABETALOL HCL 5 MG/ML IV SOLN
2.5000 mg | INTRAVENOUS | Status: DC | PRN
Start: 2020-04-16 — End: 2020-04-16

## 2020-04-16 MED ORDER — LIDOCAINE HCL 2 % IJ SOLN
INTRAMUSCULAR | Status: DC | PRN
Start: 2020-04-16 — End: 2020-04-16
  Administered 2020-04-16: 100 mg via INTRAVENOUS

## 2020-04-16 MED ORDER — BUPIVACAINE LIPOSOME 1.3 % IJ SUSP
20.0000 mL | INTRAMUSCULAR | Status: AC
Start: 2020-04-16 — End: 2020-04-16
  Administered 2020-04-16 (×2): 20 mL via INTRAMUSCULAR
  Filled 2020-04-16: qty 20

## 2020-04-16 MED ORDER — ROCURONIUM BROMIDE 50 MG/5ML IV SOLN
INTRAVENOUS | Status: DC | PRN
Start: 2020-04-16 — End: 2020-04-16
  Administered 2020-04-16 (×2): 20 mg via INTRAVENOUS
  Administered 2020-04-16: 10 mg via INTRAVENOUS
  Administered 2020-04-16: 50 mg via INTRAVENOUS

## 2020-04-16 MED ORDER — GLUCAGON HCL RDNA (DIAGNOSTIC) 1 MG IJ SOLR
0.5000 mg | INTRAMUSCULAR | Status: DC | PRN
Start: 2020-04-16 — End: 2020-04-16

## 2020-04-16 MED ORDER — HYDROMORPHONE HCL 2 MG/ML IJ SOLN
INTRAMUSCULAR | Status: DC | PRN
Start: 2020-04-16 — End: 2020-04-16
  Administered 2020-04-16 (×2): 0.5 mg via INTRAVENOUS

## 2020-04-16 MED ORDER — INDOCYANINE GREEN 25 MG IV SOLR
INTRAVENOUS | Status: DC | PRN
Start: 2020-04-16 — End: 2020-04-16
  Administered 2020-04-16: 12.5 mg via INTRAVENOUS

## 2020-04-16 MED ORDER — SUGAMMADEX SODIUM 200 MG/2ML IV SOLN
INTRAVENOUS | Status: DC | PRN
Start: 2020-04-16 — End: 2020-04-16
  Administered 2020-04-16: 250 mg via INTRAVENOUS

## 2020-04-16 MED ORDER — ACETAMINOPHEN 325 MG OR TABS
650.0000 mg | ORAL_TABLET | ORAL | Status: DC
Start: 2020-04-16 — End: 2020-04-16

## 2020-04-16 MED ORDER — GLUCAGON HCL RDNA (DIAGNOSTIC) 1 MG IJ SOLR
0.5000 mg | INTRAMUSCULAR | Status: DC | PRN
Start: 2020-04-16 — End: 2020-04-20

## 2020-04-16 MED ORDER — INSULIN REGULAR 100 UNITS IN NS 100 ML INFUSION (PKG PREMIX)
0.0000 [IU]/h | INJECTION | Status: DC
Start: 2020-04-16 — End: 2020-04-16

## 2020-04-16 MED ORDER — HYDROMORPHONE HCL 2 MG/ML IJ SOLN
0.2000 mg | INTRAMUSCULAR | Status: DC | PRN
Start: 2020-04-16 — End: 2020-04-16

## 2020-04-16 MED ORDER — BUPIVACAINE HCL (PF) 0.25 % IJ SOLN
INTRAMUSCULAR | Status: DC | PRN
Start: 2020-04-16 — End: 2020-04-16
  Administered 2020-04-16: 30 mL via INTRAMUSCULAR

## 2020-04-16 MED ORDER — HYDROMORPHONE 1 MG/ML IN NS 50 ML PCA BAG (PKG PREMIX)
PREFILLED_SYRINGE | INTRAVENOUS | Status: DC
Start: 2020-04-16 — End: 2020-04-17

## 2020-04-16 MED ORDER — HEPARIN SODIUM (PORCINE) 5000 UNIT/ML IJ SOLN
5000.0000 [IU] | Freq: Three times a day (TID) | INTRAMUSCULAR | Status: DC
Start: 2020-04-16 — End: 2020-04-19
  Administered 2020-04-16 – 2020-04-19 (×9): 5000 [IU] via SUBCUTANEOUS
  Filled 2020-04-16 (×8): qty 1

## 2020-04-16 SURGICAL SUPPLY — 52 items
ADHESIVE SKIN DERMABOND .36 ML (Closure Device) ×6 IMPLANT
APPLICATOR PREP CHLORAPREP 26ML CLR 2% CHG (Prep) ×4 IMPLANT
BLADE SURG BARD-PARKER CARBON STEEL 10 (Blade) ×4 IMPLANT
CATHETER TRAY COMPLETE CARE 16FR FOLEY (Catheter) ×2
CATHETER TRAY COMPLETE CARE TEMPERATURE SENSING 16FR FOLEY (Catheter) ×1 IMPLANT
CONTAINER SPEC 86OZ NS (Other) ×2 IMPLANT
COVER ENDOSCOPE TIP HOT SHEARS DA VINCI ENDOWRIST (Cautery) ×2 IMPLANT
COVER STAND 54X23IN MAYO REG FABRIC REINFORCE STD (Drape) ×2 IMPLANT
DRAPE EQUIP DA VINCI COLUMN (Drape) ×2 IMPLANT
DRAPE EQUIP DA VINCI XI 21INX19INX10.5IN ARM (Drape) ×8 IMPLANT
DRAPE IOBAN 60CM X 45CM (Drape) ×2 IMPLANT
DRAPE PROXIMA 121INX102INX78IN ABSORB REINFORCEMENT FENESTRATE (Drape) ×2 IMPLANT
DRAPE UNDER BUTTOCK 44 1/2INX40IN PORT BACK SCREEN (Drape) ×4 IMPLANT
ELECTRODE ELECTROSURGICAL EXTENDED BLADE (Cautery) ×1 IMPLANT
ELECTRODE PATIENT RETURN POLYHESVIE II REM W/ CORD (Other) ×1 IMPLANT
ELECTRODE PLUMEPEN PRO 10FT 360D 3/8IN ADAPTER (Cautery) ×2 IMPLANT
ELECTRODE VALLEYLAB 6.5IN BLADE 3/32IN EXTEND (Cautery) ×1
GLOVE SURG BIOGEL UNDERGLOVE 6 INDICATOR PF (Glove) ×21 IMPLANT
GLOVE SURG NAT RUBBER 6 SIGNATURE TOUCH PF (Glove) ×21 IMPLANT
GOWN ULTRA REINFORCE SM GRN (Gown) ×4 IMPLANT
IRRIGATOR SUCTION STRYKEFLOW 2 STERILE LF DISP (Laparoscopic) ×2 IMPLANT
KIT ENDO CLEAN FIRST STEP KRINKLE SIMPLE2 200ML LF (Other) ×2 IMPLANT
LEGGINGS CUFF LF DISP 48X31IN 6IN SURG PROXIMA (Drape) ×2 IMPLANT
LUBRICANT PETROLEUM 2OZ WATER SOLUBLE STERILE (Other) ×2 IMPLANT
NEEDLE HYPODERMIC SAFETY 21GA 1-1/2IN MAGELLAN (Needle) ×2 IMPLANT
PACK BASIC REUSABLE (Towel) ×2 IMPLANT
PACK CUST MAJOR (Pack) ×2 IMPLANT
PACK GOWN 4 PACK W/TOWELS (Gown) ×6 IMPLANT
PAD GROUNDING W/9FT CORD POLYHESIVE II (Other) ×1
PAD POSITIONING PINK 40IN X 20IN X 1IN (Other) ×2 IMPLANT
PORT ACCESS 5MM 120MM AIRSEAL 2 WALL CANNULA (Blade) ×2 IMPLANT
RELOAD STAPLER GIA 80MMX3.8MM BLUE (Other) ×4 IMPLANT
SEAL ENDO INSTR 5-8MM DA VINCI XI UNIV SINGLE USE (Other) ×8 IMPLANT
SEALER TISSUE DA VINCI SLIM JAW EXTEND SINGLE USE (Cautery) ×2 IMPLANT
SEALER/DIVIDER LAPARO 18CM 13.5MM LIGASURE IMPACT (Other) ×2 IMPLANT
SET 82IN REGULATE CLAMP NONPYROGENIC IRRIG 10 (Tubing) ×2 IMPLANT
SET TUBE AIRSEAL 3LUM (Tubing) ×2 IMPLANT
SPONGE LAPAROTOMY 18X18IN HIGH ABSORB RADIOPQ (Sponge) ×4 IMPLANT
STAPLER REG TISSUE DST SERIES GIA TEARDROP 80MMX3.8MM 4ROW (Other) ×2 IMPLANT
STAPLER REG TISSUE DST SERIES TA 45MM 4.8MM LINEAR CUTTER (Other) ×2 IMPLANT
STAPLER SKIN 35 WIDE STAPLE CARTRIDGE LF APPOSE (Closure Device) ×2 IMPLANT
SUTURE BIOSYN 4-0 P-12 30IN UNDYED (Suture) ×4 IMPLANT
SUTURE MAXON 2-0 GS-21 30IN GRN (Suture) ×4 IMPLANT
SUTURE POLYSORB 0 GU-46 30IN VIOLET (Suture) ×4 IMPLANT
SUTURE POLYSORB 3-0 V-20 30IN UNDYED 115Q (Suture) ×4 IMPLANT
SUTURE SOFSILK 0 V-20 30IN (Suture) ×4 IMPLANT
SUTURE SOFSILK 4-0 V-20 18IN BLACK (Suture) ×4 IMPLANT
SYRINGE 30ML LL (Syringe) ×2 IMPLANT
SYRINGE BD 30ML SLIP TIP (Syringe) ×2 IMPLANT
SYS IMAGING 8X6IN CLEARIFY MICROFIBER WARM HUB (Other) ×2 IMPLANT
TISSUE PROTECT ALEXIS XL FLEX RETRACTION RING (Other) ×2 IMPLANT
TOWEL OR DISPOSABLE STERILE BLUE 6/PK (Towel) ×2 IMPLANT

## 2020-04-16 NOTE — H&P (Signed)
History and Physical     Olivia Marlowe Sax Rosenberry Velvet Bathe") - DOB: May 14, 1941 (79 year old female)  Preferred Pronouns: she/her/hers, patient's name  PCP: Meta Hatchet, MD   Code Status: No Order     CC: Cancer of ascending colon [facility])     SUBJECTIVE   Olivia Mcconnell is a 79 year old female with Colon Cancer. She was seen recently in clinic, where a detailed review of HPI can be found.  She was noted to benefit from COLECTOMY, RIGHT, ROBOT-ASSISTED - Right.       OBJECTIVE     Vitals (Arrival)      T: 37.2 C (04/16/20 0626)  BP: (not recorded)  HR: 67 (04/16/20 0626)  RR: 16 (04/16/20 0626)  SpO2: (not recorded)   Vitals (Most recent in last 24 hrs)   T: 37.2 C (04/16/20 0626)  BP: (not recorded)  HR: 67 (04/16/20 0626)  RR: 16 (04/16/20 0626)  SpO2: (not recorded)  T range: Temp  Min: 37.2 C  Max: 37.2 C  Wt 137 lb 12.6 oz (62.5 kg)     (no height taken for this visit)     Body mass index is 24.41 kg/m.       General:  Well developed, appearing stated age and in no acute distress  Mental Status:A&O x 3  Cardiovascular:normal exam and normal rate and rhythm  Lungs:   nl resp effort no rales/wheezes/rhonchi clear breath sounds  Abdomen: nl exam, non-tender, non-distended and no masses/organomegly    Labs (last 24 hours):   Chemistries  CBC  LFT  Gases, other   - - - 150   -   AST: - ALT: -  -/-/-/-  -/-/-/-   - - -   - >< -  AP: - T bili: -  Lact (a): - Lact (v): -   eGFR: - Ca: -   -   Prot: - Alb: -  Trop I: - D-dimer: -   Mg: - PO4: -  ANC: -     BNP: - Anti-Xa: -     ALC: -    INR: -         ASSESSMENT/PLAN   Olivia Mcconnell is a 79 year old female with Colon Cancer, who presents for COLECTOMY, RIGHT, ROBOT-ASSISTED - Right. Consent was obtained in pre-op holding area. R/B/A of the above procedure(s), were discussed and questions answered..  Peri-operative prophylactic antibiotics ordered. Antibiotics ordered include:metroNIDAZOLE - 500 MG  neomycin - 500 MG.     Alinda Sierras, MD

## 2020-04-16 NOTE — Anesthesia Procedure Notes (Signed)
Airway Placement    Staff:  Performing Provider: Clydell Hakim, CRNA  Authorizing Provider: Dayton Scrape, MD    Airway management:   Patient location: OR/Procedural area  Final airway type: endotracheal airway  Intubation reason: general anesthesia    Induction:  Positioning: supine  Patient was pre-oxygenated: yes  Mask Ventilation: Grade 1 - Ventilated by mask     Intubation:    Final Attempt   Airway Type: ETT  Primary Laryngoscopy: Clinton Gallant Size: 2  Laryngoscopic View: Grade I  ETT Type: standard, cuffed  ETT Route: oral  Size: 6.5  ETT secured with adhesive tape  Depth at: lips  (22cm)    Number of Attempts: 1    Assessment:  Confirmation: waveform capnography  Procedure Abandoned: no    Date / Time Airway Secured / Re-Secured:  04/16/2020 7:51 AM

## 2020-04-16 NOTE — Anesthesia Procedure Notes (Addendum)
Arterial Line Placement    Staff:  Performing provider: Clydell Hakim, CRNA  Authorizing provider: Dayton Scrape, MD    Procedure Indication/Location:  Patient location: OR/Procedural area  Indication: continuous blood pressure monitoring and blood sampling needed    Preparation:  Patient position: supine  Patient level of consciousness: under general anesthesia  Skin prep: chlorhexidine  Sterile barriers: sterile gloves    Procedure:  Laterality: right  Site: radial  Catheter size: 20 G  Catheter type: arrow  Guidewire used: yes  Ultrasound-guided: not applicable, ultrasound not used  Line secured: tape    Events:  Total attempts: 1  Complications: patient tolerated procedure well with no complications.    Date and Time Placed:  04/16/2020 7:58 AM

## 2020-04-16 NOTE — Nursing Note (Signed)
Illness Severity  .Stable  Admit from PACU @ 1500      Patient Summary  70F with pmhx of previous breast CA, now with colorectal CA s/p hemi colectomy 7/9.    Patient arrived to unit from PACU @ 1500. Drowsy but easy to rouse, Ox4. VSS, remains on 2L O2, denies pain but has PCA set up and scheduled tylenol if needed. Taking sips of water, no nausea or vomiting- is aware that she advance diet as tolerated. 4 lap sites to lower left abdomen- dermabond and OTA, no drainage. Patient was on insulin drip when arrived to floor, her blood glucose was within range x6 hours; team changed order to AC/HS with sliding scale and insulin drip was Dc'd. Foley in place- order to DC POD#. Son at bedside and assisting with care.

## 2020-04-16 NOTE — Anesthesia Procedure Notes (Signed)
PERIPHERAL IV    Staff:  Performing provider: Clydell Hakim, CRNA  Authorizing provider: Dayton Scrape, MD    Procedure Indication/Location:  Patient location: OR/Procedural area  Indication for IV: for care provided by anesthesia    Preparation:  Site prep: alcohol 70%  Skin local anesthetic used: local    Placement:  Laterality: left  Location: hand  Size: 20 G  Total attempts for all operators: 1    Date and Time Placed:   04/16/2020 7:52 AM

## 2020-04-16 NOTE — Procedure Nursing Note (Signed)
CALLED TO UPDATE HUSBAND

## 2020-04-16 NOTE — Anesthesia Procedure Notes (Signed)
PERIPHERAL IV    Staff:  Performing provider: Clydell Hakim, CRNA  Authorizing provider: Dayton Scrape, MD    Procedure Indication/Location:  Patient location: OR/Procedural area  Indication for IV: for care provided by anesthesia    Preparation:  Site prep: alcohol 70%    Placement:  Laterality: left  Location: hand  Size: 20 G  Total attempts for all operators: 1    Date and Time Placed:   04/16/2020 7:51 AM

## 2020-04-16 NOTE — Anesthesia Postprocedure Evaluation (Signed)
Patient: Maddalena Linarez Gossard    Procedure Summary     Date: 04/16/20 Room / Location: [facility] OR 03 / [facility] OR    Anesthesia Start: 0732 Anesthesia Stop: 1252    Procedure: COLECTOMY, RIGHT, ROBOT-ASSISTED (Right Abdomen) Diagnosis:       Cancer of ascending colon ([facility])      (Colon Cancer)    Surgeons: Ralph Leyden, MD Responsible Provider: Dayton Scrape, MD    Anesthesia Type: general ASA Status: 3        Final Anesthesia Type: general    Vitals Value Taken Time   BP 94/49 04/16/20 1430   Temp 36.4 C 04/16/20 1250   Pulse 69 04/16/20 1432   SpO2 100 % 04/16/20 1432   Vitals shown include unvalidated device data.    Place of evaluation: PACU    Patient participation: patient participated    Level of consciousness: fully conscious    Patient pain control satisfaction: patient is satisfied with level of pain control    Airway patency: patent    Cardiovascular status during assessment: stable    Respiratory status during assessment: breathing comfortably    Anesthetic complications: no    Intravascular volume status assessment: euvolemic    Nausea / vomiting: patient is not experiencing nausea      Planned post-operative disposition at time of assessment: ward care

## 2020-04-16 NOTE — Procedure Nursing Note (Signed)
Patient's son([name]) updated via phone.

## 2020-04-16 NOTE — Op Note (Signed)
Operative Note     Date: 04/16/2020  Location: [facility] OR    Name: Olivia Mcconnell, DOB: Apr 02, 1941, MRN: [id number]    Pre Procedure Diagnosis  Pre-op Diagnosis     * Cancer of ascending colon ([place]) [C18.2]    Post Procedure Diagnosis  Post-op Diagnosis     * Cancer of ascending colon ([facility]) [C18.2]    Procedure(s):  Robotic right hemicolectomy with primary stapled anastomosis    Surgeons      * Windell Norfolk, Venancio Poisson, MD - Primary, I was scrubbed and performed the entire procedure.     Sherian Maroon, MD - Assisting    Procedure Summary  Anesthesia: GET  ASA: III  Estimated Blood Loss: 46m    Wound Class: Clean contaminated  Drains:   Urethral Catheter 04/16/20 (Active)   Site Assessment Clean;Skin intact;Pink 04/16/20 02202  Collection Container Standard drainage bag;No dependent loops 04/16/20 0922   Securement Method Right thigh 04/16/20 05427          Indications: NKeva Dartyis an 79year old female with a recently diagnosed ascending colon adenocarcinoma.  Staging CT demonstrates possible local lymph node involvement but no definitive distant metastasis.  The decision was therefore made to proceed with surgical resection.  The risks of the procedure including but not limited to bleeding, infection, injury to adjacent structures, and anastomotic leak were discussed with the patient and she agreed to proceed.  A modifier 22 should be added to the case - the complexity due to adhesions and the nature of the tumor increased the duration of the procedure by 50%.     Findings: Large cecal mass with enlarged ileocolic lymph nodes which were resected with specimen.  No obvious distant metastatic disease.  Gross margins confirmed negative by pathology.       Procedure Details:   The patient was taken to the operating room in stable condition and the pre-procedure safety checklist was performed.  Sequential compression devices were placed on the lower extremities bilaterally and the patient was  administered subcutaneous heparin and antibiotics.  After induction of endotracheal anesthesia, the patient was placed in the modified lithotomy position ensuring that all pressure points were properly padded.  A Foley catheter was placed under sterile technique.  The patient was prepped and draped in standard sterile fashion and the pre-incision checklist completed.  We began by placing the periumbilical port.  An incision was made sharply in the skin and carried down through the subcutaneous tissue until the fascia was visualized.  The fascia was cleared off of overlying fatty tissue and a pursestring suture was placed.  The fascia was sharply incised vertically and the rectus muscle retracted.  The peritoneum was incised allowing entry into the abdomen under direct visualization.  The 887mrobotic trocar was placed and cinched into place with the pursestring suture.  Pneumoperitoneum was achieved and maintained at 1574m.  The camera was inserted and after ensuring no injury upon entry and that I would be able to proceed robotically, 3 additional 8mm55mbotic ports and a 5mm 62mist port were placed.      The patient was placed in the Trendelenburg position and exploration of the abdomen was performed with no evidence of distant disease.  Adhesions of the small bowel and omentum to the abdominal wall were carefully lysed.  One loop of small bowel was adhesed to the cecum at the site of the tumor which was left intact in order to perform  an en bloc resection. The omentum was lifted above the transverse colon and the bowel was inspected with identification of the tattoo and lesion.  The patient was placed with the left side down and the cecum was grasped and retracted into the pelvis allowing visualizaiton of the ileocolic pedicle.  An incision was made in the peritoneum and a mesenteric window was created.  The duodenum was identified and swept into the retroperitoneum.  Ensuring no injury to the duodenum, a high  ligation of the ileocolic pedicle was performed with the vessel sealer including the two enlarged ileocolic nodes which were visualized.  Hemostasis of the proximal and distal stumps was confirmed.  I then continued with the medial to lateral mobilization of the hepatic flexure.  I then turned my attention to lateral mobilization.  The lateral peritoneal reflection was idenfied and incised with sharp dissection ensuring that I did not enter the retroperitoneum and the plane of medial mobilization was met.  The right ureter was visualized and I ensured it was out of the field of dissection.  I then continued dissection around the hepatic flexure ensuring no injury to the duodenum or gallbladder.  The terminal ileal mesentery was mobilized to ensure that I would be able to extract the specimen under no tension.      At this point the extraction site was made incorporating the periumbilical port site. All port were taken out under direct vision.  A wound protector was placed and the mobilized bowel exteriorized.  I noted that the adhesed loop of bowel was about 10cm proximal to the terminal ileum and therefore this portion could be incorporated with the proximal transection.  A mesenteric window was created and the bowel transected with a GIA 24m blue load stapler proximal to the possible involved small bowel.  The intervening mesentery was ligated and divided with a Ligasure device.  The site of distal transection was in an area of well vascularized and healthy transverse colon ensuring an adequate distal margin.  The bowel was transected with a reload of the GIA 820mblue load stapler and the intervening mesentery ligated and divided.  The specimen was taken out of the abdominal cavity and sent to pathology for permanent section.  I confirmed negative gross margins with pathology.    I then turned my attention to the anastomosis.  Adequate blood supply of the proximal and distal staple lines was confirmed with ICG.   After ensuring no twisting of the bowel or mesentery a side to side functional end to end anastomosis was created with a reload of the GIA stapler.  The enterotomy/colotomy was closed with at TA 4532mlue load stapler.  All junctional and intervening staple lines were reinforced with 4-0 silk suture.The anastomosis appeared healthy, patent, hemostatic, and intact.    I then turned my attention to closure of the abdomen.  The wound was swept with no evidence of a retained object and an initial count completed.  Exparel was injected along the wound and the fascia of the extraction site was closed with a running 2-0 Maxon suture.  After irrigating the subcutaneous tissue interrupted 3-0 polysorb sutures were placed.   The port sites and skin were closed with 4-0 biosyn and Dermabond.  A final safety check was performed and the instrument, sponge, and needle count were correct at the end of the case per the nursing team.  The patient tolerated the procedure well and was taken to the recovery room in stable condition. I discussed  the findings with the patient's son and answered all questions to his satisfaction.      Complications: None.     Condition: stable

## 2020-04-16 NOTE — Procedure Nursing Note (Signed)
Pt arrived to PACU very sleepy  Arouses to voice  Simple mask 10L: transitioned to 3L NC    VSS: BP softer at baseline    Insulin gtt continued  PCA pump set up  Updated pt son, he will visit pt on floor today    Unable to find pt belongings, staff continuing to search.     Anesthesia signed out  PACU criteria met  Report given to [place], RN 4NE    Aundria Mems, RN

## 2020-04-16 NOTE — Procedure Nursing Note (Signed)
Son has pt belongings.  Aundria Mems, RN

## 2020-04-16 NOTE — Progress Notes (Signed)
GENERAL SURGERY POST OP NOTE        Progress Note     [name]") - DOB: 11/30/1940 (79 year old female)  Preferred Pronouns: she/her/hers, patient's name  Admit Date: 04/16/2020  Code Status: No Order     CHIEF CONCERN / IDENTIFICATION:  Olivia Mcconnell is a 79 year old female with colon adenocarcinoma who underwent a robot-assisted right hemicolectomy on 04/16/20 with Dr. Windell Norfolk.      SUBJECTIVE     Surgical/Procedural Cases on this Admission     Case IDs Date Procedure Surgeon Location Status    [id number] 04/16/20 COLECTOMY, RIGHT, ROBOT-ASSISTED Krane, Venancio Poisson, MD [facility] MAIN OR [facility]          INTERVAL HISTORY:  Arrived on floor stable from PACU. Initially on insulin drip when arrived to floor- sugars within 130s-170s, discontinued and transitioned to SSI. Here with her son helping attend to Olivia Mcconnell's needs.     POST-OP CHECK:  - Sleepy in bed, able to be aroused to voice and open eyes and hold a conversation   - Denies nausea/vomiting  -Complaining of sore throat, sips of clears helping   - Foley in place   - States pain is well controlled- not using PCA (pt informed how to use it and located within reach)  -Tolerating clears well   - Denies chest pain, shortness of breath.    SCHEDULED MEDICATIONS:   .  acetaminophen, 650 mg, q4h [place]  .  albuterol, 2.5 mg, Once  .  gabapentin, 300 mg, TID  .  naloxegol, 25 mg, Daily empty stomach    INFUSED MEDICATIONS:  HYDROmorphone 1 mg/mL PCA, , PCA **AND** HYDROmorphone PCA CLINICIAN, 0.2 mg, PRN  .  insulin REGULAR, 0-19 Units/hr, Continuous, Last Rate: 2.5 Units/hr (04/16/20 1342)  .  lactated ringers, 100 mL/hr, Continuous, Last Rate: 100 mL/hr (04/16/20 1301)     PRN MEDICATIONS:  atropine, 0.5 mg, q2 min PRN  .  dextrose, 25 mL, PRN  .  dextrose, 50 mL, PRN  .  fentaNYL PF, 12.5-50 mcg, q5 min PRN  .  glucagon, 0.5 mg, PRN  .  glucagon, 1 mg, PRN  .  HYDROmorphone 1 mg/mL PCA, , PCA **AND** HYDROmorphone PCA CLINICIAN, 0.2 mg, PRN  .   HYDROmorphone, 0.2-0.4 mg, q5 min PRN  .  labetalol, 2.5 mg, q5 min PRN  .  naloxone, 0.04 mg, q2 min PRN  .  naloxone, 0.08 mg, q2 min PRN  .  ondansetron, 4 mg, q10 min PRN       OBJECTIVE     Vitals (Most recent in last 24 hrs)     T: 36.4 C (04/16/20 1250)  BP: 100/55 (04/16/20 1330)  HR: 73 (04/16/20 1330)  RR: 16 (04/16/20 1330)  SpO2: 98 % (04/16/20 1330)  T range: Temp  Min: 36.4 C  Max: 37.2 C  Admit weight: 62.5 kg (137 lb 12.6 oz) (04/16/20 0622)  Last weight: 62.5 kg (137 lb 12.6 oz) (04/16/20 0622)       I&Os:     Intake/Output Summary (Last 24 hours) at 04/16/2020 1356  Last data filed at 04/16/2020 1300  Intake 1200 ml   Output 550 ml   Net 650 ml       PHYSICAL EXAM  Physical Exam  Constitutional: Well developed and well nourished. Sleepy, able to be arouse to voice and will open eyes to converse.   HEENT: Normocephalic, atraumatic, sclera anicteric, no ocular discharge,  EOMI bilaterally, trachea midline  CV: Rate and rhythm regular to palpation. Extremities are warm and well perfused  Pulm: Unlabored breathing on 2L NC   Abdomen: Non-distended, soft, mildly-tender. Dermabond over incisions- 3x lap port sites with 1x vertical midline incision at umbilicus- small ecchymosis at end   Neuro: Alert and oriented. Motor and sensory grossly intact.  MSK: Bulk and tone appropriate. No edema in extremities.   Skin: No obvious rashes or lesions.   Psych: Mood and affect appropriate and congruent      Labs (last 24 hours):   Chemistries  CBC  LFT  Gases, other   - - - 172   7.7   AST: - ALT: -  -/-/-/-  -/-/-/-   - - -   8.78 >< 427  AP: - T bili: -  Lact (a): - Lact (v): -   eGFR: - Ca: -   27   Prot: - Alb: -  Trop I: - D-dimer: -   Mg: - PO4: -  ANC: -     BNP: - Anti-Xa: -     ALC: -    INR: -        ASSESSMENT/PLAN   Olivia Mcconnell is a 79 year old female with colon adenocarcinoma who underwent a robot-assisted right hemicolectomy on 04/16/20 with Dr. Windell Norfolk. She is continuing to recover well  post-operatively and we plan to proceed as follows:    #Colon adenocarcinoma- s/p right hemicolectomy   -ERAS pathway   -Low res diet today  -Foley to come out tomorrow (04/17/20)    #Hyperglycemia  -Insulin gtt discontinued   -Sugars well controlled 120s-170s  -SSI    #Normal Pressure Hydrocephalus  -Incontinent of urine at baseline    #Asymptomatic UTI  -UA with E.Coli on 04/15/20, 2x doses of keflex preop  -WBC 8.78    #Hx of thyroid cancer s/p thyroidectomy   -Levothyroxine 163mg daily     #GERD  #Glaucoma    JElecta Sniff MD  General Surgery PGY-1  Pager #: [phone]

## 2020-04-17 LAB — COMPREHENSIVE METABOLIC PANEL
ALT (GPT): 5 U/L — ABNORMAL LOW (ref 7–33)
AST (GOT): 9 U/L (ref 9–38)
Albumin: 2.9 g/dL — ABNORMAL LOW (ref 3.5–5.2)
Alkaline Phosphatase (Total): 47 U/L — ABNORMAL LOW (ref 49–199)
Anion Gap: 9 (ref 4–12)
Bilirubin (Total): 0.2 mg/dL (ref 0.2–1.3)
Calcium: 8.3 mg/dL — ABNORMAL LOW (ref 8.9–10.2)
Carbon Dioxide, Total: 24 meq/L (ref 22–32)
Chloride: 106 meq/L (ref 98–108)
Creatinine: 0.49 mg/dL (ref 0.38–1.02)
Glucose: 148 mg/dL — ABNORMAL HIGH (ref 62–125)
Potassium: 3.5 meq/L — ABNORMAL LOW (ref 3.6–5.2)
Protein (Total): 5 g/dL — ABNORMAL LOW (ref 6.0–8.2)
Sodium: 139 meq/L (ref 135–145)
Urea Nitrogen: 9 mg/dL (ref 8–21)
eGFR by CKD-EPI: >60 mL/min/{1.73_m2} (ref >59)

## 2020-04-17 LAB — TYPE AND SCREEN
ABO/Rh: O POS
Antibody Screen: NEGATIVE
Unit Division: 0
Unit Division: 0
Units Ordered: 2

## 2020-04-17 LAB — MAGNESIUM: Magnesium: 1.9 mg/dL (ref 1.8–2.4)

## 2020-04-17 LAB — CALCIUM, (REFLEXIVE IONIZED)

## 2020-04-17 LAB — CBC (HEMOGRAM)
Hematocrit: 25 % — ABNORMAL LOW (ref 36–45)
Hemoglobin: 7.2 g/dL — ABNORMAL LOW (ref 11.5–15.5)
MCH: 18.8 pg — ABNORMAL LOW (ref 27.3–33.6)
MCHC: 28.5 g/dL — ABNORMAL LOW (ref 32.2–36.5)
MCV: 66 fL — ABNORMAL LOW (ref 81–98)
Platelet Count: 395 10*3/uL (ref 150–400)
RBC: 3.84 10*6/uL (ref 3.80–5.00)
RDW-CV: 18.4 % — ABNORMAL HIGH (ref 11.6–14.4)
WBC: 11.15 10*3/uL — ABNORMAL HIGH (ref 4.3–10.0)

## 2020-04-17 LAB — GLUCOSE POC, ~~LOC~~
Glucose (POC): 175 mg/dL — ABNORMAL HIGH (ref 62–125)
Glucose (POC): 212 mg/dL — ABNORMAL HIGH (ref 62–125)
Glucose (POC): 168 mg/dL — ABNORMAL HIGH (ref 62–125)
Glucose (POC): 143 mg/dL — ABNORMAL HIGH (ref 62–125)

## 2020-04-17 LAB — PHOSPHATE: Phosphate: 3.3 mg/dL (ref 2.5–4.5)

## 2020-04-17 LAB — IRON, SERUM: Iron, SRM: <10 ug/dL — ABNORMAL LOW (ref 31–171)

## 2020-04-17 MED ORDER — LEVOTHYROXINE SODIUM 137 MCG OR TABS
137.0000 ug | ORAL_TABLET | Freq: Every day | ORAL | Status: DC
Start: 2020-04-17 — End: 2020-04-20
  Administered 2020-04-17: 137 ug via ORAL
  Filled 2020-04-17 (×2): qty 1

## 2020-04-17 MED ORDER — DEXTROSE IN LACTATED RINGERS 5 % IV SOLN
INTRAVENOUS | Status: AC
Start: 2020-04-17 — End: 2020-04-18
  Filled 2020-04-17: qty 1000

## 2020-04-17 MED ORDER — SODIUM CHLORIDE 0.9 % IV SOLN
INTRAVENOUS | Status: AC
Start: 2020-04-17 — End: 2020-04-17
  Administered 2020-04-17: 250 mL via URETHRAL
  Filled 2020-04-17: qty 500

## 2020-04-17 MED ORDER — OXYCODONE HCL 5 MG OR TABS
5.0000 mg | ORAL_TABLET | ORAL | Status: DC | PRN
Start: 2020-04-17 — End: 2020-04-20
  Administered 2020-04-18 – 2020-04-20 (×2): 5 mg via ORAL
  Filled 2020-04-17 (×2): qty 1

## 2020-04-17 MED ORDER — CEPHALEXIN 250 MG OR CAPS
250.0000 mg | ORAL_CAPSULE | Freq: Three times a day (TID) | ORAL | Status: DC
Start: 2020-04-22 — End: 2020-04-20

## 2020-04-17 NOTE — Nursing Note (Signed)
Patient Summary  20F with pmhx of previous breast CA, now with colorectal CA s/p hemi colectomy 7/9.    Drowsy, but arousable. A/Ox4, follows commands appropriately. VSS on 2L O2. Reports minimal to no pain, uses PCA appropriately. LFD, ACHS. . Denies N/V. (-) flatus. Foley in place, draining adequately.   Edited by: Roosvelt Harps, RN at 04/17/2020 0202    Illness Severity  Stable    Edited by: Roosvelt Harps, RN at 04/17/2020 [phone]

## 2020-04-17 NOTE — Nursing Note (Signed)
Patient Summary  60F with pmhx of previous breast CA, now with colorectal CA s/p hemi colectomy 7/9.    Drowsy, but arousable. A/Ox4, follows commands appropriately. VSS on 2L O2. Reports minimal to no pain, uses PCA appropriately. LFD, ACHS. . Denies N/V. (-) flatus. Foley in place, draining adequately.   Edited by: Roosvelt Harps, RN at 04/17/2020 0202    Illness Severity  Stable    Edited by: Roosvelt Harps, RN at 04/17/2020 [phone]

## 2020-04-17 NOTE — Progress Notes (Signed)
Progress Note     Olivia Marlowe Sax Mickle Velvet Bathe") - DOB: 1941-04-07 (79 year old female)  Preferred Pronouns: she/her/hers, patient's name  Admit Date: 04/16/2020  Code Status: Full Code     CHIEF CONCERN / IDENTIFICATION:  Olivia Mcconnell is a 79 year old female with colon adenocarcinoma who underwent a robot-assisted right hemicolectomy on 04/16/20 with Dr. Windell Norfolk.      SUBJECTIVE     Surgical/Procedural Cases on this Admission     Case IDs Date Procedure Surgeon Location Status    [id number] 04/16/20 COLECTOMY, RIGHT, ROBOT-ASSISTED Krane, Venancio Poisson, MD [name] MAIN OR [facility]          INTERVAL HISTORY:  -Comfortable, dozing on and off in bed this morning, arousable to voice   -Had a bite of grilled cheese, ice cream and drinking well   -Hungry for breakfast this am   -Minimal right sided abdominal tenderness     SCHEDULED MEDICATIONS:   .  acetaminophen, 1,000 mg, QID  .  alvimopan, 12 mg, BID  .  gabapentin, 300 mg, TID  .  heparin, 5,000 Units, q8h [facility]  .  insulin LISPRO, 0-4 Units, q HS  .  insulin LISPRO, 0-5 Units, TID before meals    INFUSED MEDICATIONS:  dextrose 5 % and lactated ringers, 1 mL/kg/hr (Dosing Weight), Continuous, Last Rate: 1 mL/kg/hr (04/16/20 1542)  .  HYDROmorphone 1 mg/mL PCA, , PCA **AND** HYDROmorphone PCA CLINICIAN, 0.2 mg, PRN     PRN MEDICATIONS:  dextrose, 25 mL, PRN  .  dextrose, 50 mL, PRN  .  glucagon, 0.5 mg, PRN  .  glucagon, 1 mg, PRN  .  HYDROmorphone 1 mg/mL PCA, , PCA **AND** HYDROmorphone PCA CLINICIAN, 0.2 mg, PRN  .  naloxone, 0.08 mg, q2 min PRN       OBJECTIVE     Vitals (Most recent in last 24 hrs)     T: 36.5 C (04/17/20 0002)  BP: 95/47 (04/17/20 0002)  HR: 64 (04/17/20 0002)  RR: 16 (04/17/20 0002)  SpO2: 95 % (04/17/20 0002)  T range: Temp  Min: 36 C  Max: 37.2 C  Admit weight: 62.5 kg (137 lb 12.6 oz) (04/16/20 0622)  Last weight: 62.5 kg (137 lb 12.6 oz) (04/16/20 0622)       I&Os:     Intake/Output Summary (Last 24 hours) at 04/17/2020 0521  Last data filed at  04/16/2020 2100  Intake 1200 ml   Output 960 ml   Net 240 ml       Physical Exam  Constitutional: Well developed and well nourished. Sleepy, able to be arouse to voice   HEENT: Normocephalic, atraumatic, sclera anicteric, no ocular discharge, EOMI bilaterally, trachea midline  CV: Rate and rhythm regular to palpation. Extremities are warm and well perfused  Pulm: Unlabored breathing on room air  Abdomen: Non-distended, soft, mildly-tender. Dermabond over incisions- 3x lap port sites with 1x vertical midline incision at umbilicus- small area of erythema around superior pole of incision with 1cm ecchymosis at inferior edge   GU: Foley in place    Neuro: Alert and oriented. Motor and sensory grossly intact.  MSK: Bulk and tone appropriate. No edema in extremities.   Skin: No obvious rashes or lesions.   Psych: Mood and affect appropriate and congruent    Labs (last 24 hours):   Chemistries  CBC  LFT  Gases, other   - - - 234   7.7   AST: - ALT: -  -/-/-/-  -/-/-/-   - - -  8.78 >< 427  AP: - T bili: -  Lact (a): - Lact (v): -   eGFR: - Ca: -   27   Prot: - Alb: -  Trop I: - D-dimer: -   Mg: - PO4: -  ANC: -     BNP: - Anti-Xa: -     ALC: -    INR: -         ASSESSMENT/PLAN   Olivia Mcconnell is a 79 year old female with colon adenocarcinoma who underwent a robot-assisted right hemicolectomy on 04/16/20 with Dr. Windell Norfolk. She is continuing to recover well post-operatively and we plan to proceed as follows:    #Colon adenocarcinoma- s/p right hemicolectomy   -ERAS pathway   -Low res diet today  -Fill and pull foley removal   -F/u DTV @ 1400 '[ ]'$    -To work with pt/ot  -Lovenox teaching prior to discharge, consult placed   -Pain control with PCA   -if tolerating low res diet can transition to oral pain meds today     #Hyperglycemia  -Insulin gtt discontinued   -Sugars well controlled 120s-170s  -SSI    #Normal Pressure Hydrocephalus  -Incontinent of urine at baseline    #Asymptomatic UTI  -UA with E.Coli on 04/15/20, 2x  doses of keflex preop  -WBC 8.78  -Will continue Keflex for a total of 7 days (EOT: 04/22/20)    #Hx of thyroid cancer s/p thyroidectomy   -Levothyroxine 165mg daily     #Chronic Symptomatic Anemia  -H/H 7.2/25  -Serum iron <10     #GERD  #Glaucoma    JElecta Sniff MD  General Surgery PGY-1  Pager #: [phone]

## 2020-04-17 NOTE — Nursing Note (Signed)
Patient Summary  85F with pmhx of previous breast CA, now with colorectal CA s/p hemi colectomy 7/9.    A/Ox4, some STMI noted.  Pt reports fatigue, but arouses easily.  Pain well controlled, Experel in the case, scheduled Tylenol, declined 1400 dose of gabapentin d/t lack of pain.  Transitioned off PCA.  No PO Oxy yet.  VSS on RA most of the day.  In the afternoon, needed 1-2L NC while napping.  Sats improved with pulmonary hygiene.  Tolerating LFD, SSI needed.  +BT, +Flatus, scant, loose stool noted.  Voiding trial done, pt has hx of "constant dribbling" wearing Attends.  Continent void x3, PVRs of 110mL.  Up w/1PA and home 4WW.  Worked w/PT/OT, ambulated x 2 and sat in chair.  Lovenox teaching scheduled for Sunday.  Two of pt's son's visited, providing supportive care.    Edited by: Royce Macadamia, RN at 04/17/2020 1847    Illness Severity  Stable    Edited by: Royce Macadamia, RN at 04/17/2020 [phone]

## 2020-04-17 NOTE — Progress Notes (Signed)
Occupational Therapy Note      Patient Name: Olivia Mcconnell  MRN: [id number]  Today's Date: 04/17/2020  Treatment Duration (min): 39 Minutes    ADL and Functional Transfer Status reflective of last 24 hours  Eating: Set up or clean up assistance     Grooming: Set up or clean up assistance;Position- sitting     Oral Hygiene: Set up or clean up assistance(sitting)     Upper Body Dressing: Moderate Assist (between 25-49% help needed)           Putting on/taking off footwear: Maximum Assist (between 50-74% help needed)(Edu figure-4, unable to do today, will benefit from [facility] equip)                 Rolling Left: Supervision / Stand by assistance;Railings used        Sit>Supine: Minimum Assist (less than 25% help needed);Cues needed;Railings used(cues log rolling, HOB 30deg (Has adjustable bed at home))           Transfers  Sit<>Stand: Minimum Assist (less than 25% help needed)  Cuing Used: Verbal(hand placement, each time)  Sit<>Stand Assistive Device Details: Four wheeled walker  Sit <> Stand Comments: Stood up for ~12mins for orthostatics on first attempt, initially w/ posterior lean requiring min A and cueing to correct. Second sit>stand, pt stood for a minute and initiated a couple steps but felt she needed to sit again due to weakness. For stand>sit to chair, pt needs cues to take walker with her, not to rush, controlled descent    See flowsheet for objective data reflective of today's session.    Assessment  Impact on Function and Participation: OT eval, first time up since surgery, made good progress. At baseline she lives in [place]), &  is indep with mobility & ADLs. Today she was educated on abd precautions after which she got out of bed, performed sit>stand, and ambulated ~80ft in the room with min A and 4ww. She did have posterior leaning upon standing, and had festinating gait pattern. As expected she needs max A for most ADLs at this time. Pt will likely need more CG support at  discharge, and she is open to going to Assisted Living portion of [place].     Barriers to Discharge: Decreased caregiver support  Evaluation/Treatment Tolerance: Patient tolerated treatment well  OT Summary for Next Shift: LB dressing w/ LH equip, toilet transfer, shower safety within prec's    Discharge Recommendations  OT Discharge Recommendations: Follow up with OT at next level of care, Bath aide?  Assistance with Details: ADLs, IADLs, Functional mobility and transfers            Goals   Goal: Toileting, Toilet transfer, Supervision / Stand by assistance, Adaptive equipment  Estimated Completion Date: 04/22/20    Goal: Lower body dressing, Supervision / Stand by assistance, Adaptive equipment  Estimated Completion Date: 04/22/20    Goal: Bathing, Minimum Assist (less than 25% help needed), Adaptive equipment  Estimated Completion Date: 04/22/20                  Gar Ponto, OT  04/17/2020

## 2020-04-17 NOTE — Progress Notes (Signed)
Physical Therapy Evaluation    Olivia Mcconnell  [id number]     04/17/2020  Treatment Duration (min): 55 Mins    Patient Active Problem List   Diagnosis   . Papillary thyroid carcinoma ([place])   . Osteoporosis   . Essential hypertension   . Dyslipidemia   . Postoperative hypothyroidism   . Acute cystitis without hematuria   . 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 Thyroid cancer ([place]) 2011   . Breast mass 1990    Removed   . Anemia     [place]   . Colon cancer [facility])        Past Surgical History:   Procedure Laterality Date   . COLONOSCOPY  04/01/2020   . PR APPENDECTOMY  1990s   . PR UNLISTED PROCEDURE BREAST Right 1990   . THYROID SURGERY  2011    total thyroidectomy   . TOTAL KNEE ARTHROPLASTY Left 09/2014       Home Living  Home Living  Type of Home: Assisted living facility(Independent living at [facility])  Indoor Stairs: Media planner  Lives With: [place]: Four wheeled walker or rollator    Prior Level of Function  Prior Function  Prior Level of Function: Independent with all functional mobility and ADLs using assistive device  Details on Prior Level of Function: Furnature surfs in apartment, uses 4WW outside of apartment  Type of Occupation: Retired  Administrator Available at Home: Intermittent assistance    Extremity Assessments  RUE Assessment  RUE Assessment: Within Functional Limits              LUE Assessment  LUE Assessment: Within Functional Limits              RLE Assessment  RLE Assessment: Within Functional Limits              LLE Assessment  LLE Assessment: Within Functional Limits                Sensation     Proprioception     Coordination     Perception     Vision/Vestibular     Anthropometric Measurements     Static Sitting Balance  Static Sitting Balance  Static Sitting-Balance Support: No upper extremity supported;Feet supported;Trunk supported  Static Sitting Level of Assistance: Supervision / Stand by  assistance  Static Sitting Balance Comment: In bedside chair  Dynamic Sitting Balance     Static Standing Balance  Static Standing Balance  Static Standing Balance: Bilateral upper extremity supported  Static Standing Balance Level of Assistance: Contact guard assist (touching/steadying assistance)  Static Standing Balance Comment: with [id number]  Dynamic Standing Balance     Activity Tolerance  Activity Tolerance  Endurance: Tolerates 10 - 20 min of activity with multiple rests    Functional Level:  Bed Mobility:      Transfers:   Sit<>Stand: Contact guard assist (touching/steadying assistance);Assistive device used  Bed<>Chair/WC: Contact guard assist (touching/steadying assistance);Assistive device used   Ambulation:   Distance: 32ft, ~5 standing rest breaks  Assistance Needs: Contact guard assist (touching/steadying assistance)  Assistive Device Used: Four wheeled walker  Stairs:      Wheelchair:       Previous Interventions (see Flowsheets for data reflective of daily treatment interventions):  Therex:  Theract:   Sit<>stand from bedside chair with SBA, pt only able to stand using BUEs to push off wheelchair armrests     39ft with CGA using 4WW, ~5 standing rest breaks, kyphotic posture, able to improve with cueing, short shuffling steps, difficulty with initiation of gait     Discussion with pt and her son regarding progressive walking proram and post-op precautions              Balance/neuro re-ed:                              Gait Training:                               Other:                                      ASSESSMENT/PLAN     Pt was able to ambulate 328ft today with multiple standing rest breaks and hands on assist using her four wheeled walker. She lives in independent living at [facility] at baseline, and uses a four wheeled walker to ambulate. She appears to have strong family support from her children. PT will continue to follow.     Barriers to Discharge: Intermittent  assist at ALF           Equipment Recommended: 4 wheeled walker     Goals:  Goal: Bed mobility, Independent  Estimated Completion Date: 04/23/20  Goal Status: Progressing   Goal: Transfer with AD, Independent  Estimated Completion Date: 04/23/20  Goal Status: Progressing   Goal: Amb with AD, Independent  Estimated Completion Date: 04/23/20  Goal Status: Progressing  Goal: AMPAC >22  Estimated Completion Date: 04/23/20  Goal Status: Progressing       PT Plan of Care:  Treatment/Interventions: Therapeutic exercise, Therapeutic functional activities, Gait/Stairs training   PT Plan: Skilled PT   PT Dosage: 3-5x/week   Plan for Next Session: Progress amb with 4WW, bed mobility    Other Recommendations:  PT Discharge Recommendations: Follow up with PT at next level of care (to be determined by primary team)   PT Daily Recommendations: 1PA with 4WW, amb 3-4 x/day    Bradd Burner Threlkeld-Guy, PT

## 2020-04-18 ENCOUNTER — Other Ambulatory Visit: Payer: Self-pay

## 2020-04-18 LAB — LAB ADD ON ORDER

## 2020-04-18 LAB — CBC (HEMOGRAM)
Hematocrit: 32 % — ABNORMAL LOW (ref 36–45)
Hemoglobin: 8.8 g/dL — ABNORMAL LOW (ref 11.5–15.5)
MCH: 18.4 pg — ABNORMAL LOW (ref 27.3–33.6)
MCHC: 27.2 g/dL — ABNORMAL LOW (ref 32.2–36.5)
MCV: 68 fL — ABNORMAL LOW (ref 81–98)
Platelet Count: 499 10*3/uL — ABNORMAL HIGH (ref 150–400)
RBC: 4.77 10*6/uL (ref 3.80–5.00)
RDW-CV: 18.6 % — ABNORMAL HIGH (ref 11.6–14.4)
WBC: 10.29 10*3/uL — ABNORMAL HIGH (ref 4.3–10.0)

## 2020-04-18 LAB — T4, FREE: Thyroxine (Free): 2 ng/dL — ABNORMAL HIGH (ref 0.6–1.2)

## 2020-04-18 LAB — BASIC METABOLIC PANEL
Anion Gap: 8 (ref 4–12)
Calcium: 8.7 mg/dL — ABNORMAL LOW (ref 8.9–10.2)
Carbon Dioxide, Total: 26 meq/L (ref 22–32)
Chloride: 107 meq/L (ref 98–108)
Creatinine: 0.48 mg/dL (ref 0.38–1.02)
Glucose: 136 mg/dL — ABNORMAL HIGH (ref 62–125)
Potassium: 3.8 meq/L (ref 3.6–5.2)
Sodium: 141 meq/L (ref 135–145)
Urea Nitrogen: 6 mg/dL — ABNORMAL LOW (ref 8–21)
eGFR by CKD-EPI: >60 mL/min/{1.73_m2} (ref >59)

## 2020-04-18 LAB — GLUCOSE POC, ~~LOC~~
Glucose (POC): 111 mg/dL (ref 62–125)
Glucose (POC): 176 mg/dL — ABNORMAL HIGH (ref 62–125)
Glucose (POC): 107 mg/dL (ref 62–125)

## 2020-04-18 MED ORDER — SODIUM CHLORIDE 0.9 % IV SOLN
INTRAVENOUS | Status: AC
Start: 2020-04-18 — End: 2020-04-18
  Filled 2020-04-18: qty 500

## 2020-04-18 MED ORDER — POTASSIUM CHLORIDE CRYS ER 20 MEQ OR TBCR
40.0000 meq | EXTENDED_RELEASE_TABLET | Freq: Once | ORAL | Status: AC
Start: 2020-04-18 — End: 2020-04-18
  Administered 2020-04-18: 40 meq via ORAL
  Filled 2020-04-18: qty 2

## 2020-04-18 MED ORDER — SODIUM CHLORIDE 0.9 % IV SOLN
125.0000 mg | Freq: Every day | INTRAVENOUS | Status: DC
Start: 2020-04-18 — End: 2020-04-20
  Administered 2020-04-18 – 2020-04-20 (×3): 125 mg via INTRAVENOUS
  Filled 2020-04-18 (×3): qty 10

## 2020-04-18 MED ORDER — MAGNESIUM SULFATE 4 GM/100ML SWFI IV SOLN
4.0000 g | Freq: Once | INTRAVENOUS | Status: AC
Start: 2020-04-18 — End: 2020-04-18
  Administered 2020-04-18: 4 g via INTRAVENOUS
  Filled 2020-04-18: qty 100

## 2020-04-18 NOTE — Progress Notes (Signed)
Anticoagulation Education - Low Molecular Weight Heparin    Primary learner(s): Patient    Interpreter was present: No    Education materials discussed and provided: Yes, [facility] handout, [facility] kit.    LMWH Discussion Topics Included:    Rationale for LMWH therapy   Basic explanation of how LMWH therapy works   When to administer doses/importance of compliance   What to do about missed or late doses   Signs and symptoms of bleeding   Signs and symptoms of venous/pulmonary thromboembolism   Patient able to demonstrate proper injection technique   Caregiver able to demonstrate proper injection technique   How to properly store/dispose syringes   Possible complications related to SQ injection   Estimated length of therapy with LMWH   Follow-up monitoring    LMWH information was taught by:    Patient demonstration/self-injection   Providing verbal information   Providing written information   Using audio/visual teaching tools    Post-education response: States understanding    Olivia Mcconnell S Olivia Mcconnell, PharmD

## 2020-04-18 NOTE — Progress Notes (Signed)
Occupational Therapy Note      Patient Name: Olivia Mcconnell  MRN: [id number]  Today's Date: 04/18/2020  Treatment Duration (min): 30 Minutes    ADL and Functional Transfer Status reflective of last 24 hours                                Putting on/taking off footwear: Supervision / Stand by assistance  Putting on/taking off footwear Comment: Pt able to bring B Les into figure 4 sit this date. Pt reports having long handled shoe horn, declined need for other long handled equipment           Bathing Comments: Pt reports having shower chair at home and grab bars around shower, son reporting pt has but uses shower chair as a shampoo shelf. Education on fall prevention and using shower temporarily until pt hack to functional baseline. Pt reporting she did not need to sit for bathing tasks before, but reminded pt she is now s/p a sx and with new precautions then she had before.   Rolling Left: Supervision / Stand by assistance;Railings used     Supine>Sit: Supervision / Stand by assistance  Sit>Supine: Supervision / Stand by assistance        flat bed, occasional cues for abd precautions  Transfers  Sit<>Stand: Supervision / Stand by assistance;Assistive device used  Sit<>Stand Assistive Device Details: Four wheeled walker  Sit <> Stand Comments: x4 reps total; from EOB, bedside chair, and 4WW seat  Bed<>Chair/WC: Contact guard assist (touching/steadying assistance)    See flowsheet for objective data reflective of today's session.    Assessment  Impact on Function and Participation: Pt continues to make good progress towards goals. Pt demoing increased act tolerance and IND with ADL tasks this date. Pt reporting less pain then prior session, son endorsing pt mobilizing better this afternoon then this morning.     Barriers to Discharge: Decreased caregiver support  Evaluation/Treatment Tolerance: Patient tolerated treatment well  OT Summary for Next Shift: toileting, review shower safety and abd  precautions    Discharge Recommendations  OT Discharge Recommendations: Follow up with OT at next level of care, Assistance with, Supervision with  Assistance with Details: ADLs, IADLs  Supervision with Details: Functional mobility and transfers         Goals   Goal: Toileting, Toilet transfer, Supervision / Stand by assistance, Adaptive equipment  Estimated Completion Date: 04/22/20  Goal Status: Progressing    Goal: Lower body dressing, Supervision / Stand by assistance, Adaptive equipment  Estimated Completion Date: 04/22/20  Goal Status: Progressing    Goal: Bathing, Minimum Assist (less than 25% help needed), Adaptive equipment  Estimated Completion Date: 04/22/20  Goal Status: [place] Sevan Mcbroom, OT  04/18/2020

## 2020-04-18 NOTE — Nursing Note (Addendum)
Patient Summary  42F with pmhx of previous breast CA, now with colorectal CA s/p hemi colectomy 7/9.    VSS, on 1L at night, desats when sleeping. Reports mild int abd pain/discomfort, managed w/ scheduled tylenol and gabapentin. LFD, tolerating well. Denies N/V. (+) flatus. No BM overnight. Ambulated 1 lap in the evening on FWW. Voids naturally in the [facility], incont x1. Pt's son Merry Proud at bedside overnight, left around 0400. Clustered care to promote sleep. Pt requested not to wake her up for midnight tylenol.   Edited by: Roosvelt Harps, RN at 04/18/2020 0422    Illness Severity  Stable    Edited by: Roosvelt Harps, RN at 04/17/2020 [phone]

## 2020-04-18 NOTE — Progress Notes (Signed)
Progress Note     Geralynn Marlowe Sax Kenton Velvet Bathe") - DOB: 1941/09/17 (79 year old female)  Preferred Pronouns: she/her/hers, patient's name  Admit Date: 04/16/2020  Code Status: Full Code     CHIEF CONCERN / IDENTIFICATION:  Zylah Elsbernd Thayer is a71 year oldfemale with colon adenocarcinoma who underwent a robot-assisted right hemicolectomy on 04/16/20 with Dr. Windell Norfolk.     SUBJECTIVE     Surgical/Procedural Cases on this Admission     Case IDs Date Procedure Surgeon Location Status    [phone] 04/16/20 COLECTOMY, RIGHT, ROBOT-ASSISTED Krane, Venancio Poisson, MD [facility] MAIN OR [facility]          INTERVAL HISTORY:  -Ambulated with PT/OT  -Discontinued PO, pain controled with oral pain medications   -Voided spontaneously following foley removal  -Passing flatus and small BM     SCHEDULED MEDICATIONS:   .  acetaminophen, 1,000 mg, QID  .  alvimopan, 12 mg, BID  .  [START ON 04/22/2020] cephalexin, 250 mg, TID  .  gabapentin, 300 mg, TID  .  heparin, 5,000 Units, q8h [facility]  .  insulin LISPRO, 0-4 Units, q HS  .  insulin LISPRO, 0-5 Units, TID before meals  .  levothyroxine, 137 mcg, Daily empty stomach    INFUSED MEDICATIONS:  dextrose 5 % and lactated ringers, 1 mL/kg/hr (Dosing Weight), Continuous, Last Rate: 1 mL/kg/hr (04/17/20 2205)     PRN MEDICATIONS:  dextrose, 25 mL, PRN  .  dextrose, 50 mL, PRN  .  glucagon, 0.5 mg, PRN  .  glucagon, 1 mg, PRN  .  naloxone, 0.08 mg, q2 min PRN  .  oxyCODONE, 5-10 mg, q4h PRN       OBJECTIVE     Vitals (Most recent in last 24 hrs)     T: 36.3 C (04/18/20 0336)  BP: 124/59 (04/18/20 0336)  HR: 63 (04/18/20 0336)  RR: 16 (04/18/20 0336)  SpO2: 97 % (04/18/20 0336)  T range: Temp  Min: 36.3 C  Max: 37.4 C  Admit weight: 62.5 kg (137 lb 12.6 oz) (04/16/20 0622)  Last weight: 62.5 kg (137 lb 12.6 oz) (04/16/20 0622)       I&Os:     Intake/Output Summary (Last 24 hours) at 04/18/2020 0603  Last data filed at 04/18/2020 0520  Intake 480 ml   Output 2225 ml   Net -1745 ml       Physical  Exam  Constitutional: Well developed and well nourished.Awake, conversing in bed, comfortable.   HEENT: Normocephalic, atraumatic, sclera anicteric, no ocular discharge, EOMI bilaterally, trachea midline  CV: Rate and rhythm regular to palpation.   Pulm:Unlabored breathing on room air  Abdomen: Non-distended, soft, non-tender. Dermabond over incisions- 3x lap port sites with 1x vertical midline incision at umbilicus- 1cm ecchymosis at inferior edge, improving  Neuro: Alert and oriented. Motor and sensory grossly intact.  MGQ:QPYP and tone appropriate. No edema in extremities.   Skin:No obvious rashes or lesions.   Psych: Mood and affect appropriate and congruent    Labs (last 24 hours):   Chemistries  CBC  LFT  Gases, other   [phone]   7.2   AST: 9 ALT: 5  -/-/-/-  -/-/-/-   3.5 24 0.49   11.15 >< 395  AP: 47 T bili: 0.2  Lact (a): - Lact (v): -   eGFR: >60 Ca: 8.3   25   Prot: 5.0 Alb: 2.9  Trop I: - D-dimer: -   Mg:  1.9 PO4: 3.3  ANC: -     BNP: - Anti-Xa: -     ALC: -    INR: -          ASSESSMENT/PLAN   Raechelle Sarti Heiser is a1 year oldfemale with colon adenocarcinoma who underwent a robot-assisted right hemicolectomy on 04/16/20 with Dr. Windell Norfolk. She is continuing to recover well post-operatively and we plan to proceed as follows:    #Colon adenocarcinoma- s/p right hemicolectomy   -ERAS pathway   -Low res diet, well tolerated  -Lovenox teaching today with son [name]   -Pain controlled on oral pain meds  -PT/OT working with pt  -Plan for d/c tomorrow, Maurene Capes will reevaluate patient and decide what additional nursing needs appropriate at that time.     # Mild Leukocytosis  -WBC 8.78--> 11.15  -Afebrile, HR in 70s  -Will follow up repeat CBC today '[ ]'$      #Hyperglycemia  -Sugars well controlled 120s-170s  -Requiring 2U before meals   -SSI    #Normal Pressure Hydrocephalus  -Incontinent of urine at baseline    #Asymptomatic UTI  -UA with E.Coli on 04/15/20, 2x doses of keflex preop  -Will continue  Keflex for a total of 7 days (EOT: 04/22/20)    #Hx of thyroid cancer s/p thyroidectomy  -Levothyroxine 160mg daily - Holding currently   -High T4 noted on preop labs with Dr. SBerneice Heinrich PCP  -Repeat T4 today '[ ]'$      #Chronic Symptomatic Anemia  -H/H 7.2/25  -Serum iron <10   -Iron infusion today     #GERD  #Glaucoma    JElecta Sniff MD  General Surgery PGY-1  Pager #: [phone]

## 2020-04-18 NOTE — Nursing Note (Signed)
Patient Summary  2F with pmhx of previous breast CA, now with colorectal CA s/p hemi colectomy 7/9.    VSS, on RA during day. Reports mild int abd pain, managed w/ scheduled tylenol and gabapentin. LFD, tolerating well. Denies N/V. (+) flatus. Small bm during shift. Ambulated 1 1/2 laps during afternoon on FWW. Voids naturally, incont x2. Pt's son Wille Glaser at bedside.   Edited by: Aldean Baker, RN at 04/18/2020 1817    Illness Severity  stable    Edited by: Aldean Baker, RN at 04/18/2020 [phone]

## 2020-04-18 NOTE — Progress Notes (Signed)
Physical Therapy Treatment    Olivia Mcconnell  [id number]     04/18/2020  Treatment Duration (min): 35 Mins    Functional Level:  Bed Mobility:   Rolling Left: Supervision / Stand by assistance;Railings used  Sit>Supine: Minimum Assist (less than 25% help needed);Cues needed;Railings used(cues log rolling, HOB 30deg (Has adjustable bed at home))  Transfers:   Sit<>Stand: Contact guard assist (touching/steadying assistance);Assistive device used  Bed<>Chair/WC: Contact guard assist (touching/steadying assistance);Assistive device used    Ambulation:   Distance: 377ft, 1 standing rest break  Assistance Needs: Contact guard assist (touching/steadying assistance)  Assistive Device Used: Four wheeled walker  Stairs:    NA  Wheelchair:   NA    Previous Interventions (see Flowsheets for data reflective of daily treatment interventions):  Theract:   Pt received in bed, wet briefs. Assisted with bed mobility to expedite coming to edge of bed.      Transfers: sit <> stand from 4WW, bed, toilet with CGA. Cues for technique     Ambulation: Short distance amb. in room with HHA + CGA, pt states that she typically furniture walks and reaches out. Education on safety techniques and recommendation for using a FWW in tight spaces if 4WW does not fit.     Ambulation for endurance with 4WW: 300' with 1 seated rest break. Cues for standing up tall and not allowing walker to get out in front of her. On turns, needs cues to stay within the walker.            ASSESSMENT/PLAN     PT session focused on safety with all mobility including short and longer distance walking. Recommend pt use an assistive device for all ambulation with assistance, as she is at high risk of falling. Recommend frequent bouts of mobility with nursing staff. Pt would benefit from ongoing PT at d/c to address balance and mobility impairments.     Barriers to Discharge: Intermittent assist ALF           Equipment Recommended: 4 wheeled walker     Goals:  Goal: Bed  mobility, Independent  Estimated Completion Date: 04/23/20  Goal Status: Progressing   Goal: Transfer with AD, Supervision / Stand by assistance  Estimated Completion Date: 04/23/20  Goal Status: Progressing   Goal: Amb with AD, Supervision / Stand by assistance  Estimated Completion Date: 04/23/20  Goal Status: Progressing  Goal: AMPAC >22  Estimated Completion Date: 04/23/20  Goal Status: Progressing       PT Plan of Care:  Treatment/Interventions: Therapeutic exercise, Therapeutic functional activities, Gait/Stairs training   PT Plan: Skilled PT   PT Dosage: 3-5x/week   Plan for Next Session: bed mobility, there-ex. pt can amb. with nursing staff.    Other Recommendations:  PT Discharge Recommendations: Follow up with PT at next level of care (to be determined by primary team)   PT Daily Recommendations: 1PA with 4WW, amb 3-4 x/day    Paul Half, PT

## 2020-04-19 ENCOUNTER — Other Ambulatory Visit (HOSPITAL_BASED_OUTPATIENT_CLINIC_OR_DEPARTMENT_OTHER): Payer: Self-pay

## 2020-04-19 LAB — BASIC METABOLIC PANEL
Anion Gap: 3 — ABNORMAL LOW (ref 4–12)
Calcium: 8.3 mg/dL — ABNORMAL LOW (ref 8.9–10.2)
Carbon Dioxide, Total: 30 meq/L (ref 22–32)
Chloride: 105 meq/L (ref 98–108)
Creatinine: 0.52 mg/dL (ref 0.38–1.02)
Glucose: 100 mg/dL (ref 62–125)
Potassium: 4 meq/L (ref 3.6–5.2)
Sodium: 138 meq/L (ref 135–145)
Urea Nitrogen: 8 mg/dL (ref 8–21)
eGFR by CKD-EPI: >60 mL/min/{1.73_m2} (ref >59)

## 2020-04-19 LAB — CBC (HEMOGRAM)
Hematocrit: 29 % — ABNORMAL LOW (ref 36–45)
Hemoglobin: 8.3 g/dL — ABNORMAL LOW (ref 11.5–15.5)
MCH: 18.8 pg — ABNORMAL LOW (ref 27.3–33.6)
MCHC: 28.2 g/dL — ABNORMAL LOW (ref 32.2–36.5)
MCV: 67 fL — ABNORMAL LOW (ref 81–98)
Platelet Count: 494 10*3/uL — ABNORMAL HIGH (ref 150–400)
RBC: 4.41 10*6/uL (ref 3.80–5.00)
RDW-CV: 18.8 % — ABNORMAL HIGH (ref 11.6–14.4)
WBC: 6.3 10*3/uL (ref 4.3–10.0)

## 2020-04-19 LAB — HEMOGLOBIN A1C, HPLC
Hemoglobin A1C: 6 % (ref 4.0–6.0)
Hemoglobin A1C: 6.1 % — ABNORMAL HIGH (ref 4.0–6.0)

## 2020-04-19 LAB — GLUCOSE POC, ~~LOC~~
Glucose (POC): 118 mg/dL (ref 62–125)
Glucose (POC): 154 mg/dL — ABNORMAL HIGH (ref 62–125)
Glucose (POC): 102 mg/dL (ref 62–125)
Glucose (POC): 146 mg/dL — ABNORMAL HIGH (ref 62–125)

## 2020-04-19 MED ORDER — OXYCODONE HCL 5 MG OR TABS
5.0000 mg | ORAL_TABLET | ORAL | 0 refills | Status: DC | PRN
Start: 2020-04-19 — End: 2020-04-20
  Filled 2020-04-19: qty 20, 2d supply, fill #0

## 2020-04-19 MED ORDER — ACETAMINOPHEN 500 MG OR TABS
1000.0000 mg | ORAL_TABLET | Freq: Four times a day (QID) | ORAL | 0 refills | Status: DC | PRN
Start: 2020-04-19 — End: 2020-04-20
  Filled 2020-04-19: qty 40, 5d supply, fill #0

## 2020-04-19 MED ORDER — CEPHALEXIN 250 MG OR CAPS
250.0000 mg | ORAL_CAPSULE | Freq: Three times a day (TID) | ORAL | 0 refills | Status: DC
Start: 2020-04-19 — End: 2020-04-20
  Filled 2020-04-19: qty 9, 3d supply, fill #0

## 2020-04-19 MED ORDER — ENOXAPARIN SODIUM 40 MG/0.4ML IJ SOSY
40.0000 mg | PREFILLED_SYRINGE | Freq: Every evening | INTRAMUSCULAR | 0 refills | Status: DC
Start: 2020-04-19 — End: 2020-04-20
  Filled 2020-04-19: qty 9.2, 23d supply, fill #0

## 2020-04-19 MED ORDER — ENOXAPARIN SODIUM 40 MG/0.4ML IJ SOSY
40.0000 mg | PREFILLED_SYRINGE | Freq: Every evening | INTRAMUSCULAR | Status: DC
Start: 2020-04-19 — End: 2020-04-20
  Administered 2020-04-19: 40 mg via SUBCUTANEOUS
  Filled 2020-04-19: qty 0.4

## 2020-04-19 NOTE — Nursing Note (Signed)
Patient Summary  62F with pmhx of previous breast CA, now with colorectal CA s/p hemi colectomy 7/9.    VSS, on RA during day. Reports mild int abd pain, managed w/ scheduled tylenol and gabapentin and PRN oxy. LFD, tolerating well. Denies N/V. (+) flatus. Small bm during shift. Voids naturally BSC, incont x1 of urine in brief. Pt's son Wille Glaser at bedside.   Edited by: Levester Fresh, RN at 04/19/2020 0252    Illness Severity  Stable.    Edited by: Levester Fresh, RN at 04/19/2020 [phone]

## 2020-04-19 NOTE — Progress Notes (Signed)
Nutrition Screen:  Patient is POD #3 s/p robot assisted R hemicolectomy on 04/16/20    PMH significant for colon adenocarcinoma, hx thyroid cancer s/p thyroidectomy, NPH    General Height/Weight Data:  Height: 160 cm  Weight for the past 168 hrs:   Weight   04/18/20 1201 62.5 kg (137 lb 12.6 oz)   04/16/20 0622 62.5 kg (137 lb 12.6 oz)     No food allergies     Active Diet Orders   Diet    Adult Diet Regular; Low fiber     Po intake: 66%    Nutritional Risk Factors  Increased healing needs d/t surgery    Interventions:  Pleasant patient states her appetite is ok, has been ordering from the menu, relates normally enjoying my fibers, understands need for altered diet at this time.  Provided patient with Low Fibered diet information, encouraged low fibered diet during this recovery period post-colon resection.  Further oriented to meal system, ok for small meals/snacks as tolerated    Plan:  Will continue to monitor expected progress  Will re-screen prn if length of stay extended    Maple Mirza, DT

## 2020-04-19 NOTE — Nursing Note (Signed)
Illness Severity  Stable.        Patient Summary  60F with pmhx of previous breast CA, now with colorectal CA s/p hemi colectomy 7/9.    VSS, on RA. Oriented and calls appropriately. Often reports still feeling tired/ fatigued but is able to get periods of rest throughout day/ easy to rouse when asleep. Reports very minimal abdo pain, managed w/ scheduled tylenol and gabapentin. Denies N/V, had 1st lrg BM since surgery this shift- she thought it was just gas which is why she was accidentally incontinent of stool; bM soft/ loose. Patient worked with therapies today.     Olivia Mcconnell (son) very involved with care and discharge planning- Visits daily.  Per [name] (SW): will be DC to [facility] 2nd floor (SNF) tomorrow. Son to call [name] (care coordinator there) for timing in AM- will aim for 11am.Pt already lives at [name] in independent housing- just requiring minimal assistance for the immediate post op period. (esp. lovenox injections at home).

## 2020-04-19 NOTE — Discharge Summary (Signed)
Discharge Summary     Olivia Mcconnell") - DOB: [birth year] (79 year old female)  Preferred Pronouns: she/her/hers, patient's name  PCP: Olivia Hatchet, MD   Code Status: Full Code     DATE OF ADMISSION: 04/16/2020  DATE OF DISCHARGE: 04/19/2020  DISCHARGE TEAM & ATTENDING: Surgery S-General & Olivia Leyden, MD     ADMISSION DIAGNOSIS: Cancer of Ascending Colon   DISCHARGE DIAGNOSIS: Cancer of ascending colon [facility])    HOSPITAL PROBLEM LIST:   Principal Problem:    Cancer of ascending colon ([place])  Active Problems:    Cecal cancer ([place])  Resolved Problems:    * No resolved hospital problems. *      DISCHARGE FOLLOW-UP VISITS/APPOINTMENTS:    Upcoming appointments at [facility]:  Future Appointments   Date Time Provider [place]   05/21/2020  3:15 PM Olivia Leyden, MD [facility] SURGICA        Additional follow-up:  No follow-up provider specified.    PENDING RESULTS THAT REQUIRE FOLLOW-UP (as of this summary):  Pending Labs     Order Current Status    Hemoglobin A1C, HPLC In process    Hemoglobin A1C, HPLC In process    Pathology, Surgical Preliminary result          INCIDENTAL FINDINGS THAT REQUIRE FOLLOW-UP:   Elevated T4: Hx of thyroid cancer with total thyroidectomy, currently on levothyroxine.  Previously noted to be elevated pre-operatively at previous PCP visit, repeat T4 in the hospital was elevated (2.0). Held Levothyroxine while inpatient.    Low Iron: Serum iron <10. Known chronic anemia prior to admission. Two 125mg  iron infusions given while inpatient.     THERAPEUTIC RECOMMENDATIONS:   You may shower with your surgical incisions, but please do not soak or have tub baths if you have open/healing wounds or drains in place, as this increases your risk of infection. Please call or notify us if you notice increased pain, swelling, redness, drainage, or fever >101, as these can all be signs of an infection.    Please always seek immediate medical attention for chest pain,  shortness of breath, extreme weakness, or blood in your stools/vomit.    You are being sent home on oxycodone and tylenol for your pain. Please try to wean off of this as quickly as you are able. While you are on narcotic pain medication, it is also a good idea to take a stool softener (Miralax), to avoid constipation. You may stop the stool softener if you are having loose stools. You should NOT take a stool softener if you have an ileostomy or if you have a diagnosis of inflammatory bowel disease, unless specifically directed to take it.    Please do not drive or operate machinery while you are taking narcotic pain medications.    Please do not hesitate to call with any questions or problems.    Activity: No lifting beyond 10 pounds for 6 weeks following surgery, or as directed in your follow up appointment. Please continue to walk frequently to reduce the risk of developing a blood clot.    Lovenox instructions: You are being sent home on Lovenox. Lovenox is a blood thinner in the form of an injection that you will give yourself once daily at bedtime for 28 days. It is normal to have a small amount of bruising at the injection site. However, if you notice any other abnormal bleeding please seek medical attention.    Please continue to  not take your levothyroxine until you follow-up with your PCP.    ALLERGIES:  Ciprofloxacin and Sulfa antibiotics      DISCHARGE MEDICATIONS:   Current Discharge Medication List      START taking these medications    Details   acetaminophen 500 MG tablet Take 2 tablets (1,000 mg) by mouth every 6 hours as needed for pain or severe pain.  Qty: 40 tablet, Refills: 0    Associated Diagnoses: Cancer of ascending colon (HCC)      cephalexin 250 MG capsule Take 1 capsule (250 mg) by mouth 3 times a day for 3 days.  Qty: 9 capsule, Refills: 0    Associated Diagnoses: Acute cystitis without hematuria      enoxaparin 40 MG/0.4ML injection Inject 0.4 mL (40 mg) under the skin once every 24  hours at bedtime for 23 days.  Qty: 9.2 mL, Refills: 0    Associated Diagnoses: Cancer of ascending colon (HCC)      oxyCODONE 5 MG tablet Take 1 to 2 tablets (5 to 10 mg) by mouth every 4 hours as needed for moderate or severe pain for up to 5 days.  Qty: 20 tablet, Refills: 0    Associated Diagnoses: Cancer of ascending colon ([place])         CONTINUE these medications which have NOT CHANGED    Details   amLODIPine 10 MG tablet Take 10 mg by mouth daily.   Qty: 90 tablet, Refills: 3    Associated Diagnoses: Essential hypertension      IRON OR Take 1 tablet by mouth daily. Not yet started: [id number]      levothyroxine 137 MCG tablet Take 137 mcg by mouth daily on an empty stomach.      Multiple Vitamin (MULTIVITAMIN OR) Take 1 tablet by mouth daily.         STOP taking these medications       metroNIDAZOLE 500 MG tablet Comments:   Reason for Stopping:         neomycin 500 MG tablet Comments:   Reason for Stopping:         polyethylene glycol with electrolytes (MoviPrep) 100 g solution Comments:   Reason for Stopping:               BRIEF ADMISSION HISTORY:   Olivia Mcconnell is an 79 year old female with a recently diagnosed ascending colon adenocarcinoma.  Staging CT demonstrates possible local lymph node involvement but no definitive distant metastasis.  The decision was therefore made to proceed with surgical resection.  The risks of the procedure including but not limited to bleeding, infection, injury to adjacent structures, and anastomotic leak were discussed with the patient and she agreed to proceed. (Dr. Windell Norfolk Op Note- 04/16/20).    HOSPITAL COURSE:   Olivia Mcconnell is an 79 year old female with a recently diagnosed ascending colon adenocarcinoma.  Staging CT demonstrates possible local lymph node involvement but no definitive distant metastasis. She underwent a robotic right hemicolectomy with a primary stapled anastomosis on 04/16/20 with Dr. Windell Norfolk. Foley was left in at the end of the case and  incisions were closed primarily with dermabond on top for dressing. Post-operatively, her pain was controlled with dilaudid PCA, scheduled tylenol. Of note, 04/15/20, Ms Hunn saw her PCP and UA was completed which showed asymptomatic bacteruria, she received 2x doses of Keflex preoperatively and CBC day of surgery showed non-elevated WBC. Keflex was continued post-op for a total of 7  days (end of treatment date 04/22/20).     POD1: Tolerated small low res dinner on POD0. Denies nausea, pain was well controlled. Foley was removed and she was able to void spontaneously following removal. Patient is incontinent to urine at baseline, had several episodes of incontinence to urine. Had small bowel movement. Insulin drip was started immediately post-op given elevated blood sugars, however sugars were well controlled and transitioned to sliding scale insulin as needed. She is not diabetic and does not use insulin at baseline. Given high T4 found at pre-op visit with PCP, levothyroxine was held while inpatient and repeat T4 was found to be still elevated. She ate multiple meals today which she tolerated well.     POD2: Known chronic anemia, serum iron <10, 125mg  iron infusion given. Lovenox teaching done with son Wille Glaser present. No longer requiring PCA, and was transitioned to oral pain mediation only, with pain well controlled. She continued to work with PT/OT who recommended she could return to [facility] where she was previously living given a re-evalution could be completed for additional help if necessary.     POD3: Another iron transfusion completed today. Entereg was discontinued and she was transitioned for subQ heparin to lovenox for discharge. Continued to have bowel movements and tolerate a regular diet. Discussion with son Wille Glaser and Ms Enke regarding plan for discharge and agreed to go to 2nd floor of [facility] (skilled nursing floor).    POD4: Patient had a large bowel movement last night. Reporting mild  abdominal pain, has taken minimal pain medicine so educated patient on use for discharge. Patient will go home on lovenox for 28 days as well as miralax as needed. She discharged to the skilled nursing facility on the 2nd floor of [facility] with her son Wille Glaser.      DISPOSITION:    [facility] facility unit (2nd floor)    CONDITION: good     CONSULTS COMPLETED:    IP CONSULT TO PHARMACY     OPERATIONS/PROCEDURES:  Surgical/Procedural Cases on this Admission     Case IDs Date Procedure Surgeon Location Status    [phone] 04/16/20 COLECTOMY, RIGHT, ROBOT-ASSISTED Krane, Venancio Poisson, MD [facility] MAIN OR Comp          Additional procedures:  None       No discharge procedures on file.    DISCHARGE PHYSICAL EXAM:   Vitals (Most recent in last 24 hrs)     T: 36.9 C (04/19/20 0332)  BP: 104/53 (04/19/20 0332)  HR: 55 (04/19/20 0332)  RR: 16 (04/19/20 0332)  SpO2: 93 % (04/19/20 0332)  T range: Temp  Min: 35.8 C  Max: 36.9 C  Admit weight: 62.5 kg (137 lb 12.6 oz) (04/16/20 0622)  Last weight: 62.5 kg (137 lb 12.6 oz) (04/18/20 1201)       Physical Exam  Constitutional: Well developed and well nourished.Awake, conversing in bed, comfortable.   HEENT: Normocephalic, atraumatic, sclera anicteric, no ocular discharge, EOMI bilaterally, trachea midline  CV: Rate and rhythm regular to palpation.   Pulm:Unlabored breathing onroom air  Abdomen: Non-distended, soft, non-tender. Dermabond over incisions- 3x lap port sites with 1x vertical midline incision at umbilicus- 1cm ecchymosis at inferior edge, improving  Neuro: Alert and oriented. Motor and sensory grossly intact.  UEA:VWUJ and tone appropriate. No edema in extremities.   Skin:No obvious rashes or lesions.   Psych: Mood and affect appropriate and congruent         Medicine physicians mentioned in this  note can be reached by calling [employer] at [phone]. If any part of this transcript is missing or to request other transcripts for this patient call  [phone]. For online access to patient records enroll in [place] at [email or web address].

## 2020-04-19 NOTE — Progress Notes (Addendum)
Addendum:  MSW received notification from [facility] admissions coordinator, that pt is clinically accept and can dc tomorrow morning / afternoon.     Social Work Progress Note    ASSESSMENT:  Admit reason: This is a 79 year old old female admitted for Cancer of ascending colon ([place]) [C18.2]  Cecal cancer ([place]) [C18.0].  Anticipated disposition/needs: 7/13 [facility] pending final acceptance via personal ride   Potential barriers/concerns:    Funding: Payor: MEDICARE / Plan: MEDICARE PART A AND B / Product Type: Medicare  Primary Decision Maker: Patient is their own decision maker    Outpatient Providers:  Patient Care Team:  Meta Hatchet, MD as PCP - General (Internal Medicine)    INTERVENTIONS and REFERRALS:  The following interventions have been initiated: Discharge planning;Consult with healthcare team with referrals made to [facility]     MSW participated in multidisciplinary rounds to discuss ongoing medical plan and discharge planning. Surg S provider reports pt is medically stable for discharge and request MSW assistance for discharge planning. MSW indicated pt lives at [place] in independent living (ILF) apartment but PT/OT currently recommending 24hr assist, so MSW will need to speak with facility to inquire what is needed for pt to return to apartment vs SNF.     After numerous phone calls to [facility] [phone]), MSW was informed they do not have a contact for ILF but MSW can speak with assisted living director Yvone Neu. MSW spoke with Yvone Neu [phone]) who indicated there is no availability on the assisted living facility and if pt requires additional support the two options are as followed: 1) Return to [place] with [facility] and private hire caregivers Zada Finders can help arrange @ [phone]) or family (confirmed visitor policy allows family to stay overnight) or 2) [facility] on the second floor, admissions coordinator is [name] @  [phone]).     MSW met with pt and son to introduce self and then reviewed extensively over the options noted above. Son became frustrated with the options presented and requested to speak with the doctor. At that time, MSW left the room and requested MD speak with pt/son.     MSW was later notified by Surg S MD that pt/son prefer to dc to [facility] SN[facility].    MSW initiated SNF referral via [employer]. MSW spoke with [facility] admissions coordinator [name] [phone]) who confirmed she will review over referral today and anticipate ability to accept pt tomorrow morning pending final review. MSW inquires about time of dc and she indicates pt's son [name] can call to confirm discharge timing. She also requests COVID test within 48hrs of discharge.    Surg S MD provided update to pt/son.      PLAN or OUTCOME:  7/13 [facility] SN[facility] pending final acceptance via personal ride     Lajean Manes, MSW  General Surgery and Plastics Social Worker  [employer] [facility]

## 2020-04-20 ENCOUNTER — Other Ambulatory Visit (HOSPITAL_BASED_OUTPATIENT_CLINIC_OR_DEPARTMENT_OTHER): Payer: Self-pay

## 2020-04-20 DIAGNOSIS — Z48815 Encounter for surgical aftercare following surgery on the digestive system: Secondary | ICD-10-CM

## 2020-04-20 LAB — GLUCOSE POC, ~~LOC~~: Glucose (POC): 130 mg/dL — ABNORMAL HIGH (ref 62–125)

## 2020-04-20 LAB — COVID-19 CORONAVIRUS QUALITATIVE PCR: COVID-19 Coronavirus Qual PCR Result: NOT DETECTED

## 2020-04-20 MED ORDER — OXYCODONE HCL 5 MG OR TABS
5.0000 mg | ORAL_TABLET | ORAL | 0 refills | Status: AC | PRN
Start: 2020-04-20 — End: 2020-04-25

## 2020-04-20 MED ORDER — CALCIUM CARBONATE ANTACID 500 MG OR CHEW
500.0000 mg | CHEWABLE_TABLET | Freq: Two times a day (BID) | ORAL | Status: DC
Start: 2020-04-20 — End: 2020-04-20
  Administered 2020-04-20: 500 mg via ORAL
  Filled 2020-04-20: qty 1

## 2020-04-20 MED ORDER — ENOXAPARIN SODIUM 40 MG/0.4ML IJ SOSY
40.0000 mg | PREFILLED_SYRINGE | Freq: Every evening | INTRAMUSCULAR | 0 refills | Status: AC
Start: 2020-04-20 — End: 2020-05-13

## 2020-04-20 MED ORDER — CEPHALEXIN 250 MG OR CAPS
250.0000 mg | ORAL_CAPSULE | Freq: Three times a day (TID) | ORAL | 0 refills | Status: AC
Start: 2020-04-20 — End: 2020-04-23

## 2020-04-20 MED ORDER — AMLODIPINE BESYLATE 10 MG OR TABS
10.0000 mg | ORAL_TABLET | Freq: Every day | ORAL | 0 refills | Status: AC
Start: 2020-04-20 — End: ?

## 2020-04-20 MED ORDER — IRON 325 (65 FE) MG OR TABS: 325.0000 mg | ORAL_TABLET | Freq: Every day | ORAL | 0 refills | Status: DC

## 2020-04-20 MED ORDER — ACETAMINOPHEN 500 MG OR TABS: 1000.0000 mg | ORAL_TABLET | Freq: Four times a day (QID) | ORAL | 0 refills | Status: DC | PRN

## 2020-04-20 MED ORDER — LEVOTHYROXINE SODIUM 137 MCG OR TABS
137.0000 ug | ORAL_TABLET | Freq: Every day | ORAL | 0 refills | Status: AC
Start: 2020-04-20 — End: ?

## 2020-04-20 MED ORDER — POLYETHYLENE GLYCOL 3350 17 GM/SCOOP OR POWD
17.0000 g | Freq: Every day | ORAL | 0 refills | Status: AC
Start: 2020-04-20 — End: 2020-05-20

## 2020-04-20 MED ORDER — POLYETHYLENE GLYCOL 3350 17 GM/SCOOP OR POWD
17.0000 g | Freq: Every day | ORAL | 0 refills | Status: DC
Start: 2020-04-20 — End: 2020-04-20
  Filled 2020-04-20: qty 510, 30d supply, fill #0

## 2020-04-20 NOTE — Progress Notes (Signed)
Progress Note     Olivia Mcconnell") - DOB: 07-Jul-1941 (79 year old female)  Preferred Pronouns: she/her/hers, patient's name  Admit Date: 04/16/2020  Code Status: Full Code     CHIEF CONCERN / IDENTIFICATION:  Olivia Mcconnell is a35 year oldfemale with colon adenocarcinoma who underwent a robot-assisted right hemicolectomy on 04/16/20 with Dr. Windell Norfolk     SUBJECTIVE     Surgical/Procedural Cases on this Admission     Case IDs Date Procedure Surgeon Location Status    [phone] 04/16/20 COLECTOMY, RIGHT, ROBOT-ASSISTED Windell Norfolk, Venancio Poisson, MD [facility] MAIN OR Comp          INTERVAL HISTORY:  -Continuing to do well, working with PT/OT  -Tolerating low res diet  -Discussed with Ms Elenes and her son Wille Glaser, plan to d/c to assisted living floor of [place]:   .  acetaminophen, 1,000 mg, QID  .  [START ON 04/22/2020] cephalexin, 250 mg, TID  .  enoxaparin, 40 mg, q HS  .  insulin LISPRO, 0-4 Units, q HS  .  insulin LISPRO, 0-5 Units, TID before meals  .  [Held By Provider] levothyroxine, 137 mcg, Daily empty stomach  .  sodium ferric gluconate complex, 125 mg, Daily    INFUSED MEDICATIONS:       PRN MEDICATIONS:  dextrose, 25 mL, PRN  .  dextrose, 50 mL, PRN  .  glucagon, 0.5 mg, PRN  .  glucagon, 1 mg, PRN  .  naloxone, 0.08 mg, q2 min PRN  .  oxyCODONE, 5-10 mg, q4h PRN       OBJECTIVE     Vitals (Most recent in last 24 hrs)     T: 36.3 C (04/20/20 0015)  BP: (!) 151/79 (04/20/20 0015)  HR: 69 (04/20/20 0015)  RR: 16 (04/20/20 0015)  SpO2: 96 % (04/20/20 0015)  T range: Temp  Min: 36.3 C  Max: 36.6 C  Admit weight: 62.5 kg (137 lb 12.6 oz) (04/16/20 0622)  Last weight: 62.5 kg (137 lb 12.6 oz) (04/18/20 1201)       I&Os:     Intake/Output Summary (Last 24 hours) at 04/20/2020 0531  Last data filed at 04/20/2020 0155  Intake --   Output 200 ml   Net -200 ml       Physical Exam  Constitutional: Well developed and well nourished.Awake, conversing in bed, comfortable.   HEENT:  Normocephalic, atraumatic, sclera anicteric, no ocular discharge, EOMI bilaterally, trachea midline  CV: Rate and rhythm regular to palpation.   Pulm:Unlabored breathing onroom air  Abdomen: Non-distended, soft, non-tender. Dermabond over incisions- 3x lap port sites with 1x vertical midline incision at umbilicus- 1cm ecchymosis at inferior edge, improving  Neuro: Alert and oriented. Motor and sensory grossly intact.  JSE:GBTD and tone appropriate. No edema in extremities.   Skin:No obvious rashes or lesions.   Psych: Mood and affect appropriate and congruent    Labs (last 24 hours):   Chemistries  CBC  LFT  Gases, other   138 105 8 146   8.3   AST: - ALT: -  -/-/-/-  -/-/-/-   4.0 30 0.52   6.30 >< 494  AP: - T bili: -  Lact (a): - Lact (v): -   eGFR: >60 Ca: 8.3   29   Prot: - Alb: -  Trop I: - D-dimer: -   Mg: - PO4: -  ANC: -     BNP: -  Anti-Xa: -     ALC: -    INR: -            ASSESSMENT/PLAN   Olivia Mcconnell is a9 year oldfemale with colon adenocarcinoma who underwent a robot-assisted right hemicolectomy on 04/16/20 with Dr. Windell Norfolk. She is continuing to recover well post-operatively and we plan to proceed as follows:    #Colon adenocarcinoma- s/p right hemicolectomy   -ERAS pathway   -Low res diet, well tolerated  -Pain controlled on oral pain meds  -PT/OT working with pt  -Plan for d/c 04/20/20, [facility] can accept pt to 2nd floor (SNF) in morning     # Mild Leukocytosis  -Resolved     #Hyperglycemia  -Resolved, sugars well controlled without insulin 100s-150s    #Normal Pressure Hydrocephalus  -Incontinent of urine at baseline    #Asymptomatic UTI  -UA with E.Coli on 04/15/20, 2x doses of keflex preop  -Will continue Keflex for a total of 7 days (EOT: 04/22/20)    #Hx of thyroid cancer s/p thyroidectomy  -Levothyroxine 180mg daily- Holding currently   -repeat T4 elevated to 2.0    #Chronic Symptomatic Anemia  -H/H 7.2/25  -Serum iron <10  -Iron infusion today      #GERD  #Glaucoma    JElecta Sniff MD  General Surgery PGY-1  Pager #: [phone]

## 2020-04-20 NOTE — Nursing Note (Signed)
Patient Summary  55F with pmhx of previous breast CA, now with colorectal CA s/p hemi colectomy 7/9.    Assumed care at 1900. AxO4. VSS on RA. C/o moderate abd pain, managed w/ scheduled tylenol. Large loose BM overnight. Concern for new stool incontinence.      Illness Severity  Stable.

## 2020-04-20 NOTE — Discharge Instructions (Signed)
You may shower with your surgical incisions, but please do not soak or have tub baths if you have open/healing wounds or drains in place, as this increases your risk of infection.  Please call or notify us if you notice increased pain, swelling, redness, drainage, or fever >101, as these can all be signs of an infection.    Diet:  low fiber diet  Please always seek immediate medical attention for chest pain, shortness of breath, extreme weakness, or blood in your stools/vomit.   You are being sent home on oxycodone and tylenol for your pain.  Please try to wean off of the pxycodone as quickly as you are able.  While you are on narcotic pain medication, it is also a good idea to take a stool softener (Miralax), to avoid constipation.  You may stop the stool softener if you are having loose stools.    Please do not drive or operate machinery while you are taking narcotic pain medications.  Please do not hesitate to call with any questions or problems.     Activity: No lifting beyond 10 pounds for 6 weeks following surgery, or as directed in your follow up appointment. Please continue to walk frequently to reduce the risk of developing a blood clot.   Lovenox instructions: You are being sent home on Lovenox.  Lovenox is a blood thinner in the form of an injection that you will give yourself once daily at bedtime for 28 days.  It is normal to have a small amount of bruising at the injection site.  However, if you notice any other abnormal bleeding please seek medical attention.

## 2020-04-20 NOTE — Nursing Note (Signed)
Patient Summary  24F with pmhx of previous breast CA, now with colorectal CA s/p hemi colectomy 7/9.    Pt stable on RA, ambulating w/ walker. Report given to RN @ [facility]. Discharge packet given to son and informed to give to staff once arrived back @ [facility]. Extra AVS printed for son. IV d/c. Tolerating diet. D/C via wheelchair and transported via private vehicle to prior living situation.    Illness Severity  Stable.

## 2020-04-20 NOTE — Progress Notes (Signed)
Social Work and Care Coordination Discharge Note:    Reason for admission: Cancer of ascending colon ([place]) [C18.2]  Cecal cancer ([place]) [C18.0] Funding: Payor: MEDICARE / Plan: MEDICARE PART A AND B / Product Type: Medicare    Anticipated discharge plan: [facility] via personal vehicle    Anticipated Discharge Date: 7/13    Primary Decision Maker: Patient is their own decision maker     Discharge Plan: Living situation post discharge: [place] name: [place] address: [place] phone: [place] fax #:  [phone]   Facility contact:[facility]  RN-RN report number: [phone] or [phone]      Discharge Checklist:  PASRR: Yes  PCS (as applicable): NA  DC summary and orders: Yes    Admission's preferred methods of receiving documents: Fax   SNF Transfer Checklist provided to PSS: Yes     Discharge Transportation:  Method of transport: [place]: [facility]   Date/Time:  7/13 @ 1100/1130    Comments: MSW confirmed with Surgery S, pt is medically ready for dc today and son will transport @ 1100. MSW faxed all dc clinicals to [facility] and verbally confirmed she received paperwork. MSW also spoke with pt's bedside RN and provided RN-RN#.    Lajean Manes, MSW  General Surgery and Plastics Social Worker  [facility]

## 2020-04-22 LAB — PATHOLOGY, SURGICAL
REGIONAL LYMPH NODES (PN): POSITIVE
TUMOR BUD SCORE: LOW

## 2020-04-23 ENCOUNTER — Telehealth (HOSPITAL_BASED_OUTPATIENT_CLINIC_OR_DEPARTMENT_OTHER): Payer: Self-pay | Admitting: Surgery

## 2020-04-23 NOTE — Telephone Encounter (Signed)
RETURN CALL: Voicemail - Detailed Message      SUBJECT:  Orders     ORDERS FOR: C Diff Test  [facility] OR FAXED ORDER REQUEST: To be faxed to [phone]  ADDITIONAL INFORMATION: Patient stays at the Rehab in [place] and patient denies loose stools and Dr Marylou Mccoy is requiring a CDiff test for patient but they need an order for the patient. Please send the order so they can complete the test and call Vy if you have any questions. Thanks!

## 2020-04-24 ENCOUNTER — Other Ambulatory Visit (HOSPITAL_BASED_OUTPATIENT_CLINIC_OR_DEPARTMENT_OTHER): Payer: Self-pay | Admitting: Student in an Organized Health Care Education/Training Program

## 2020-04-24 DIAGNOSIS — Z9889 Other specified postprocedural states: Secondary | ICD-10-CM

## 2020-04-24 DIAGNOSIS — R197 Diarrhea, unspecified: Secondary | ICD-10-CM

## 2020-04-26 NOTE — Telephone Encounter (Signed)
Results from test received from [facility] this morning. No need for order.

## 2020-04-26 NOTE — Telephone Encounter (Signed)
Spoke to nurse from [facility].  She states that they are following up on labs and have already adjusted her levothyroxine based on labs.  They were sent as FYI.

## 2020-05-03 ENCOUNTER — Encounter (HOSPITAL_BASED_OUTPATIENT_CLINIC_OR_DEPARTMENT_OTHER): Payer: Medicare Other

## 2020-05-03 ENCOUNTER — Ambulatory Visit (HOSPITAL_BASED_OUTPATIENT_CLINIC_OR_DEPARTMENT_OTHER): Payer: Medicare Other

## 2020-05-03 ENCOUNTER — Ambulatory Visit (HOSPITAL_BASED_OUTPATIENT_CLINIC_OR_DEPARTMENT_OTHER): Payer: Medicare Other | Admitting: Hematology & Oncology

## 2020-05-03 ENCOUNTER — Ambulatory Visit: Payer: Medicare Other | Attending: Surgery | Admitting: Surgery

## 2020-05-03 ENCOUNTER — Encounter (HOSPITAL_BASED_OUTPATIENT_CLINIC_OR_DEPARTMENT_OTHER): Payer: Self-pay | Admitting: Nursing

## 2020-05-03 VITALS — BP 125/77 | HR 73 | Temp 98.4°F | Resp 16 | Ht 62.17 in | Wt 136.6 lb

## 2020-05-03 DIAGNOSIS — Z792 Long term (current) use of antibiotics: Secondary | ICD-10-CM | POA: Insufficient documentation

## 2020-05-03 DIAGNOSIS — Z483 Aftercare following surgery for neoplasm: Secondary | ICD-10-CM | POA: Insufficient documentation

## 2020-05-03 DIAGNOSIS — C182 Malignant neoplasm of ascending colon: Secondary | ICD-10-CM | POA: Insufficient documentation

## 2020-05-03 DIAGNOSIS — A0472 Enterocolitis due to Clostridium difficile, not specified as recurrent: Secondary | ICD-10-CM | POA: Insufficient documentation

## 2020-05-03 DIAGNOSIS — I10 Essential (primary) hypertension: Secondary | ICD-10-CM | POA: Insufficient documentation

## 2020-05-03 DIAGNOSIS — Z803 Family history of malignant neoplasm of breast: Secondary | ICD-10-CM | POA: Insufficient documentation

## 2020-05-03 DIAGNOSIS — Z85850 Personal history of malignant neoplasm of thyroid: Secondary | ICD-10-CM | POA: Insufficient documentation

## 2020-05-03 DIAGNOSIS — D473 Essential (hemorrhagic) thrombocythemia: Secondary | ICD-10-CM | POA: Insufficient documentation

## 2020-05-03 DIAGNOSIS — R109 Unspecified abdominal pain: Secondary | ICD-10-CM | POA: Insufficient documentation

## 2020-05-03 DIAGNOSIS — Z08 Encounter for follow-up examination after completed treatment for malignant neoplasm: Secondary | ICD-10-CM | POA: Insufficient documentation

## 2020-05-03 DIAGNOSIS — Z8051 Family history of malignant neoplasm of kidney: Secondary | ICD-10-CM | POA: Insufficient documentation

## 2020-05-03 LAB — CBC, DIFF
% Basophils: 1 %
% Eosinophils: 2 %
% Immature Granulocytes: 0 %
% Lymphocytes: 29 %
% Monocytes: 8 %
% Neutrophils: 60 %
Absolute Eosinophil Count: 0.12 10*3/uL (ref 0.00–0.50)
Absolute Lymphocyte Count: 1.87 10*3/uL (ref 1.00–4.80)
Basophils: 0.04 10*3/uL (ref 0.00–0.20)
Hematocrit: 36 % (ref 36–45)
Hemoglobin: 10.5 g/dL — ABNORMAL LOW (ref 11.5–15.5)
Immature Granulocytes: 0.01 10*3/uL (ref 0.00–0.05)
MCH: 21.2 pg — ABNORMAL LOW (ref 27.3–33.6)
MCHC: 29.1 g/dL — ABNORMAL LOW (ref 32.2–36.5)
MCV: 73 fL — ABNORMAL LOW (ref 81–98)
Monocytes: 0.49 10*3/uL (ref 0.00–0.80)
Neutrophils: 3.85 10*3/uL (ref 1.80–7.00)
Platelet Count: 346 10*3/uL (ref 150–400)
RBC: 4.96 10*6/uL (ref 3.80–5.00)
WBC: 6.38 10*3/uL (ref 4.3–10.0)

## 2020-05-03 NOTE — Progress Notes (Signed)
New patient manual and team contact sheet provided to patient and son. FOLFOX and CAPOX handout also given for patient/family to review. Planning on follow-up phone call to patient to see if treatment plan has been selected.

## 2020-05-03 NOTE — Progress Notes (Signed)
[place]  Colorectal Surgery Post-Operative Visit    ONCOLOGY CARE TEAM  Patient Care Team:  Jennye Boroughs, MD as Oncologist (Hematology/Oncology)   Jolyn Nap, MD (Colorectal Surgery)    PCP  Meta Hatchet, MD    IDENTIFICATION/CC  Olivia Mcconnell is a 79 year old female with a history of ascending colon adenocarcinoma who is now s/p R hemicolectomy on 04/16/20. She returns to clinic for her post-operative visit.    ONCOLOGIC HISTORY  Oncology History Overview   04/01/2020: Colonoscopy done for iron deficiency anemia, shows obstructing cecal mass, path consistent with invasive adenocarcinoma   04/06/2020: CT chest/abdomen/pelvis shows primary cecal neoplasm measuring 7.3 x 6.7 x 5.7cm invading ileocecal valve and distal ileum, with transmural penetration of the tumor and local invasion into pericolonic fat. Local lymphadenopathy in the cecal mesentery and R ileocolonic mesentery, largest measuring 2.1 x 1.4 x 1.7cm. No distant metastatic disease.   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 PT3 pN2a             INTERVAL HISTORY  Olivia Mcconnell was admitted to [facility] on 04/16/20 for her planned R hemicolectomy. The procedure was uncomplicated but notable for adhesions and enlarged ileocolic lymph nodes which were resected with the specimen. She received iron infusions x 2 post-operatively d/t her history of IDA. She was discharged to a SNF on 04/19/20.    She returns to clinic with her son, Olivia Mcconnell. Olivia Mcconnell tested positive for C. Diff while staying in the [place] [facility]. She is currently having 1-2 formed BMs/day. She reports her stool is dark but denies BRBPR. She endorses abdominal discomfort which has improved slightly since discharge. She is not  taking anything for it. She thinks the pain is more superficial rather than deep within her abdomen. She is tolerating a diet however endorses early satiety. She reports feeling a bit unsteady on her feet which is not significantly changed from her baseline.    REVIEW OF SYSTEMS  Complete ROS performed and is otherwise negative as related to chief complaint.      ALLERGIES:  Review of patient's allergies indicates:  Allergies   Allergen Reactions   . Ciprofloxacin Unknown   . Sulfa Antibiotics Unknown      HOME MEDICATIONS  Current Outpatient Medications:   .  acetaminophen 500 MG tablet, Take 2 tablets (1,000 mg) by mouth every 6 hours as needed for pain or severe pain., Disp: 40 tablet, Rfl: 0  .  amLODIPine 10 MG tablet, Take 1 tablet (10 mg) by mouth daily., Disp: 7 tablet, Rfl: 0  .  enoxaparin 40 MG/0.4ML injection, Inject 0.4 mL (40 mg) under the skin once every 24 hours at bedtime for 23 days., Disp: 9.2 mL, Rfl: 0  .  Ferrous Sulfate (iron) 325 (65 Fe) MG tablet, Take 1 tablet (325 mg) by mouth daily., Disp: 7 tablet, Rfl: 0  .  levothyroxine 137 MCG tablet, Take 1 tablet (137 mcg) by mouth daily on an empty stomach., Disp: 7 tablet, Rfl: 0  .  polyethylene glycol 3350 17 GM/SCOOP oral powder, Take 17 g by mouth daily. Fill cap with powder to the 17 gram mark and dissolve in 4 to 8 ounces of water., Disp: 510 g, Rfl: 0     PAST MEDICAL / PAST SURGICAL HISTORY:  Past Medical History:   Diagnosis Date   . Thyroid cancer ([place]) 2011   . Breast mass 1990    Removed   . Anemia     [place]   . Colon cancer [facility])      Past Surgical History:   Procedure Laterality Date   . COLONOSCOPY  04/01/2020   . PR APPENDECTOMY  1990s   . PR UNLISTED PROCEDURE BREAST Right 1990   . THYROID SURGERY  2011    total thyroidectomy   . TOTAL KNEE ARTHROPLASTY Left 09/2014   + R hemicolectomy    FAMILY HISTORY:  Family History     Problem (# of Occurrences) Relation (Name,Age of Onset)    Breast Cancer (1) Sister    Kidney Cancer (1)  Brother    Rheumatoid Arthritis (1) Father         SOCIAL HISTORY:  Social History     Tobacco Use   . Smoking status: Never Smoker   . Smokeless tobacco: Never Used   Substance Use Topics   . Alcohol use: Not Currently     Frequency: Monthly or less   . Drug use: Never        ECOG PERFORMANCE STATUS  ECOG:  1    PHYSICAL EXAMINATION  VITAL SIGNS: Temperature: 36.9, HR: 73, RR: 16, SpO2: 99%, Weight: 62kg, BP: 125/77  GENERAL:  WDWN female in NAD.  HEENT:   NCAT, EOMI, Anicteric  NECK:   Supple  HEART:   Regular rate  LUNGS:   Breathing comfortably on room air  ABDOMEN:   Slight tenderness to palpation. Incisions healing well without exudative drainage.  SKIN:    No Rashes on exposed skin.  EXT:     Trace edema bilaterally.  NEURO:  A&O    LABORATORY STUDIES  No results found for any visits on 05/03/20.    Recent Labs     04/09/20  1611   CEA 2.5       IMAGING/PERTINENT STUDIES  Results for orders placed or performed during the hospital encounter of 04/16/20   CBC   Result Value Ref Range    WBC 8.78 4.3 - 10.0 10*3/uL    RBC 4.19 3.80 - 5.00 10*6/uL    Hemoglobin 7.7 (L) 11.5 - 15.5 g/dL    Hematocrit 27 (L) 36 - 45 %    MCV 65 (L) 81 - 98 fL    MCH 18.4 (L) 27.3 - 33.6 pg    MCHC 28.3 (L) 32.2 - 36.5 g/dL    Platelet Count 427 (H) 150 - 400 10*3/uL    RDW-CV 18.3 (H) 11.6 - 14.4 %   Comprehensive Metabolic Panel   Result Value Ref Range    Sodium 139 135 - 145 meq/L    Potassium 3.5 (L) 3.6 - 5.2 meq/L    Chloride 106 98 - 108 meq/L    Carbon Dioxide, Total 24 22 - 32 meq/L    Anion Gap 9 4 - 12    Glucose 148 (H) 62 - 125 mg/dL    Urea Nitrogen 9 8 - 21 mg/dL    Creatinine 0.49 0.38 - 1.02 mg/dL    Protein (Total) 5.0 (L) 6.0 - 8.2 g/dL    Albumin 2.9 (L) 3.5 - 5.2 g/dL    Bilirubin (Total) 0.2 0.2 - 1.3 mg/dL    Calcium 8.3 (L) 8.9 - 10.2 mg/dL    AST (GOT) 9 9 - 38 U/L    Alkaline Phosphatase (Total) 47 (  L) 49 - 199 U/L    ALT (GPT) 5 (L) 7 - 33 U/L    eGFR, Calculated >60 >59 mL/min/[1.73_m2]    GFR,  Information       Calculated GFR by CKD-EPI equation. Inaccurate with changing renal function. See [email or web address].   Calcium, Reflexive Ionized   Result Value Ref Range    Calcium (Ionized Reflex Status) Not indicated.    CBC   Result Value Ref Range    WBC 11.15 (H) 4.3 - 10.0 10*3/uL    RBC 3.84 3.80 - 5.00 10*6/uL    Hemoglobin 7.2 (L) 11.5 - 15.5 g/dL    Hematocrit 25 (L) 36 - 45 %    MCV 66 (L) 81 - 98 fL    MCH 18.8 (L) 27.3 - 33.6 pg    MCHC 28.5 (L) 32.2 - 36.5 g/dL    Platelet Count 395 150 - 400 10*3/uL    RDW-CV 18.4 (H) 11.6 - 14.4 %   Magnesium   Result Value Ref Range    Magnesium 1.9 1.8 - 2.4 mg/dL   Phosphate   Result Value Ref Range    Phosphate 3.3 2.5 - 4.5 mg/dL   Hemoglobin A1C, HPLC   Result Value Ref Range    Hemoglobin A1C 6.0 4.0 - 6.0 %   Iron, Serum   Result Value Ref Range    Iron, SRM <10 (L) 31 - 171 ug/dL   Hemoglobin A1C, HPLC   Result Value Ref Range    Hemoglobin A1C 6.1 (H) 4.0 - 6.0 %   T4 (Thyroxine), Free   Result Value Ref Range    Thyroxine (Free) 2.0 (H) 0.6 - 1.2 ng/dL   CBC   Result Value Ref Range    WBC 10.29 (H) 4.3 - 10.0 10*3/uL    RBC 4.77 3.80 - 5.00 10*6/uL    Hemoglobin 8.8 (L) 11.5 - 15.5 g/dL    Hematocrit 32 (L) 36 - 45 %    MCV 68 (L) 81 - 98 fL    MCH 18.4 (L) 27.3 - 33.6 pg    MCHC 27.2 (L) 32.2 - 36.5 g/dL    Platelet Count 499 (H) 150 - 400 10*3/uL    RDW-CV 18.6 (H) 11.6 - 14.4 %   Basic Metabolic Panel   Result Value Ref Range    Sodium 141 135 - 145 meq/L    Potassium 3.8 3.6 - 5.2 meq/L    Chloride 107 98 - 108 meq/L    Carbon Dioxide, Total 26 22 - 32 meq/L    Anion Gap 8 4 - 12    Glucose 136 (H) 62 - 125 mg/dL    Urea Nitrogen 6 (L) 8 - 21 mg/dL    Creatinine 0.48 0.38 - 1.02 mg/dL    Calcium 8.7 (L) 8.9 - 10.2 mg/dL    eGFR, Calculated >60 >59 mL/min/[1.73_m2]    GFR, Information       Calculated GFR by CKD-EPI equation. Inaccurate with changing renal function. See  [email or web address].   Lab Add On Order   Result Value Ref Range    Lab Test Requested CBC     Specimen Type/Description Blood     Sample To Use Most recent     Test Request Status Duplicate request    CBC   Result Value Ref Range    WBC 6.30 4.3 - 10.0 10*3/uL    RBC 4.41 3.80 - 5.00 10*6/uL    Hemoglobin 8.3 (L) 11.5 - 15.5 g/dL  Hematocrit 29 (L) 36 - 45 %    MCV 67 (L) 81 - 98 fL    MCH 18.8 (L) 27.3 - 33.6 pg    MCHC 28.2 (L) 32.2 - 36.5 g/dL    Platelet Count 494 (H) 150 - 400 10*3/uL    RDW-CV 18.8 (H) 11.6 - 14.4 %   Basic Metabolic Panel   Result Value Ref Range    Sodium 138 135 - 145 meq/L    Potassium 4.0 3.6 - 5.2 meq/L    Chloride 105 98 - 108 meq/L    Carbon Dioxide, Total 30 22 - 32 meq/L    Anion Gap 3 (L) 4 - 12    Glucose 100 62 - 125 mg/dL    Urea Nitrogen 8 8 - 21 mg/dL    Creatinine 0.52 0.38 - 1.02 mg/dL    Calcium 8.3 (L) 8.9 - 10.2 mg/dL    eGFR, Calculated >60 >59 mL/min/[1.73_m2]    GFR, Information       Calculated GFR by CKD-EPI equation. Inaccurate with changing renal function. See [email or web address].   COVID-19 Coronavirus Qualitative PCR   Result Value Ref Range    COVID-19 Coronavirus Qual PCR Specimen Type Nasal swab     COVID-19 Coronavirus Qual PCR Result None detected NDET    COVID-19 Coronavirus Qual PCR Interpretation       This is a negative result. Laboratory testing alone cannot rule out infection, particularly in the presence of clinical risk factors such as symptoms or exposure history.   Type and Screen   Result Value Ref Range    Units Ordered 2     Physician Instructions None     ABO/Rh O POSITIVE     Antibody Screen NEGATIVE     Crossmatch Expiration 04/19/2020     Product Unit Number [id number]     Blood Component Type       RED BLOOD CELLS_CP2D>AS3/516m/refg_Irradiated_ResLeu:<5E6    Unit Division 00     [facility]    Product Unit Number [id number]    Blood  Component Type       RED BLOOD CELLS_CP2D>AS3/5019mrefg_Irradiated_ResLeu:<5E6    Unit Division 00     [facility]  Blood Type Confirmation   Result Value Ref Range    ABO/Rh O POSITIVE    Pathology, Surgical   Result Value Ref Range    Final Diagnosis       A) Right colon and ileum, right hemicolectomy:   - Invasive 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  - See Comment and Synoptic report.    COMMENT:  This carcinoma shows extensive pushing fashion of invasion and intra/peritumor lymphocytes, suggestive of medullary features. In addition, the tumor shows patchy immunoreactivity for synaptophysin, in approximately 20% of the tumor volume; however, the neuroendocrine marker-positive tumor cells do not show distinct features of neuroendocrine carcinoma, so it is not classified as a mixed adenocarcinoma-neuroendocrine carcinoma.    A small population of the tumor cells also show loss of CDX2 expression. A focus of veinous (large vessel) invasion was identified and confirmed by elastin stain (Movat stain).     Reflexive MSI test will be performed by genetic lab, and the results will be reported in an addendum.      Synoptic Checklist       COLON AND RECTUM: Resection, Including Transanal Disk Excision of Rectal Neoplasms  (COLON AND RECTUM: RESECTION - A)  8th Edition - Protocol posted: 12/04/2018      SPECIMEN      Procedure:    Right hemicolectomy       TUMOR      Tumor Site:    Cecum       Histologic Type:    Adenocarcinoma       Histologic Grade:    G3: Poorly differentiated       Tumor Size:    Greatest dimension (Centimeters): 8 cm      Tumor Extension:    Tumor invades through the muscularis propria into pericolorectal tissue       Macroscopic Tumor Perforation:    Not identified       Lymphovascular Invasion:    Present         Lymphovascular Invasion Type:    Small vessel lymphovascular invasion         Lymphovascular  Invasion Type:    Large vessel (venous) invasion           :    Extramural       Perineural Invasion:    Not identified       Tumor Budding:    Number of tumor buds in one 'hotspot' field (total number in area = 0.785 mm2)         :    Low score (0-4)       Type of Polyp in Which Invasive Carcinoma Arose:    None identified       Treatment Effect:    No known presurgical therapy       MARGINS      Margins:            Proximal Margin:    Uninvolved by invasive carcinoma, high grade dysplasia / intramucosal carcinoma, and low grade dysplasia           Distance of Tumor from Margin:    18 cm        Distal Margin:    Uninvolved by invasive carcinoma, high grade dysplasia / intramucosal carcinoma, and low grade dysplasia           Distance of Tumor from Margin:    15 cm        Radial (circumferential) or Mesenteric Margin:    Uninvolved by invasive carcinoma           Distance of Tumor from Margin:    1.0 cm      LYMPH NODES      Number of Lymph Nodes Involved:    4       Number of Lymph Nodes Examined:    13       Tumor Deposits:    Present         Number of Deposits:    4       PATHOLOGIC STAGE CLASSIFICATION (pTNM, AJCC 8th Edition)            Primary Tumor (pT):    pT3       Regional Lymph Nodes (pN):    pN2a       Clinical Information       Colon cancer      Ancillary Studies (IHC, FISH and Special Stains report)       Slides Test Result   A4-2 Synaptophysin (MRQ-40) POSITIVE: Variably    A4-3 Chromogranin A (LK2H10+PHE5) NEGATIVE:     A4-4 CDX2 (Caudal type homeobox 2) (cdx2-88) POSITIVE: Loss of expression     All controls show appropriate  reactivity.      Intraoperative Consultation       A. Colon, Right.  Source: Colon, Right, 1. RIGHT COLON AND ILEUM  Collected 04/16/20 11:14 AM by Terpening, Zebedee Iba, RN    Intraoperative Consultation Diagnosis (Gross Examination Only): Proximal and distal margins are negative; the tumor is at least 5 cm to the margins.     Results called to: Dr. Saunders Revel MD at 11:36  AM, 04/16/20  Intraoperative Consultation  performed by: Kelton Pillar, MD  Assisted by: Ilsa Iha, MD        Gross Description       A. Colon, Right.  Received in formalin labeled, "Laible, Olivia Mcconnell; 1. RIGHT COLON AND ILEUM" is a segment of large bowel (23 cm length x 12 cm circumference) and small bowel (18 cm length by 5 cm circumference). No appendix is grossly identified. The ileal mesentery measures 10 x 3 cm and the colonic mesentery measures 16 cm by 7 cm (mesenteric margin is inked blue). There is a large, fungating cecal mass (8 x 7 cm) that measures 15 cm from the distal, 18 cm from the proximal, and 1 cm from the mesenteric margin.  There are 2 nodules (1.5 and 1.5 cm in maximum dimension) within the mesenteric adipose tissue and is within 1 mm of the mesenteric margin.  The serosal surface under the lesion is hard and nodular with serosal puckering (inked green) and two discrete 0.3 cm nodules are noted on the cecal serosal surface.  There are adhesions present between the ileum and cecum, however the mass does not seem to grossly infiltrate the ileum.  There are areas of tattooing around the cecal mass distal to the cecum. The remainder of the colonic mucosa and ileum mucosa is unremarkable. 25 lymph nodes are identified ranging from 0.2 to 3 cm. Photos were taken for documentation. (ABP)    A1: Distal margin   A2: Proximal margin  A3: Ileum and cecum  A4: Subserosal nodules  A5: Serosal puckering  A6: Possible ileocecal valve with tumor  A7-8: Contiguous sections of tumor with ileum  A9: Tumor nearest radial margin with 3 discrete mesenteric nodules  A10: Tumor with mesenteric nodules  A11-12: Mesenteric nodules near radial margin  A13: Three grossly positive lymph node  A14: One grossly positive lymph node  A15: Two whole lymph nodes  A16: 6 possible lymph nodes  A17: 6 possible lymph nodes  A18:7 possible lymph nodes          Case Report       Surgical Pathology Report                          Case: [id number]                                  Authorizing Provider:  Ralph Leyden, MD   Collected:           04/16/2020 1114              Ordering Location:     [facility]   Received:            04/16/2020 1122              Pathologist:           Paula Libra, MD  Intraop:               Kelton Pillar, MD                                                              Specimen:    Colon, Right, 1. RIGHT COLON AND ILEUM                                                     IHC, ISH and IF Disclaimer         Note: Some tests cited above may have been developed and their performance characteristics determined by the [facility]. They have not been cleared or approved by the U.S. Food and Drug Administration (FDA). However, the FDA has determined that such clearance or approval is not necessary. These tests are used for clinical purposes and should not be regarded as investigational or for research. The performing laboratories at [facility], [facility] or [facility] are certified under the [place] (CLIA '88) as qualified to perform high complexity laboratory testing.        LAB AP EMBEDDED IMAGES     POC Glucose, Whole Blood - [place]   Result Value Ref Range    Glucose (POC) 150 (H) 62 - 125 mg/dL   POC Glucose, Whole Blood - [place]   Result Value Ref Range    Glucose (POC) 160 (H) 62 - 125 mg/dL   POC Glucose, Whole Blood - [place]   Result Value Ref Range    Glucose (POC) 113 62 - 125 mg/dL   POC Glucose, Whole Blood - [place]   Result Value Ref Range    Glucose (POC) 149 (H) 62 - 125 mg/dL   POC Glucose, Whole Blood - [place]   Result Value Ref Range    Glucose (POC) 212 (H) 62 - 125 mg/dL   POC Glucose, Whole Blood - [place]   Result Value Ref Range    Glucose (POC) 205 (H) 62 - 125 mg/dL   POC Glucose, Whole Blood - [place]   Result Value Ref Range     Glucose (POC) 172 (H) 62 - 125 mg/dL   POC Glucose, Whole Blood - [place]   Result Value Ref Range    Glucose (POC) 139 (H) 62 - 125 mg/dL   POC Glucose, Whole Blood - [place]   Result Value Ref Range    Glucose (POC) 135 (H) 62 - 125 mg/dL   POC Glucose, Whole Blood - [place]   Result Value Ref Range    Glucose (POC) 132 (H) 62 - 125 mg/dL   POC Glucose, Whole Blood - [place]   Result Value Ref Range    Glucose (POC) 173 (H) 62 - 125 mg/dL   POC Glucose, Whole Blood - [place]   Result Value Ref Range    Glucose (POC) 172 (H) 62 - 125 mg/dL   POC Glucose, Whole Blood -  [place]   Result Value Ref Range    Glucose (POC) 234 (H) 62 - 125 mg/dL  POC Glucose, Whole Blood - [place]   Result Value Ref Range    Glucose (POC) 175 (H) 62 - 125 mg/dL   POC Glucose, Whole Blood - [place]   Result Value Ref Range    Glucose (POC) 212 (H) 62 - 125 mg/dL   POC Glucose, Whole Blood - [place]   Result Value Ref Range    Glucose (POC) 168 (H) 62 - 125 mg/dL   POC Glucose, Whole Blood - [place]   Result Value Ref Range    Glucose (POC) 143 (H) 62 - 125 mg/dL   POC Glucose, Whole Blood - [place]   Result Value Ref Range    Glucose (POC) 111 62 - 125 mg/dL   POC Glucose, Whole Blood - [place]   Result Value Ref Range    Glucose (POC) 176 (H) 62 - 125 mg/dL   POC Glucose, Whole Blood - [place]   Result Value Ref Range    Glucose (POC) 107 62 - 125 mg/dL   POC Glucose, Whole Blood - [place]   Result Value Ref Range    Glucose (POC) 118 62 - 125 mg/dL   POC Glucose, Whole Blood - [place]   Result Value Ref Range    Glucose (POC) 154 (H) 62 - 125 mg/dL   POC Glucose, Whole Blood - [place]   Result Value Ref Range    Glucose (POC) 102 62 - 125 mg/dL   POC Glucose, Whole Blood - [place]   Result Value Ref Range    Glucose (POC) 146 (H) 62 - 125 mg/dL   POC Glucose, Whole Blood - [place]   Result Value Ref Range    Glucose (POC) 130  (H) 62 - 125 mg/dL       ASSESSMENT/PLAN  Olivia Mcconnell is a 79 year old female with a history of ascending colon adenocarcinoma who is now s/p R hemicolectomy on 04/16/20. She returns to clinic for her post-operative visit.    1) Post-operative course  -Olivia Mcconnell is recovering well from her recent operation.  -Recommend remaining on a low residue diet for the next 2 weeks. At that time, okay to reintroduce fiber slowly until tolerating a normal diet.  -No lifting >10 lbs until 6 weeks post-op.  -Continue lovenox injections as prescribed.  -CBC to be completed today d/t dark stool. If H/H has dropped, will discuss role for a a blood transfusion.  -Continue antibiotics as prescribed.    2) pT3N2a colon cancer  -Patient met with Dr. Patrice Paradise today to discuss the role for adjuvant chemotherapy. She will notify [facility] of her decision regarding this.    This visit was conducted as a shared visit with Jolyn Nap, MD. Please see additional documentation by Dr. Windell [place] regarding patient's plan of care.

## 2020-05-03 NOTE — Progress Notes (Signed)
[facility] NOTE    ONCOLOGY CARE TEAM  Patient Care Team:  Olivia Boroughs, MD as Oncologist (Hematology/Oncology)   Dr. 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 Windell Norfolk) on 04/16/2020, here to discuss systemic therapy.     GENETICS/MOLECULAR:  MSI pending     CURRENT TREATMENT  [No treatment plan]    TREATMENT HISTORY  Oncology History Overview   04/01/2020: Colonoscopy done for iron deficiency anemia, shows obstructing cecal mass, path consistent with invasive adenocarcinoma   04/06/2020: CT chest/abdomen/pelvis shows primary cecal neoplasm measuring 7.3 x 6.7 x 5.7cm invading ileocecal valve and distal ileum, with transmural penetration of the tumor and local invasion into pericolonic fat. Local lymphadenopathy in the cecal mesentery and R ileocolonic mesentery, largest measuring 2.1 x 1.4 x 1.7cm. No distant metastatic disease.   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 PT3 pN2a             INTERVAL HISTORY  Olivia Mcconnell presents with her son Olivia Mcconnell to discuss adjuvant systemic therapy today. Since her discharge on 07/13, she has been at [facility] assisted [facility], where she was living prior to her admission. 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 Is now having about 1-2 formed bowel movements daily, and no longer having diarrhea. Bowel movements are described as "dark", without bright red blood.     She has been recovering well from her surgery, but is somewhat frustrated at her  recovery. She is able to walk to the dining hall for all of her meals, which is quite a long walk from her room. She takes a nap during the day, and has not yet returned to meetings or yoga, but feels that her energy is improving daily. Her appetite is normal, but she is able to eat less at a time than she usually did. Has had no nausea. Continues to take enoxaparin as recommended after surgery.     REVIEW OF SYSTEMS  A complete review of systems was completed and negative unless documented above in HPI    HOME MEDICATIONS    Current Outpatient Medications:   .  acetaminophen 500 MG tablet, Take 2 tablets (1,000 mg) by mouth every 6 hours as needed for pain or severe pain., Disp: 40 tablet, Rfl: 0  .  amLODIPine 10 MG tablet, Take 1 tablet (10 mg) by mouth daily., Disp: 7 tablet, Rfl: 0  .  enoxaparin 40 MG/0.4ML injection, Inject 0.4 mL (40 mg) under the skin once every 24 hours at bedtime for 23 days., Disp: 9.2 mL, Rfl: 0  .  Ferrous Sulfate (iron) 325 (65 Fe) MG tablet, Take 1 tablet (325 mg) by mouth daily., Disp: 7 tablet, Rfl: 0  .  levothyroxine 137 MCG tablet, Take 1 tablet (137 mcg) by mouth daily on an empty stomach., Disp: 7 tablet, Rfl: 0  .  polyethylene glycol 3350 17 GM/SCOOP oral powder, Take 17 g by mouth daily. 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 Never smoker, occasional alcohol     FAMILY HISTORY  Brother with kidney cancer   2 sisters with breast cancer     PHYSICAL EXAMINATION  VITAL SIGNS:   Vitals:    05/03/20 1406   BP: 125/77   Pulse: 73   Resp: 16    Temp: 36.9 C   SpO2: 99%     GENERAL: Comfortable appearing, very pleasant woman in no acute distress  CHEST: Normal work of breathing   NEURO: Appropriate gait. Moving all extremities spontaneously   MSK: Normal bulk and tone in all extremities  SKIN: No apparent rashes or lesions    ECOG 2 currently due to post-operative recovery.     LABORATORY STUDIES  CBC:  Lab Results   Component Value Date/Time    WBC 6.30 04/19/2020 07:40 AM    HEMOGLOBIN 8.3 (L) 04/19/2020 07:40 AM    HEMATOCRIT 29 (L) 04/19/2020 07:40 AM    PLATELET 494 (H) 04/19/2020 07:40 AM    ANEUT 4.74 11/02/2016 98:11 PM     Metabolic panel:  Lab Results   Component Value Date/Time    POTASSIUM 4.0 04/19/2020 07:40 AM    CL 105 04/19/2020 07:40 AM    CO2 30 04/19/2020 07:40 AM    BUN 8 04/19/2020 07:40 AM    CREATININE 0.52 04/19/2020 07:40 AM    AST 9 04/17/2020 06:20 AM    ALT 5 (L) 04/17/2020 06:20 AM    ALK 47 (L) 04/17/2020 06:20 AM    BILIRUBN 0.2 04/17/2020 06:20 AM    ALBUMIN 2.9 (L) 04/17/2020 06:20 AM    PROTEIN 5.0 (L) 04/17/2020 06:20 AM       IMAGING:    CT chest/abdomen/pelvis 04/06/2020:  Result Impression   1. 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  2. Local neoplastic lymphadenopathy in the cecal mesentery and the right ileocolonic mesentery, the largest local neoplastic lymph node is 21 x 14 x 17 mm.  3. No additional abdominal or pelvic metastatic disease, including no intrahepatic metastasis.  4. A few very tiny bilateral pulmonary nodules, which are more likely to represent residua of prior inflammatory disease rather than pulmonary metastases.  5. Pelvic floor laxity, with subsequent moderate 3 cm inferior descent of intrapelvic structures.  6. Cholelithiasis.  7. Moderate diverticulosis     PATHOLOGY:  R hemicolectomy 04/16/2020:     Right colon and ileum, right hemicolectomy:                 - Invasive 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    This carcinoma shows extensive pushing fashion of invasion and intra/peritumor lymphocytes, suggestive of medullary features. In addition, the tumor shows patchy immunoreactivity for synaptophysin, in approximately 20% of the tumor volume; however, the neuroendocrine marker-positive tumor cells do not show distinct features of neuroendocrine carcinoma, so it is not classified as a mixed adenocarcinoma-neuroendocrine carcinoma.  A small population of the tumor cells  also show loss of CDX2 expression. A focus of veinous (large vessel) invasion was identified and confirmed by elastin stain (Movat stain).   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 Windell Norfolk) on 04/16/2020, here to discuss systemic therapy.     #Stage IIIa colon adenocarcinoma with medullary features--Final pathologic staging pT3N2a, representing stage IIIa disease. We discussed management thus far and pathologic findings including staging with Olivia Mcconnell and her son Olivia Mcconnell today. Due to the higher risk of recurrence with this stage of disease, we discussed reducing risk of recurrence with adjuvant systemic therapy today. She is still recovering from her recent cancer surgery though with time, her performance status is expected to improve to a level to tolerate chemotherapy. We discussed our recommendation for 3-6 months of adjuvant chemotherapy in detail today, either with capeox (capecitabine D1-14, oxaliplatin D1, q21 days) or FOLFOX (5-FU D1 with pump disconnect D3, oxaliplatin D1, q14 days). A port would be required with FOLFOX. We discussed side  effects in detail, including the risks of fatigue, hand/foot syndrome, and neuropathy, which tends to be worse with capeox. We also briefly discussed the pending MSI status, which may change management. If MSI high, immunotherapy could be considered along with chemotherapy on a trial, or could be considered first line in the metastatic setting. Currently, Olivia Mcconnell is unsure if she would like to proceed with chemotherapy, and would like to wait to make a decision until MSI results return which is reasonable. In regards to time frame, ideally chemotherapy would begin 4-8 weeks after surgery.   -Will think about our recommendation for adjuvant chemotherapy and let us know her decision. Placed orders for FOLFOX to start 08/22 in case.     #Post-operative care--Will be seeing surgical oncology today for post-surgical follow up     FOLLOW UP  Will call with chemotherapy decision.     Patient was seen and discussed with attending oncologist Dr. Patrice Paradise.     Serita Sheller, MD  Fellow, Hematology/Oncology   [facility]

## 2020-05-04 ENCOUNTER — Telehealth (HOSPITAL_BASED_OUTPATIENT_CLINIC_OR_DEPARTMENT_OTHER): Payer: Self-pay | Admitting: Surgery

## 2020-05-04 ENCOUNTER — Ambulatory Visit (HOSPITAL_BASED_OUTPATIENT_CLINIC_OR_DEPARTMENT_OTHER): Payer: Medicare Other | Admitting: Hematology & Oncology

## 2020-05-04 NOTE — Addendum Note (Signed)
Addended by: Jennye Boroughs on: 05/04/2020 08:11 AM     Modules accepted: Orders, Level of Service

## 2020-05-04 NOTE — Telephone Encounter (Signed)
Oncology Telephone Triage Note      Subjective / Visit Information:    Reason for Call: Results (Review CBC results)           Assessment and Plan:     Call Summary: Per Hughie Closs, PA-C request to call Pt and review CBC lab results are normal and Pt does not need to have a blood transfusion.    Pt's son verbalized understanding and denied any other questions or concerns.    Action Taken: Provider Notified          Response: Aware of clinic contact information, States understanding of plan, Restates plan and No further questions      Additional documentation may exist in Flowsheets or Education Activity

## 2020-05-07 ENCOUNTER — Telehealth (HOSPITAL_BASED_OUTPATIENT_CLINIC_OR_DEPARTMENT_OTHER): Payer: Self-pay

## 2020-05-07 LAB — MICROSATELLITE INSTABILITY BTY: MSI Result: NEGATIVE

## 2020-05-07 NOTE — Telephone Encounter (Signed)
-----  Message from Jennye Boroughs, MD sent at 05/07/2020  2:02 PM PDT -----  Regarding: MSI result  Hi RN team,     Please let patient know that her tumor testing came back as MSS (NOT MSI as expected)  Thus, we do not have immunotherapy as an option on relapse    Accordingly, I would list her options as:  1. Start surveillance now (no chemo)  2. Capecitabine monotherapy  3. FOLFOX/CAPOX combination chemotherapy    I had not really discussed #2 because we do no use that in MSI CRC. However, given her age, health, and other factors, this is my suggestion at this time if she is interested in any kind of chemo. ~2/3 of the benefit from chemo is due to the capecitabine (5-FU) and there are less side effects with 1 drug vs. 2    She is not on MyChart, but we can do a follow up call or visit to discuss more if she would like    Thanks!  Erline Levine

## 2020-05-07 NOTE — Telephone Encounter (Signed)
I spoke with pt's son Olivia Mcconnell. He wasn't clear on the message I had relayed from Olivia Mcconnell and feels like it sounds different than what had been discussed during the clinic visit appt. I tried to explain in more detail based off of Olivia Mcconnell plan per clinic visit note, but Olivia Mcconnell wasn't clear from our discussion.     He requested to have this plan written down for his review and I also offered to send along the assessment/plan per Olivia Mcconnell visit note to see if this helps clarify the recommendations relayed by Olivia Mcconnell. He would appreciate this.     He thought pt was active on my chart and asked for message to be sent that way, but I informed him that status was pending and it doesn't show her to be active on my chart. Olivia Mcconnell gave me his email, so will send message this way with his knowledge that it's not a secure way to send messages.    After he reads the email, he will respond with further questions to help clarify next steps and decision making for options outlined by Olivia Mcconnell.

## 2020-05-15 ENCOUNTER — Encounter (HOSPITAL_BASED_OUTPATIENT_CLINIC_OR_DEPARTMENT_OTHER): Payer: Self-pay | Admitting: Endocrinology

## 2020-05-16 NOTE — Progress Notes (Signed)
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 Windell Norfolk) on 04/16/2020, here to discuss systemic therapy.    GENETICS/MOLECULAR:  Microsatellite stable    CURRENT TREATMENT  Treatment goal Control   Plan Name OP FOLFOX ( Fluorouracil / Leucovorin / OXALIplatin ) Every 14 Days   Start Date 05/31/2020 (Planned)   End Date 07/14/2020 (Planned)     TREATMENT HISTORY  Oncology History Overview   04/01/2020: Colonoscopy done for iron deficiency anemia, shows obstructing cecal mass, path consistent with invasive adenocarcinoma   04/06/2020: CT chest/abdomen/pelvis shows primary cecal neoplasm measuring 7.3 x 6.7 x 5.7cm invading ileocecal valve and distal ileum, with transmural penetration of the tumor and local invasion into pericolonic fat. Local lymphadenopathy in the cecal mesentery and R ileocolonic mesentery, largest measuring 2.1 x 1.4 x 1.7cm. No distant metastatic disease.   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 PT3 pN2a             INTERVAL HISTORY  Olivia Mcconnell presents with her son Merry Proud to discuss further systemic therapy after her molecular testing returned as MSS. She has been doing well since our last visit, and continues to improve in terms of mobility and energy. She is eating normal meals again and no longer feels early satiety. She has been hesitant to get back into the pool or  participate in yoga/pilates classes at her living facility due to concerns about her surgical scars.     REVIEW OF SYSTEMS  A complete review of systems was completed and negative unless documented above in HPI    HOME MEDICATIONS    Current Outpatient Medications:   .  acetaminophen 500 MG tablet, Take 2 tablets (1,000 mg) by mouth every 6 hours as needed for pain or severe pain., Disp: 40 tablet, Rfl: 0  .  amLODIPine 10 MG tablet, Take 1 tablet (10 mg) by mouth daily., Disp: 7 tablet, Rfl: 0  .  Ferrous Sulfate (iron) 325 (65 Fe) MG tablet, Take 1 tablet (325 mg) by mouth daily., Disp: 7 tablet, Rfl: 0  .  levothyroxine 137 MCG tablet, Take 1 tablet (137 mcg) by mouth daily on an empty stomach., Disp: 7 tablet, Rfl: 0  .  polyethylene glycol 3350 17 GM/SCOOP oral powder, Take 17 g by mouth daily. 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 Never smoker, occasional alcohol     FAMILY HISTORY  Brother with kidney cancer   2  sisters with breast cancer     PHYSICAL EXAMINATION  VITAL SIGNS:   Vitals:    05/17/20 1636   BP: (!) 140/83   Pulse: 80   Resp: 18   Temp: 37.1 C   SpO2: 97%     GENERAL: Comfortable appearing, very pleasant woman in no acute distress  CHEST: Normal work of breathing   ABD: Soft, nontender, nondistended. Well healed laparoscopy incisions   NEURO: Appropriate gait. Moving all extremities spontaneously   MSK: Normal bulk and tone in all extremities  SKIN: No apparent rashes or lesions    LABORATORY STUDIES  CBC:  Lab Results   Component Value Date/Time    WBC  6.38 05/03/2020 04:17 PM    HEMOGLOBIN 10.5 (L) 05/03/2020 04:17 PM    HEMATOCRIT 36 05/03/2020 04:17 PM    PLATELET 346 05/03/2020 04:17 PM    ANEUT 3.85 05/03/2020 32:44 PM     Metabolic panel:  Lab Results   Component Value Date/Time    POTASSIUM 4.0 04/19/2020 07:40 AM    CL 105 04/19/2020 07:40 AM    CO2 30 04/19/2020 07:40 AM    BUN 8 04/19/2020 07:40 AM    CREATININE 0.52 04/19/2020 07:40 AM    AST 9 04/17/2020 06:20 AM    ALT 5 (L) 04/17/2020 06:20 AM    ALK 47 (L) 04/17/2020 06:20 AM    BILIRUBN 0.2 04/17/2020 06:20 AM    ALBUMIN 2.9 (L) 04/17/2020 06:20 AM    PROTEIN 5.0 (L) 04/17/2020 06:20 AM     ASSESSMENT/PLAN  Olivia Mcconnell is a 79 year old female with pT3N2a (stage IIIa), 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 Windell Norfolk) on 04/16/2020, here to discuss systemic therapy    #Stage IIIa colon adenocarcinoma with medullary features--At our last visit, we discussed systemic therapy to reduce recurrence risk in the context of stage III colon cancer, with the expectation that her disease would be microsatellite unstable given the medullary features on pathology. Her molecular testing has now returned as microsatellite stable, which provides [place] with more options for therapy. We again discussed our goal for adjuvant systemic therapy as reduction in risk of recurrence. We discussed our options as 1 drug options, or 2 drug options, and reviewed data that suggests patients over the age of 48 do not benefit from the addition of oxaliplatin to a fluoropyrimidine such as 5-FU or capecitabine. With stage III cancer in particular, capecitabine tends to lead to the most risk reduction of any single agent.      As her disease is microsatellite stable, monotherapy with capecitabine is now an option for adjuvant systemic therapy. Olivia Mcconnell's overall goal is to extend her life for as long as possible, without feeling  the toxic effects of chemotherapy. Knowing this, we shared our recommendation for capecitabine monotherapy for 3-6 months (ideally 6 if tolerated). We discussed side effects and schedules for all options today. Capecitabine alone would require taking cape pills twice daily on D1-14 of a 21 day cycle, with visits on D1 of each cycle for lab draws. FOLFOX would require 5-FU pump placement on D1, removal on D3, with oxaliplatin infusion on D1 q2 weeks x 6 months. CapeOx would require cape pills on D1-14 of a 21 day cycle, with oxaliplatin given on D1 of a 21 day cycle. Side effects of capecitabine include fatigue, cytopenias, hand/foot syndrome, and rarely, enteritis. Oxaliplatin side effects include cold sensitivity, neuropathy,  and diarrhea, and 5-FU includes neuropathy, fatigue, decrease in blood counts, nausea/vomiting. After this discussion, Olivia Mcconnell remains unsure about moving forward with chemotherapy but is willing to trial it for a time. If side effects are not tolerable, we can always dose reduce or stop.   -Capecitabine '1000mg'$ /m2 BID D1-14 of a 21 day cycle, orders placed, with plans for 3-6 months of total treatment. Low threshold to dose reduce if toxicities arise.   -Discontinued prior FOLFOX orders     #Post-operative care--With well-healed surgical scars. Encouraged Olivia Mcconnell to resume her activities such as swimming and yoga if she feels comfortable     FOLLOW UP  Return to clinic in 1 week for cape start     Patient was seen and discussed with attending oncologist Dr. Malka So, MD  Fellow, Hematology/Oncology   [facility] [place]

## 2020-05-17 ENCOUNTER — Ambulatory Visit: Payer: Medicare Other | Attending: Hematology & Oncology | Admitting: Hematology & Oncology

## 2020-05-17 VITALS — BP 140/83 | HR 80 | Temp 98.8°F | Resp 18 | Wt 137.3 lb

## 2020-05-17 DIAGNOSIS — C182 Malignant neoplasm of ascending colon: Secondary | ICD-10-CM | POA: Insufficient documentation

## 2020-05-17 DIAGNOSIS — Z08 Encounter for follow-up examination after completed treatment for malignant neoplasm: Secondary | ICD-10-CM | POA: Insufficient documentation

## 2020-05-17 DIAGNOSIS — Z85850 Personal history of malignant neoplasm of thyroid: Secondary | ICD-10-CM | POA: Insufficient documentation

## 2020-05-17 MED ORDER — CAPECITABINE 500 MG OR TABS
1000.0000 mg/m2 | ORAL_TABLET | Freq: Two times a day (BID) | ORAL | 3 refills | Status: DC
Start: 2020-05-17 — End: 2020-05-20
  Filled 2020-05-19: qty 84, 21d supply, fill #0

## 2020-05-18 ENCOUNTER — Other Ambulatory Visit (HOSPITAL_BASED_OUTPATIENT_CLINIC_OR_DEPARTMENT_OTHER): Payer: Self-pay | Admitting: Pharmacist

## 2020-05-18 NOTE — Progress Notes (Signed)
I saw and evaluated the patient. I have reviewed the resident's documentation and agree with it     We had a long discussion about adjuvant therapy now that her cancer has been determined to be MSS. Based on the [place], her age, her goals, and her health, I recommended capecitabine monotherapy for adjuvant treatment x6 mo. She is planning this, though still struggles with the decision. I confirmed that she can change her mind at any time

## 2020-05-18 NOTE — Addendum Note (Signed)
Addended by: Jennye Boroughs on: 05/18/2020 08:04 AM     Modules accepted: Level of Service

## 2020-05-18 NOTE — Progress Notes (Signed)
[facility] General Oncology Clinical Pharmacist ORAL Chemotherapy Verification     Indication: Cancer of ascending colon ([place]), Stage III     Treatment Plan: OP Capecitabine (Monotherapy)      Dose: Capecitabine 1500 mg PO BID on Days 1 to 14 every 21 days  Dose rounded per MD for patient age.    Cycle: Initial review (New Rx)   Adjuvant, 6 months planned     Monitoring:   CLcr ~ 60 mL/min (BW = 62.3 kg, Scr rounded to 0.7 mg/dL). No dose adjustment recommended for renal function.  Hgb     DDIs: Reviewed. Identified the following DDIs:  Recommend holding Saccharomyces boulardii probiotic while on chemotherapy due to risk of infection while neutropenic (obtained from outside medical record, prescribed in s/o C. difficile infection?    Other:   Holding oxaliplatin for age, patient preference to limit toxicity    Reference:   NCCN Guidelines. "Colon Cancer." Ver 2.2021.    Dwyane Luo, PharmD

## 2020-05-19 ENCOUNTER — Other Ambulatory Visit (HOSPITAL_BASED_OUTPATIENT_CLINIC_OR_DEPARTMENT_OTHER): Payer: Self-pay

## 2020-05-19 ENCOUNTER — Other Ambulatory Visit (HOSPITAL_BASED_OUTPATIENT_CLINIC_OR_DEPARTMENT_OTHER): Payer: Self-pay | Admitting: Hematology & Oncology

## 2020-05-19 DIAGNOSIS — C182 Malignant neoplasm of ascending colon: Secondary | ICD-10-CM

## 2020-05-19 NOTE — Progress Notes (Signed)
Accidentally opened encounter.

## 2020-05-20 ENCOUNTER — Other Ambulatory Visit (HOSPITAL_BASED_OUTPATIENT_CLINIC_OR_DEPARTMENT_OTHER): Payer: Self-pay | Admitting: Pharmacist

## 2020-05-20 ENCOUNTER — Telehealth (HOSPITAL_BASED_OUTPATIENT_CLINIC_OR_DEPARTMENT_OTHER): Payer: Self-pay

## 2020-05-20 DIAGNOSIS — C182 Malignant neoplasm of ascending colon: Secondary | ICD-10-CM

## 2020-05-20 MED ORDER — CAPECITABINE 500 MG OR TABS
1000.0000 mg/m2 | ORAL_TABLET | Freq: Two times a day (BID) | ORAL | 3 refills | Status: DC
Start: 2020-05-20 — End: 2020-05-20

## 2020-05-20 MED ORDER — CAPECITABINE 500 MG OR TABS
1000.0000 mg/m2 | ORAL_TABLET | Freq: Two times a day (BID) | ORAL | 3 refills | Status: DC
Start: 2020-05-20 — End: 2020-05-21

## 2020-05-20 NOTE — Telephone Encounter (Signed)
-----   Message from Cecilie Kicks, RN sent at 05/19/2020 11:26 AM PDT -----  Regarding: RE: capecitabine  Lauren, could you let me know which [facility] to use, there are many choices.  ----- Message -----  From: Jennye Boroughs, MD  Sent: 05/19/2020  11:21 AM PDT  To: Cecilie Kicks, RN, Elmer Picker, #  Subject: RE: capecitabine                                 Please add the pharmacy and I can route there  I still see the script as active  ----- Message -----  From: Cecilie Kicks, RN  Sent: 05/19/2020  11:16 AM PDT  To: Jennye Boroughs, MD, Elmer Picker, #  Subject: RE: capecitabine                                 The initial referral said [facility] for dispensing so that's where we routed it.    Erline Levine, you now have to re-enter the script so it can be sent to [facility].    Thanks,  Alvis Lemmings  ----- Message -----  From: Jennye Boroughs, MD  Sent: 05/19/2020  10:56 AM PDT  To: Elmer Picker, #  Subject: RE: capecitabine                                 Adding the RNs who can hopefully reroute it  ----- Message -----  From: Elmer Picker  Sent: 05/19/2020  10:45 AM PDT  To: Jennye Boroughs, MD  Subject: capecitabine                                     Good morning,    This patients insurance requires they fill the capecitabine at [facility]. Would you be able to resend this there?     The phone number is [phone].    Thanks!

## 2020-05-20 NOTE — Telephone Encounter (Signed)
Sending message to provider that even though rx is being shown as active, it had already been routed to [facility] and will require entering a new rx so it can be routed to correct disp. pharmacy.    Spoke with clinical pharmacist who will enter new rx. Will still need provider/attending to sign then release this new rx for dispensing to [facility].    Will send message to provider to perform above steps.

## 2020-05-21 ENCOUNTER — Encounter (HOSPITAL_BASED_OUTPATIENT_CLINIC_OR_DEPARTMENT_OTHER): Payer: Medicare Other | Admitting: Surgery

## 2020-05-21 ENCOUNTER — Other Ambulatory Visit (HOSPITAL_BASED_OUTPATIENT_CLINIC_OR_DEPARTMENT_OTHER): Payer: Self-pay | Admitting: Nurse Practitioner

## 2020-05-21 ENCOUNTER — Other Ambulatory Visit (HOSPITAL_BASED_OUTPATIENT_CLINIC_OR_DEPARTMENT_OTHER): Payer: Self-pay

## 2020-05-21 DIAGNOSIS — C182 Malignant neoplasm of ascending colon: Secondary | ICD-10-CM

## 2020-05-21 MED ORDER — CAPECITABINE 500 MG OR TABS
1000.0000 mg/m2 | ORAL_TABLET | Freq: Two times a day (BID) | ORAL | 3 refills | Status: DC
Start: 2020-05-21 — End: 2020-08-04
  Filled 2020-05-24: qty 84, 21d supply, fill #0
  Filled 2020-06-21: qty 84, 21d supply, fill #1
  Filled 2020-07-12: qty 84, 21d supply, fill #2
  Filled 2020-08-03: qty 84, 21d supply, fill #3

## 2020-05-21 MED ORDER — CAPECITABINE 500 MG OR TABS
1000.0000 mg/m2 | ORAL_TABLET | Freq: Two times a day (BID) | ORAL | 3 refills | Status: DC
Start: 2020-05-21 — End: 2020-05-21

## 2020-05-21 NOTE — Addendum Note (Signed)
Addended by: Jennye Boroughs on: 05/21/2020 09:27 PM     Modules accepted: Orders

## 2020-05-24 ENCOUNTER — Other Ambulatory Visit (HOSPITAL_BASED_OUTPATIENT_CLINIC_OR_DEPARTMENT_OTHER): Payer: Self-pay

## 2020-05-28 NOTE — Progress Notes (Signed)
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 She presents for clinical evaluation prior to starting capecitabine monotherapy.    CURRENT TREATMENT  Treatment goal Curative   Plan Name OP Capecitabine (Monotherapy)   Start Date 06/01/2020 (Planned)   End Date 08/03/2020 (Planned)       ONCOLOGIC HISTORY  Oncology History Overview   04/01/2020: Colonoscopy done for iron deficiency anemia, shows obstructing cecal mass, path consistent with invasive adenocarcinoma   04/06/2020: CT chest/abdomen/pelvis shows primary cecal neoplasm measuring 7.3 x 6.7 x 5.7cm invading ileocecal valve and distal ileum, with transmural penetration of the tumor and local invasion into pericolonic fat. Local lymphadenopathy in the cecal mesentery and R ileocolonic mesentery, largest measuring 2.1 x 1.4 x 1.7cm. No distant metastatic disease.   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 PT3 pN2a              INTERVAL EVENTS  Here today with her son. Wants the covid booster shot. Is worried about starting chemo because she doesn't want it to interfere with the covid booster.  Doesn't walk much. Lives at [place].   Healing well.  No pain. Sleeping ok.     ROS  Denies fever, chills.  Denies mucositis.  Denies changes in appetite, nausea, vomiting, constipation, or diarrhea.  Denies dysuria, blood in urine or stool.  Denies neuropathy.  Denies rash or skin changes.  Denies chest pain,  cough, shortness of breath, or edema.  Complete review of systems negative except as noted in HPI/interval history.    PAST MEDICAL HISTORY  Past Medical History:   Diagnosis Date   . Thyroid cancer ([place]) 2011   . Breast mass 1990    Removed   . Anemia     [place]   . Colon cancer [facility])      Past Surgical History:   Procedure Laterality Date   . COLONOSCOPY  04/01/2020   . PR APPENDECTOMY  1990s   . PR UNLISTED PROCEDURE BREAST Right 1990   . THYROID SURGERY  2011    total thyroidectomy   . TOTAL KNEE ARTHROPLASTY Left 09/2014        FAMILY HISTORY  Family History     Problem (# of Occurrences) Relation (Name,Age of Onset)    Breast Cancer (1) Sister    Kidney Cancer (1) Brother    Rheumatoid Arthritis (1) Father          SOCIAL HISTORY  Lives in [place].     ALLERGIES  Review of patient's allergies indicates:  Allergies   Allergen Reactions   . Ciprofloxacin Unknown   . Sulfa Antibiotics Unknown       HOME MEDICATIONS   Current Outpatient Medications:   .  acetaminophen 500 MG tablet, Take 2 tablets (1,000 mg) by mouth every 6 hours as needed for pain or severe pain., Disp: 40 tablet, Rfl: 0  .  amLODIPine 10 MG tablet, Take 1 tablet (10 mg) by mouth daily., Disp: 7 tablet, Rfl: 0  .  capecitabine 500 MG tablet, Take 3 tablets (1,500 mg) by mouth 2 times a day. Take on days 1 to 14 of each 21 day cycle. Take within 30 mins after food. Do not take first dose until directed by clinical team., Disp: 84 tablet, Rfl: 3  .  Ferrous Sulfate (iron) 325 (65 Fe) MG tablet, Take 1 tablet (325 mg) by mouth daily., Disp: 7 tablet, Rfl: 0  .  levothyroxine 137 MCG tablet, Take 1 tablet (137 mcg) by mouth daily on an empty stomach., Disp: 7 tablet, Rfl: 0  .  loperamide 2 MG tablet, Take 2 tablets by mouth with first episode of diarrhea, then 1 tablet with every loose stool until no diarrhea for 12 hours. Max 8 tablets daily., Disp: 60 tablet, Rfl: 3  .  ondansetron 8 MG tablet, Take 1 tablet (8 mg) by mouth every 8 hours as  needed for nausea/vomiting., Disp: 60 tablet, Rfl: 11  .  prochlorperazine 10 MG tablet, Take 1 tablet (10 mg) by mouth every 6 hours as needed for nausea/vomiting., Disp: 60 tablet, Rfl: 11       ECOG PERFORMANCE STATUS  ECOG: (2) Ambulatory and capable of self care, unable to carry out work activity, up and about > 50% or waking hours     PHYSICAL EXAMINATION  VITALS: see flowsheet  GENERAL: In no acute distress.  HEENT: Normocephalic, atraumatic, sclera anicteric and without erythema, without nasal drainage, oropharynx moist and clear without erythema, dentition intact.   RESPIRATORY: Lungs clear to auscultation bilaterally anteriorly and posteriorly with no wheezing, crackles, or rhonchi.  CARDIOVASCULAR:  Regular rate and rhythm. No murmurs, gallops or rubs. Warm, well perfused, no cyanosis. No lower extremity edema.  ABDOMEN:  Soft, nontender, nondistended. No rebound tenderness or guarding. Bowel sounds normoactive.  NEURO:  Alert and oriented x3. No focal neurological deficits.  MSK: Normal gait/stance. Ambulates independently.   SKIN:  No rash, jaundice, petechia, bruising, or ulcer.  PSYCH:  Appropriate affect.    LABORATORY STUDIES  Lab on 06/01/20   1. CBC, Abs Neutrophil   Result Value Ref Range    WBC 6.29 4.3 - 10.0 10*3/uL    RBC 5.30 (H) 3.80 - 5.00 10*6/uL    Hemoglobin 12.3 11.5 - 15.5 g/dL    Hematocrit 40 36 - 45 %    MCV 75 (L) 81 - 98 fL    MCH 23.2 (L) 27.3 - 33.6 pg    MCHC 31.1 (L) 32.2 - 36.5 g/dL    Platelet Count 297 150 - 400 10*3/uL    RDW-CV 24.1 (H) 11.6 - 14.4 %    Absolute Neutrophil Comment Comment not required     Neutrophils 3.76 1.80 - 7.00 10*3/uL    Immature Granulocytes 0.01 0.00 - 0.05 10*3/uL    RBC Morphology See RBC data     Platelet Morphology See PLT count     WBC Morphology See Diff    2. Comprehensive Metabolic Panel   Result Value Ref Range    Sodium 138 135 - 145 meq/L    Potassium 3.8 3.6 - 5.2 meq/L    Chloride 104 98 - 108 meq/L    Carbon Dioxide, Total 27 22 -  32 meq/L    Anion Gap 7 4 - 12    Glucose 129 (H) 62 - 125 mg/dL    Urea Nitrogen 15 8 - 21 mg/dL    Creatinine 0.56 0.38 - 1.02 mg/dL    Protein (Total) 7.0 6.0 -  8.2 g/dL    Albumin 4.2 3.5 - 5.2 g/dL    Bilirubin (Total) 0.4 0.2 - 1.3 mg/dL    Calcium 9.6 8.9 - 10.2 mg/dL    AST (GOT) 13 9 - 38 U/L    Alkaline Phosphatase (Total) 94 49 - 199 U/L    ALT (GPT) 15 7 - 33 U/L    eGFR, Calculated >60 >59 mL/min/[1.73_m2]    GFR, Information       Calculated GFR by CKD-EPI equation. Inaccurate with changing renal function. See http://depts.[email or web address].html.       Recent Labs     04/09/20  1611   CEA 2.5       IMAGING  No new imaging.     PLAN:  1) Colon cancer.    *Treatment. Based on history, labs, and physical exam, the patient is appropriate for treatment today. Will give C 1 D 1 of capecitabine today.  - plan for 6 months of treatment  - capecitabine dose 1571m BID  - discussed treatment cadence, side effects, and managing side effects with antiemetics and anti-diarrheals as needed    2) Vaccines. Ok to get Covid19 booster vaccine when available.     3) Microcytosis without anemia. History of iron deficiency. Her PCP is managing this. She takes oral iron daily.     Return to clinic in 3 weeks. Patient instructed to call clinic in the interim with questions or concerns.

## 2020-06-01 ENCOUNTER — Ambulatory Visit: Payer: Medicare Other | Attending: Hematology & Oncology

## 2020-06-01 ENCOUNTER — Ambulatory Visit (HOSPITAL_BASED_OUTPATIENT_CLINIC_OR_DEPARTMENT_OTHER): Payer: Medicare Other | Admitting: Nurse Practitioner

## 2020-06-01 ENCOUNTER — Ambulatory Visit (HOSPITAL_BASED_OUTPATIENT_CLINIC_OR_DEPARTMENT_OTHER): Payer: Medicare Other

## 2020-06-01 ENCOUNTER — Encounter (HOSPITAL_COMMUNITY): Payer: Self-pay

## 2020-06-01 ENCOUNTER — Other Ambulatory Visit (HOSPITAL_BASED_OUTPATIENT_CLINIC_OR_DEPARTMENT_OTHER): Payer: Self-pay

## 2020-06-01 VITALS — BP 144/75 | HR 61 | Temp 98.6°F | Resp 16 | Wt 140.2 lb

## 2020-06-01 DIAGNOSIS — R718 Other abnormality of red blood cells: Secondary | ICD-10-CM | POA: Insufficient documentation

## 2020-06-01 DIAGNOSIS — C182 Malignant neoplasm of ascending colon: Secondary | ICD-10-CM

## 2020-06-01 LAB — COMPREHENSIVE METABOLIC PANEL
ALT (GPT): 15 U/L (ref 7–33)
AST (GOT): 13 U/L (ref 9–38)
Albumin: 4.2 g/dL (ref 3.5–5.2)
Alkaline Phosphatase (Total): 94 U/L (ref 49–199)
Anion Gap: 7 (ref 4–12)
Bilirubin (Total): 0.4 mg/dL (ref 0.2–1.3)
Calcium: 9.6 mg/dL (ref 8.9–10.2)
Carbon Dioxide, Total: 27 meq/L (ref 22–32)
Chloride: 104 meq/L (ref 98–108)
Creatinine: 0.56 mg/dL (ref 0.38–1.02)
Glucose: 129 mg/dL — ABNORMAL HIGH (ref 62–125)
Potassium: 3.8 meq/L (ref 3.6–5.2)
Protein (Total): 7 g/dL (ref 6.0–8.2)
Sodium: 138 meq/L (ref 135–145)
Urea Nitrogen: 15 mg/dL (ref 8–21)
eGFR by CKD-EPI: >60 mL/min/{1.73_m2} (ref >59)

## 2020-06-01 LAB — CBC, ABS NEUTROPHIL
Hematocrit: 40 % (ref 36–45)
Hemoglobin: 12.3 g/dL (ref 11.5–15.5)
Immature Granulocytes: 0.01 10*3/uL (ref 0.00–0.05)
MCH: 23.2 pg — ABNORMAL LOW (ref 27.3–33.6)
MCHC: 31.1 g/dL — ABNORMAL LOW (ref 32.2–36.5)
MCV: 75 fL — ABNORMAL LOW (ref 81–98)
Neutrophils: 3.76 10*3/uL (ref 1.80–7.00)
Platelet Count: 297 10*3/uL (ref 150–400)
RBC: 5.3 10*6/uL — ABNORMAL HIGH (ref 3.80–5.00)
RDW-CV: 24.1 % — ABNORMAL HIGH (ref 11.6–14.4)
WBC: 6.29 10*3/uL (ref 4.3–10.0)

## 2020-06-01 MED ORDER — PROCHLORPERAZINE MALEATE 10 MG OR TABS
10.0000 mg | ORAL_TABLET | Freq: Four times a day (QID) | ORAL | 11 refills | Status: DC | PRN
Start: 2020-06-01 — End: 2021-01-31
  Filled 2020-06-01: qty 60, 15d supply, fill #0

## 2020-06-01 MED ORDER — ONDANSETRON HCL 8 MG OR TABS
8.0000 mg | ORAL_TABLET | Freq: Three times a day (TID) | ORAL | 11 refills | Status: DC | PRN
Start: 2020-06-01 — End: 2021-01-31
  Filled 2020-06-01: qty 60, 20d supply, fill #0

## 2020-06-01 MED ORDER — LOPERAMIDE HCL 2 MG OR TABS
ORAL_TABLET | ORAL | 3 refills | Status: DC
Start: 2020-06-01 — End: 2021-01-31
  Filled 2020-06-01: qty 48, 6d supply, fill #0

## 2020-06-01 NOTE — Progress Notes (Signed)
[facility] / [facility] Oral Chemo Education Initial    Olivia Mcconnell is a 79 year old year old female receiving medication education in person     Present at time of consultation: Patient, Family    Medication History:    Outpatient Medications Prior to Visit   Medication Sig Dispense Refill   . acetaminophen 500 MG tablet Take 2 tablets (1,000 mg) by mouth every 6 hours as needed for pain or severe pain. 40 tablet 0   . amLODIPine 10 MG tablet Take 1 tablet (10 mg) by mouth daily. 7 tablet 0   . capecitabine 500 MG tablet Take 3 tablets (1,500 mg) by mouth 2 times a day. Take on days 1 to 14 of each 21 day cycle. Take within 30 mins after food. Do not take first dose until directed by clinical team. 84 tablet 3   . Ferrous Sulfate (iron) 325 (65 Fe) MG tablet Take 1 tablet (325 mg) by mouth daily. 7 tablet 0   . levothyroxine 137 MCG tablet Take 1 tablet (137 mcg) by mouth daily on an empty stomach. 7 tablet 0     No facility-administered medications prior to visit.        Medication Education:  Treatment prescribed: Capecitabine  Administration/schedule: Yes  Duration: 3-6 months  Anticipated start date: 06/02/20  Dispensing pharmacy: [facility]    Education has been provided and understanding was confirmed on the following topics:  -Start date  -Appropriate storage  -Safe handling  -Indication  -Administration and schedule  -Missed doses  -Potential side effects and toxicities  -Symptom management strategies  -Pre-medication regimens  -Supportive care medications  -Food and drug interactions  -Medication disposal  -Contact information and when to call provider/emergency services    Written Material Provided:  Method: Literature, Explanation   [facility] capecitabine counseling sheet   [facility] Capecitabine Oral Chemotherapy Education sheet    Food and Drug Interactions:   Drug Interactions & Interventions: No    Interventions:  The following interventions were addressed related to this education session and the  resolutions are as follows:   Reviewed potential strategies to reinforce adherence - ex. Setting phone alarms, creating a handwritten reminder sheet, ensuring bottle of capecitabine is next to pill box for other medications    Follow-Up Plan:  The patient will have a 7-10 day follow-up call for treatment monitoring, adverse effects, and adherence.    Time spent with patient: 45 minutes. Additional documentation may exist in Flowsheets or Education Activity

## 2020-06-08 ENCOUNTER — Telehealth (HOSPITAL_BASED_OUTPATIENT_CLINIC_OR_DEPARTMENT_OTHER): Payer: Medicare Other

## 2020-06-08 ENCOUNTER — Telehealth (HOSPITAL_BASED_OUTPATIENT_CLINIC_OR_DEPARTMENT_OTHER): Payer: Self-pay

## 2020-06-08 DIAGNOSIS — C182 Malignant neoplasm of ascending colon: Secondary | ICD-10-CM

## 2020-06-08 NOTE — Telephone Encounter (Addendum)
RN called pts son, [name], and reiterated the plan per Dr. Patrice Paradise below. [name] states understanding of plan.     ----- Message from Jennye Boroughs, MD sent at 06/08/2020 12:24 PM PDT -----  Regarding: RE: capecitabine follow up question  I would have her stay with 3 tabs BID and if she runs out early, we will just start anew with the next cycle  ----- Message -----  From: Sherrian Divers  Sent: 06/08/2020  11:42 AM PDT  To: Jennye Boroughs, MD, #  Subject: capecitabine follow up question                  I called pt for the 1 week oral chemo follow up call. Pt states she may have taken 4 tabs on 1 to 2 occasions by mistake (directions are 3 tabs BID) but hasnt confirmed by counting all her remaining tablets. This is per pt report to son and son wasn't there to know for sure if she took 4 or 3 tabs. I told her I would confirm with you on what you would advise. Would you like to prescribe/replace the tabs that were taken extra or ok to skip and let us know later if she runs short at the end of her cycle.     I reiterated the dose of 3 tabs twice daily so she hopefully doesn't repeat it by accident.     Thanks,  [employer]

## 2020-06-08 NOTE — Progress Notes (Signed)
RN called pt for a 1 week oral chemotherapy follow up.     Sx reported: Increased fatigue, taking frequent naps. "always exhausted". 1 evening of diarrhea, improved with antidiarrheals. Denies n/v or HFS. RN encouraged pt to take walks/increase activity when able to help with fatigue. Also encouraged pt to moisturize and evaluate hands/feet daily to watch for HFS.     Pt reports taking 3 tabs QAM and 3 tabs QPM with food roughly 12 hrs apart. She may have taken 4 tabs by mistake 1-2 times but havent confirmed by counting tabs. Will follow up with Dr. Patrice Paradise regarding this.     Have not missed a dose. If missed, would skip.     Started on Tuesday 06/01/20 AM.     Store medication in the kitchen. Temp controlled with no children/pets that have access.     Encouraged pt to call with questions or concerns.

## 2020-06-15 ENCOUNTER — Encounter (HOSPITAL_COMMUNITY): Payer: Self-pay

## 2020-06-15 ENCOUNTER — Telehealth (HOSPITAL_BASED_OUTPATIENT_CLINIC_OR_DEPARTMENT_OTHER): Payer: Self-pay

## 2020-06-15 ENCOUNTER — Encounter (HOSPITAL_BASED_OUTPATIENT_CLINIC_OR_DEPARTMENT_OTHER): Payer: Self-pay | Admitting: Hematology & Oncology

## 2020-06-15 NOTE — Telephone Encounter (Signed)
[name] (pt's son) called stating pt is on her last day in the cycle but has roughly 12 tablets still left. When asked if there was a possibility that pt did not take the capecitabine as ordered (3 tabs BID), [name] was very confident pt has been taking them correctly as pt has moved in with son and he has been present with her when shes taken them. There was about 5 days at the start of the cycle where pt was at home alone and it was thought that she might have taken too many as pt was reporting taking 4 tablets instead of 3 tablets. Pt may have missed or miscalculated at that time however pt believes there was more tablets in the pill bottle than was ordered.     Unable to verify at this point so plan is for pt to finish out this cycle and resume at next cycle. [name] states understanding.

## 2020-06-16 ENCOUNTER — Encounter (HOSPITAL_BASED_OUTPATIENT_CLINIC_OR_DEPARTMENT_OTHER): Payer: Self-pay

## 2020-06-21 ENCOUNTER — Ambulatory Visit (HOSPITAL_BASED_OUTPATIENT_CLINIC_OR_DEPARTMENT_OTHER): Payer: Medicare Other

## 2020-06-21 ENCOUNTER — Ambulatory Visit (HOSPITAL_BASED_OUTPATIENT_CLINIC_OR_DEPARTMENT_OTHER): Payer: Medicare Other | Admitting: Physician Assistant

## 2020-06-21 ENCOUNTER — Other Ambulatory Visit (HOSPITAL_BASED_OUTPATIENT_CLINIC_OR_DEPARTMENT_OTHER): Payer: Self-pay

## 2020-06-21 ENCOUNTER — Ambulatory Visit: Payer: Medicare Other | Attending: Physician Assistant

## 2020-06-21 DIAGNOSIS — R718 Other abnormality of red blood cells: Secondary | ICD-10-CM | POA: Insufficient documentation

## 2020-06-21 DIAGNOSIS — C182 Malignant neoplasm of ascending colon: Secondary | ICD-10-CM | POA: Insufficient documentation

## 2020-06-21 LAB — COMPREHENSIVE METABOLIC PANEL
ALT (GPT): 18 U/L (ref 7–33)
AST (GOT): 17 U/L (ref 9–38)
Albumin: 4.7 g/dL (ref 3.5–5.2)
Alkaline Phosphatase (Total): 106 U/L (ref 49–199)
Anion Gap: 11 (ref 4–12)
Bilirubin (Total): 0.6 mg/dL (ref 0.2–1.3)
Calcium: 10 mg/dL (ref 8.9–10.2)
Carbon Dioxide, Total: 25 meq/L (ref 22–32)
Chloride: 103 meq/L (ref 98–108)
Creatinine: 0.72 mg/dL (ref 0.38–1.02)
Glucose: 142 mg/dL — ABNORMAL HIGH (ref 62–125)
Potassium: 4.2 meq/L (ref 3.6–5.2)
Protein (Total): 7.9 g/dL (ref 6.0–8.2)
Sodium: 139 meq/L (ref 135–145)
Urea Nitrogen: 15 mg/dL (ref 8–21)
eGFR by CKD-EPI: >60 mL/min/{1.73_m2} (ref >59)

## 2020-06-21 LAB — CBC, ABS NEUTROPHIL
Hematocrit: 45 % (ref 36–45)
Hemoglobin: 14.2 g/dL (ref 11.5–15.5)
Immature Granulocytes: 0.06 10*3/uL — ABNORMAL HIGH (ref 0.00–0.05)
MCH: 24.3 pg — ABNORMAL LOW (ref 27.3–33.6)
MCHC: 31.9 g/dL — ABNORMAL LOW (ref 32.2–36.5)
MCV: 76 fL — ABNORMAL LOW (ref 81–98)
Neutrophils: 3.99 10*3/uL (ref 1.80–7.00)
Platelet Count: 183 10*3/uL (ref 150–400)
RBC: 5.84 10*6/uL — ABNORMAL HIGH (ref 3.80–5.00)
RDW-CV: 23.9 % — ABNORMAL HIGH (ref 11.6–14.4)
WBC: 5.71 10*3/uL (ref 4.3–10.0)

## 2020-06-21 NOTE — Progress Notes (Signed)
[facility] Progress Note    ONCOLOGY CARE TEAM  Patient Care Team:  Jennye Boroughs, MD as Oncologist (Medical Oncology)  Manuela Schwartz, MD as Oncologist (Endocrinology)    CC/INTERVAL HISTORY  Jonell Brumbaugh Kleinert is a 79 year old female with ascending colon adenocarcinoma who is now s/p R hemicolectomy on 04/16/20 now on capecitabine monotherapy.    She presents today for evaluation and lab review prior to scheduled treatment. She had grade 1 fatigue with Xeloda. No nausea or vomiting. One day of diarrhea that was resolved with loperamide. No dry, peeling or erythema on hands or feet.    CURRENT TREATMENT  Capecitabine monotherapy - began 06/01/20    ONCOLOGIC HISTORY  Oncology History Overview   04/01/2020: Colonoscopy done for iron deficiency anemia, shows obstructing cecal mass, path consistent with invasive adenocarcinoma   04/06/2020: CT chest/abdomen/pelvis shows primary cecal neoplasm measuring 7.3 x 6.7 x 5.7cm invading ileocecal valve and distal ileum, with transmural penetration of the tumor and local invasion into pericolonic fat. Local lymphadenopathy in the cecal mesentery and R ileocolonic mesentery, largest measuring 2.1 x 1.4 x 1.7cm. No distant metastatic disease.   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 PT3 pN2a            REVIEW OF SYSTEMS  Review of Systems - Oncology  Complete ROS performed and is otherwise negative as related to chief complaint.      ALLERGIES:  Ciprofloxacin, sulfa    HOME MEDICATIONS:    Current Outpatient Medications:   .  acetaminophen 500 MG tablet, Take 2 tablets (1,000 mg) by mouth every 6 hours as needed for pain or severe pain., Disp: 40 tablet, Rfl: 0  .  amLODIPine 10 MG tablet,  Take 1 tablet (10 mg) by mouth daily., Disp: 7 tablet, Rfl: 0  .  capecitabine 500 MG tablet, Take 3 tablets (1,500 mg) by mouth 2 times a day. Take on days 1 to 14 of each 21 day cycle. Take within 30 mins after food. Do not take first dose until directed by clinical team., Disp: 84 tablet, Rfl: 3  .  Ferrous Sulfate (iron) 325 (65 Fe) MG tablet, Take 1 tablet (325 mg) by mouth daily., Disp: 7 tablet, Rfl: 0  .  levothyroxine 137 MCG tablet, Take 1 tablet (137 mcg) by mouth daily on an empty stomach., Disp: 7 tablet, Rfl: 0  .  loperamide 2 MG tablet, Take 2 tablets by mouth with first episode of diarrhea, then 1 tablet with every loose stool until no diarrhea for 12 hours. Max 8 tablets daily., Disp: 60 tablet, Rfl: 3  .  ondansetron 8 MG tablet, Take 1 tablet (8 mg) by mouth every 8 hours as needed for nausea/vomiting., Disp: 60 tablet, Rfl: 11  .  prochlorperazine 10 MG tablet, Take 1 tablet (10 mg) by mouth every 6 hours as needed for nausea/vomiting., Disp: 60 tablet, Rfl: 11    PAST MEDICAL / PAST SURGICAL HISTORY:  Thyroid cancer, breast mass, colon cancer, total knee replacement, total thyroidectomy, appendectomy, colonoscopy    FAMILY HISTORY:  Breast cancer, sister. Kidney cancer, brother. RA, father    SOCIAL HISTORY:  Never smoker. No alcohol, illicit drug use.    ECOG PERFORMANCE STATUS  ECOG:  (2) Ambulatory and capable of self  care, unable to carry out work activity, up and about > 50% or waking hours      PHYSICAL EXAMINATION  VITAL SIGNS: BP 119/75   Pulse 73   Temp 36.8 C (Oral)   Resp 18   Wt 61.7 kg (136 lb)   BMI 24.74 kg/m    GENERAL:  No acute distress  HEENT:   Pupils round, reactive to light. Sclera anicteric. Oropharynx clear without erythema, exudates, acute lesions. No ulcers.  NECK:   Supple  HEART:   No murmurs, lifts, heaves. Regular rhythm.  LUNGS:   Clear to auscultation bilaterally. No wheezes, rhonchi or rales.  ABDOMEN:   No distention. Non-tender. Bowel sounds normal. No  hepatosplenomegaly.  SKIN:    No Rashes on exposed skin.  EXT:     Warm. No edema.  NEURO:  A&O. Cranial nerves grossly intact.    LABORATORY STUDIES  WBC 5.71, ANC 3.99, PLT 183, Cr 0.72    Recent Labs     04/09/20  1611   CEA 2.5       IMAGING/PERTINENT STUDIES  No new imaging to review today.    ASSESSMENT/PLAN  79 year old female with a history of ascending colon adenocarcinoma who is now s/p R hemicolectomy on 04/16/20. She presents for clinical evaluation while on capecitabine monotherapy.    1) Colon cancer.               *Treatment. Based on history, labs, and physical exam, the patient is appropriate for treatment today. Begin C2D1 of capecitabine today.  - plan for 6 months of treatment  - continue current capecitabine dose 1500mg  BID  - consider decreasing to 1500 mg am and 1000 mg pm if fatigue worsens    2) Vaccines. Ok to get Covid19 booster vaccine today.     3) Microcytosis without anemia. History of iron deficiency. Her PCP is managing this. She takes oral iron daily.      FOLLOW UP  Return to clinic in 3 weeks or sooner if clinically indicated.

## 2020-07-07 NOTE — Progress Notes (Signed)
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 6//21 Colonoscopy: mass in cecum inv IC valve. 04/06/20 CT CAP: 7.3 x 6.7 cecal mass inv TI, local LN to 21 x 14 x 27m, no distant mets. CEA 2.5.  2. 04/16/20. R hemicolectomy ([facility]. pT3N2a. 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 06/01/20 - present. Adjuvant capecitabine. C/b fatigue    INTERVAL EVENTS  Senna sast hat her biggest issue is fatigue. She can tell when she is on vs. Off chemo. She has to now take a couple naps per day. A little hair loss. Missed 1 dose this past cycle when she overslept. Minimal to no GI issues.    ECOG PERFORMANCE STATUS  ECOG: (1) Restricted in physically strenuous activity, ambulatory and able to do work of light nature     [place] given to patient for completion, with pertinent findings noted above. Complete review of systems otherwise negative.    PROBLEM LIST  Patient Active Problem List   Diagnosis   . Papillary thyroid carcinoma ([place]   . Osteoporosis   . Essential hypertension   . Dyslipidemia   . Postoperative hypothyroidism   . Acute cystitis without hematuria   . Cancer of ascending colon ([place]   . 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 Thyroid cancer ([place] 2011   . Breast mass 1990    Removed   . Anemia     Little   . Colon cancer ([facility]    Breast cancer  - R mastectomy  1990  Thyroid cancer  - total thyroidectomy 2011    Past Surgical History:   Procedure Laterality Date   . COLONOSCOPY  04/01/2020   . PR APPENDECTOMY  1990s   . PR UNLISTED PROCEDURE BREAST Right 1990   . THYROID SURGERY  2011    total thyroidectomy   . TOTAL KNEE ARTHROPLASTY Left 09/2014       FAMILY HISTORY  Family History     Problem (# of Occurrences) Relation (Name,Age of Onset)    Breast Cancer (1) Sister    Kidney Cancer (1) Brother    Rheumatoid Arthritis (1) Father          SOCIAL HISTORY  [place] her two sons ([name]. [facility] Never smoker, occasional alcohol      ALLERGIES  Review of patient's allergies indicates:  Allergies   Allergen Reactions   . Ciprofloxacin Unknown   . Sulfa Antibiotics Unknown       HOME MEDICATIONS   Current Outpatient Medications:   .  acetaminophen 500 MG tablet, Take 2 tablets (1,000 mg) by mouth every  6 hours as needed for pain or severe pain., Disp: 40 tablet, Rfl: 0  .  amLODIPine 10 MG tablet, Take 1 tablet (10 mg) by mouth daily., Disp: 7 tablet, Rfl: 0  .  capecitabine 500 MG tablet, Take 3 tablets (1,500 mg) by mouth 2 times a day. Take on days 1 to 14 of each 21 day cycle. Take within 30 mins after food. Do not take first dose until directed by clinical team., Disp: 84 tablet, Rfl: 3  .  Ferrous Sulfate (iron) 325 (65 Fe) MG tablet, Take 1 tablet (325 mg) by mouth daily., Disp: 7 tablet, Rfl: 0  .  levothyroxine 137 MCG tablet, Take 1 tablet (137 mcg) by mouth daily on an empty stomach., Disp: 7 tablet, Rfl: 0  .  loperamide 2 MG tablet, Take 2 tablets by mouth with first episode of diarrhea, then 1 tablet with every loose stool until no diarrhea for 12 hours. Max 8 tablets daily., Disp: 60 tablet, Rfl: 3  .  ondansetron 8 MG tablet, Take 1 tablet (8 mg) by mouth every 8 hours as needed for nausea/vomiting., Disp: 60 tablet, Rfl: 11  .   prochlorperazine 10 MG tablet, Take 1 tablet (10 mg) by mouth every 6 hours as needed for nausea/vomiting., Disp: 60 tablet, Rfl: 11     PHYSICAL EXAMINATION  VITAL SIGNS: BP 137/77   Pulse 72   Temp 36.5 C (Oral)   Resp 18   Wt 63 kg (138 lb 12.8 oz)   SpO2 98%   BMI 25.25 kg/m    GENERAL: well-appearing, no acute distress  HEENT: sclera anicteric, normal pupils  EXT: warm and well perfused, no edema  NEURO: grossly normal, narrow-based gait with a walker  SKIN: no rashes, lesions, or jaundice    LABORATORY STUDIES  Lab on 07/12/20   1. Comprehensive Metabolic Panel   Result Value Ref Range    Sodium 140 135 - 145 meq/L    Potassium 3.7 3.6 - 5.2 meq/L    Chloride 106 98 - 108 meq/L    Carbon Dioxide, Total 26 22 - 32 meq/L    Anion Gap 8 4 - 12    Glucose 121 62 - 125 mg/dL    Urea Nitrogen 19 8 - 21 mg/dL    Creatinine 0.67 0.38 - 1.02 mg/dL    Protein (Total) 7.2 6.0 - 8.2 g/dL    Albumin 4.4 3.5 - 5.2 g/dL    Bilirubin (Total) 0.6 0.2 - 1.3 mg/dL    Calcium 9.5 8.9 - 10.2 mg/dL    AST (GOT) 16 9 - 38 U/L    Alkaline Phosphatase (Total) 81 49 - 199 U/L    ALT (GPT) 15 7 - 33 U/L    eGFR, Calculated >60 >59 mL/min/[1.73_m2]    GFR, Information       Calculated GFR by CKD-EPI equation. Inaccurate with changing renal function. See [email or web address].   2. CBC, Abs Neutrophil   Result Value Ref Range    WBC 5.72 4.3 - 10.0 10*3/uL    RBC 5.60 (H) 3.80 - 5.00 10*6/uL    Hemoglobin 14.9 11.5 - 15.5 g/dL    Hematocrit 45 36 - 45 %    MCV 80 (L) 81 - 98 fL    MCH 26.6 (L) 27.3 - 33.6 pg    MCHC 33.5 32.2 - 36.5 g/dL    Platelet Count 146 (L) 150 - 400 10*3/uL    RDW-CV 23.6 (H) 11.6 - 14.4 %  Absolute Neutrophil Comment Comment not required     Neutrophils 3.48 1.80 - 7.00 10*3/uL    Immature Granulocytes 0.01 0.00 - 0.05 10*3/uL    RBC Morphology See RBC data     Platelet Morphology See PLT count     WBC Morphology See Diff        Recent Labs     04/09/20  1611   CEA 2.5          PLAN:  1) Colon cancer  Keyanah has a medullary cancer, which is a rarer subtype. Surprisingly, her tumor is MSS with low TMB. After resection she elected adjuvant treatment with capecitabine monotherapy to mitigate toxicities. Our initial aim was for at least 3 months of treatment; will give 6 if tolerated.    Chyane today questions the role and benefit of adjuvant therapy as she finds her fatigue not intolerable, but bothersome. Her son says that her life is basically the same in terms of what she is able to do even when she has cancer, so encourages her to do as much as possible.  We then went into great detail about the data surrounding the benefit of adjuvant chemotherapy.  I reviewed the Sobrero et al modeling data, noting that for her T3N2A disease, we would estimate 5-year DFS to be 47% from surgery alone, an additional 12% from capecitabine monotherapy (typically given for 6 months), and then additional 5 to 7% of oxaliplatin was used, (depending on duration of therapy).  In hearing this, she and her son summarized that she has an approximately 50/50 chance of recurrence.  He was encouraging her to continue on with therapy while she was leaning away from it.  Accordingly, I offered the following options:   1. Continue with chemotherapy as planned.  Aim for up to 6 months of treatment, depending on tolerability  2.  Take a 1 week break and then resume chemotherapy as previous  3.  Resume chemotherapy today, but at a dose reduction of 1000 mg twice daily    We discussed this extensively in her visit and she was unable to make a decision at that time.  Thus, she and her son will talk this over further and then let us know if she decides not to start therapy today. We also briefly discussed surveillance plans    2) Treatment: capecitabine  Labs and performance status acceptable for treatment today. No concerning residual side effects. 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  Continue to monitor  *Fatigue: not severe, but is bothersome. Ok to reduce as above.    3) Nutrition  No major issues    4) Pain  None discussed today    RTC in 3 weeks for C4D1, or TBD depending on future chemo plan    This note was completed at least in part with voice recognition software and, thus, may contain errors or sound-alike words, despite efforts at proofreading.

## 2020-07-12 ENCOUNTER — Ambulatory Visit: Payer: Medicare Other | Attending: Hematology & Oncology

## 2020-07-12 ENCOUNTER — Ambulatory Visit (HOSPITAL_BASED_OUTPATIENT_CLINIC_OR_DEPARTMENT_OTHER): Payer: Medicare Other

## 2020-07-12 ENCOUNTER — Other Ambulatory Visit (HOSPITAL_BASED_OUTPATIENT_CLINIC_OR_DEPARTMENT_OTHER): Payer: Self-pay

## 2020-07-12 ENCOUNTER — Ambulatory Visit (HOSPITAL_BASED_OUTPATIENT_CLINIC_OR_DEPARTMENT_OTHER): Payer: Medicare Other | Admitting: Hematology & Oncology

## 2020-07-12 DIAGNOSIS — C182 Malignant neoplasm of ascending colon: Secondary | ICD-10-CM | POA: Insufficient documentation

## 2020-07-12 DIAGNOSIS — L658 Other specified nonscarring hair loss: Secondary | ICD-10-CM | POA: Insufficient documentation

## 2020-07-12 DIAGNOSIS — R5383 Other fatigue: Secondary | ICD-10-CM | POA: Insufficient documentation

## 2020-07-12 LAB — COMPREHENSIVE METABOLIC PANEL
ALT (GPT): 15 U/L (ref 7–33)
AST (GOT): 16 U/L (ref 9–38)
Albumin: 4.4 g/dL (ref 3.5–5.2)
Alkaline Phosphatase (Total): 81 U/L (ref 49–199)
Anion Gap: 8 (ref 4–12)
Bilirubin (Total): 0.6 mg/dL (ref 0.2–1.3)
Calcium: 9.5 mg/dL (ref 8.9–10.2)
Carbon Dioxide, Total: 26 meq/L (ref 22–32)
Chloride: 106 meq/L (ref 98–108)
Creatinine: 0.67 mg/dL (ref 0.38–1.02)
Glucose: 121 mg/dL (ref 62–125)
Potassium: 3.7 meq/L (ref 3.6–5.2)
Protein (Total): 7.2 g/dL (ref 6.0–8.2)
Sodium: 140 meq/L (ref 135–145)
Urea Nitrogen: 19 mg/dL (ref 8–21)
eGFR by CKD-EPI: >60 mL/min/{1.73_m2} (ref >59)

## 2020-07-12 LAB — CBC, ABS NEUTROPHIL
Hematocrit: 45 % (ref 36–45)
Hemoglobin: 14.9 g/dL (ref 11.5–15.5)
Immature Granulocytes: 0.01 10*3/uL (ref 0.00–0.05)
MCH: 26.6 pg — ABNORMAL LOW (ref 27.3–33.6)
MCHC: 33.5 g/dL (ref 32.2–36.5)
MCV: 80 fL — ABNORMAL LOW (ref 81–98)
Neutrophils: 3.48 10*3/uL (ref 1.80–7.00)
Platelet Count: 146 10*3/uL — ABNORMAL LOW (ref 150–400)
RBC: 5.6 10*6/uL — ABNORMAL HIGH (ref 3.80–5.00)
RDW-CV: 23.6 % — ABNORMAL HIGH (ref 11.6–14.4)
WBC: 5.72 10*3/uL (ref 4.3–10.0)

## 2020-07-15 ENCOUNTER — Telehealth (HOSPITAL_BASED_OUTPATIENT_CLINIC_OR_DEPARTMENT_OTHER): Payer: Medicare Other

## 2020-07-15 ENCOUNTER — Telehealth (HOSPITAL_BASED_OUTPATIENT_CLINIC_OR_DEPARTMENT_OTHER): Payer: Self-pay

## 2020-07-15 DIAGNOSIS — C182 Malignant neoplasm of ascending colon: Secondary | ICD-10-CM

## 2020-07-15 NOTE — Telephone Encounter (Signed)
Per pt's son,[name], Olivia Mcconnell started capecitabine at the original 1500 mg dose on the evening of Tuesday 10/5 and has been taking it BID since.  So far she is doing ok besides the on-going fatigue.    Confirmed next appt on 10/25 with plan to call with any new or worsening s/sx.

## 2020-07-15 NOTE — Progress Notes (Addendum)
Pt scheduled for RN phone check-in to review treatment decision.    Seen by Dr Patrice Paradise on 10/4 and she reviewed the different options as follows;  1. Continue with chemotherapy as planned.  Aim for up to 6 months of treatment, depending on tolerability  2.  Take a 1 week break and then resume chemotherapy as previous  3.  Resume chemotherapy today, but at a dose reduction of 1000 mg twice daily    Left vm for pt to call back.  Able to speak with pt's son, [name].    He reports that Jerrine started capecitabine at the original 1500 mg dose on the evening of Tuesday 10/5 and has been taking it BID since.  So far she is doing ok besides the on-going fatigue.    Confirmed next appt on 10/25 with plan to call with any new or worsening s/sx.    RN to send FYI to provider team.      Addendum:  Pt called back and confirmed the report from her son. She notes that she was not aware of the option to reduce her dose.  She wonders if this would help the fatigue and we discussed that the goal with the dose reduction would be to see if this did help with the side effects, for her the worst side effect is fatigue. She will call back if she would like to do this.

## 2020-07-30 ENCOUNTER — Other Ambulatory Visit (HOSPITAL_BASED_OUTPATIENT_CLINIC_OR_DEPARTMENT_OTHER): Payer: Self-pay

## 2020-07-30 NOTE — Progress Notes (Deleted)
[facility]   GI [place] NOTE    ONCOLOGY CARE TEAM  Patient Care Team:  Jennye Boroughs, MD as Oncologist (Medical Oncology)  Manuela Schwartz, MD as Oncologist (Endocrinology)    IDENTIFICATION / ASSESSMENT  Olivia Mcconnell is a 79 year old female with a history of ascending colon adenocarcinoma who is now s/p R hemicolectomy on 04/16/20. She presents for clinical evaluation prior to starting capecitabine monotherapy.    CURRENT TREATMENT  Treatment goal Curative   Plan Name OP Capecitabine (Monotherapy)   Start Date 06/01/2020 (Planned)   End Date 08/03/2020 (Planned)       ONCOLOGIC HISTORY  1          .Presented with anemia, SOB. 6//21 Colonoscopy: mass in cecum inv IC valve. 04/06/20 CT CAP: 7.3 x 6.7 cecal mass inv TI, local LN to 21 x 14 x 53m, no distant mets. CEA 2.5.  2.         04/16/20. R hemicolectomy ([facility]. pT3N2a. 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         06/01/20 - present. Adjuvant capecitabine. C/b fatigue    INTERVAL EVENTS  Here today with her son.       Considering stopping therapy    ***  Wants the covid booster shot. Is worried about starting chemo because she doesn't want it to interfere with the covid booster.  [place]'t walk much[place]   Healing well.  No pain. Sleeping ok.     ROS  Denies fever, chills.  Denies mucositis.  Denies changes in appetite, nausea, vomiting, constipation, or diarrhea.  Denies dysuria, blood in urine or stool.  Denies neuropathy.  Denies rash or skin changes.  Denies chest pain, cough, shortness of breath, or edema.  Complete review of systems negative except as noted in HPI/interval history.    PAST MEDICAL HISTORY  Past Medical History:   Diagnosis Date   . Thyroid cancer ([place] 2011   . Breast mass 1990    Removed   . Anemia     Little   . Colon cancer ([facility]      Past Surgical History:   Procedure Laterality Date   . COLONOSCOPY  04/01/2020    . PR APPENDECTOMY  1990s   . PR UNLISTED PROCEDURE BREAST Right 1990   . THYROID SURGERY  2011    total thyroidectomy   . TOTAL KNEE ARTHROPLASTY Left 09/2014        FAMILY HISTORY  Family History     Problem (# of Occurrences) Relation (Name,Age of Onset)    Breast Cancer (1) Sister    Kidney Cancer (1) Brother    Rheumatoid Arthritis (1) Father          SOCIAL HISTORY  [place] her two sons ([name]. [facility] Never smoker, occasional alcohol.    ALLERGIES  Review of patient's allergies indicates:  Allergies   Allergen Reactions   . Ciprofloxacin Unknown   . Sulfa Antibiotics Unknown       HOME MEDICATIONS   Current Outpatient Medications:   .  acetaminophen 500 MG tablet, Take 2 tablets (1,000 mg) by mouth every 6 hours as needed for pain or severe pain., Disp: 40 tablet, Rfl: 0  .  amLODIPine 10 MG tablet, Take 1 tablet (10 mg) by mouth daily., Disp: 7 tablet, Rfl: 0  .  capecitabine 500 MG tablet, Take 3 tablets (1,500 mg) by mouth 2 times a day. Take on days 1 to 14 of each 21 day cycle. Take within 30 mins after food. Do not take first dose until directed by clinical team., Disp: 84 tablet, Rfl: 3  .  Ferrous Sulfate (iron) 325 (65 Fe) MG tablet, Take 1 tablet (325 mg) by mouth daily., Disp: 7 tablet, Rfl: 0  .  levothyroxine 137 MCG tablet, Take 1 tablet (137 mcg) by mouth daily on an empty stomach., Disp: 7 tablet, Rfl: 0  .  loperamide 2 MG tablet, Take 2 tablets by mouth with first episode of diarrhea, then 1 tablet with every loose stool until no diarrhea for 12 hours. Max 8 tablets daily., Disp: 60 tablet, Rfl: 3  .  ondansetron 8 MG tablet, Take 1 tablet (8 mg) by mouth every 8 hours as needed for nausea/vomiting., Disp: 60 tablet, Rfl: 11  .  prochlorperazine 10 MG tablet, Take 1 tablet (10 mg) by mouth every 6 hours as needed for nausea/vomiting., Disp: 60 tablet, Rfl: 11        ECOG PERFORMANCE STATUS  ECOG: (2) Ambulatory and capable of self care, unable to carry out work activity, up and about > 50% or waking hours     PHYSICAL EXAMINATION  VITALS: see flowsheet  GENERAL: In no acute distress.  HEENT: Normocephalic, atraumatic, sclera anicteric and without erythema, without nasal drainage, oropharynx moist and clear without erythema, dentition intact.   RESPIRATORY: Lungs clear to auscultation bilaterally anteriorly and posteriorly with no wheezing, crackles, or rhonchi.  CARDIOVASCULAR:  Regular rate and rhythm. No murmurs, gallops or rubs. Warm, well perfused, no cyanosis. No lower extremity edema.  ABDOMEN:  Soft, nontender, nondistended. No rebound tenderness or guarding. Bowel sounds normoactive.  NEURO:  Alert and oriented x3. No focal neurological deficits.  MSK: Normal gait/stance. Ambulates independently.   SKIN:  No rash, jaundice, petechia, bruising, or ulcer.  PSYCH:  Appropriate affect.    LABORATORY STUDIES  No results found for any visits on 08/02/20.    Recent Labs     04/09/20  1611   CEA 2.5       IMAGING  No new imaging.     PLAN:  1) Colon cancer.    *Treatment. Based on history, labs, and physical exam, the patient is appropriate for treatment today. Will give C 4 D 1 of capecitabine today.  - plan for 6 months of treatment  - capecitabine dose reduction of $RemoveBefo'1000mg'nzUtCTSgdrS$  BID  - discussed treatment cadence, side effects, and managing side effects with antiemetics and anti-diarrheals as needed    3) Microcytosis without anemia. History of iron deficiency. Her PCP is managing this. She takes oral iron daily.     Return to clinic in 3 weeks. Patient instructed to call clinic in the interim with questions or concerns.

## 2020-08-02 ENCOUNTER — Other Ambulatory Visit (HOSPITAL_BASED_OUTPATIENT_CLINIC_OR_DEPARTMENT_OTHER): Payer: Medicare Other

## 2020-08-02 ENCOUNTER — Encounter (HOSPITAL_BASED_OUTPATIENT_CLINIC_OR_DEPARTMENT_OTHER): Payer: Medicare Other | Admitting: Nurse Practitioner

## 2020-08-02 NOTE — Progress Notes (Signed)
[facility]   GI [place] NOTE    ONCOLOGY CARE TEAM  Patient Care Team:  Jennye Boroughs, MD as Oncologist (Medical Oncology)  Manuela Schwartz, MD as Oncologist (Endocrinology)    IDENTIFICATION / ASSESSMENT  Olivia Mcconnell is a 79 year old female with a history of ascending colon adenocarcinoma who is now s/p R hemicolectomy on 04/16/20. She presents for clinical evaluation prior to starting capecitabine monotherapy.    CURRENT TREATMENT  Treatment goal Curative   Plan Name OP Capecitabine (Monotherapy)   Start Date 06/01/2020   End Date 10/05/2020 (Planned)       ONCOLOGIC HISTORY  1          .Presented with anemia, SOB. 6//21 Colonoscopy: mass in cecum inv IC valve. 04/06/20 CT CAP: 7.3 x 6.7 cecal mass inv TI, local LN to 21 x 14 x 78m, no distant mets. CEA 2.5.  2.         04/16/20. R hemicolectomy ([facility]. pT3N2a. 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         06/01/20 - present. Adjuvant capecitabine. C/b fatigue    INTERVAL EVENTS  Here today with her son.   Considering stopping therapy.  Energy is low when she's on chemo. Napping more now that shes on chemo. [facility]    ROS  Denies fever, chills.  Denies mucositis.  Denies changes in appetite, nausea, vomiting, constipation, or diarrhea.  Denies dysuria, blood in urine or stool.  Denies neuropathy.  Denies rash or skin changes.  Denies chest pain, cough, shortness of breath, or edema.  Complete review of systems negative except as noted in HPI/interval history.    PAST MEDICAL HISTORY  Past Medical History:   Diagnosis Date    Thyroid cancer ([place] 2011    Breast mass 1990    Removed    Anemia     Little    Colon cancer ([facility]      Past Surgical History:   Procedure Laterality Date    COLONOSCOPY  04/01/2020    PR APPENDECTOMY  1990s    PR UNLISTED PROCEDURE BREAST Right 1990    THYROID SURGERY  2011    total  thyroidectomy    TOTAL KNEE ARTHROPLASTY Left 09/2014        FAMILY HISTORY  Family History     Problem (# of Occurrences) Relation (Name,Age of Onset)    Breast Cancer (1) Sister    Kidney Cancer (1) Brother    Rheumatoid Arthritis (1) Father          SOCIAL HISTORY  [place] her two sons ([name]. [facility] Never smoker, occasional alcohol.    ALLERGIES  Review of patient's allergies indicates:  Allergies   Allergen Reactions    Ciprofloxacin Unknown    Sulfa Antibiotics Unknown       HOME MEDICATIONS   Current Outpatient Medications:     acetaminophen 500 MG tablet, Take 2 tablets (1,000 mg) by mouth every 6 hours as needed for pain or severe pain., Disp: 40 tablet, Rfl: 0    amLODIPine 10 MG tablet, Take 1 tablet (10 mg) by mouth daily., Disp: 7 tablet, Rfl: 0    capecitabine 500 MG tablet,  Take 3 tablets (1,500 mg) by mouth 2 times a day. Take on days 1 to 14.  Repeat every 21 days. Take within 30 mins after food.., Disp: 84 tablet, Rfl: 3    Ferrous Sulfate (iron) 325 (65 Fe) MG tablet, Take 1 tablet (325 mg) by mouth daily., Disp: 7 tablet, Rfl: 0    levothyroxine 137 MCG tablet, Take 1 tablet (137 mcg) by mouth daily on an empty stomach., Disp: 7 tablet, Rfl: 0    loperamide 2 MG tablet, Take 2 tablets by mouth with first episode of diarrhea, then 1 tablet with every loose stool until no diarrhea for 12 hours. Max 8 tablets daily., Disp: 60 tablet, Rfl: 3    ondansetron 8 MG tablet, Take 1 tablet (8 mg) by mouth every 8 hours as needed for nausea/vomiting., Disp: 60 tablet, Rfl: 11    prochlorperazine 10 MG tablet, Take 1 tablet (10 mg) by mouth every 6 hours as needed for nausea/vomiting., Disp: 60 tablet, Rfl: 11     ECOG PERFORMANCE STATUS  ECOG: (2) Ambulatory and capable of self care, unable to carry out work activity, up and about > 50% or waking hours     PHYSICAL  EXAMINATION  VITALS: see flowsheet  GENERAL: In no acute distress.  HEENT: Normocephalic, atraumatic, sclera anicteric and without erythema, without nasal drainage, oropharynx moist and clear without erythema, dentition intact.   RESPIRATORY: Lungs clear to auscultation bilaterally anteriorly and posteriorly with no wheezing, crackles, or rhonchi.  CARDIOVASCULAR:  Regular rate and rhythm. No murmurs, gallops or rubs. Warm, well perfused, no cyanosis. No lower extremity edema.  ABDOMEN:  Soft, nontender, nondistended. No rebound tenderness or guarding. Bowel sounds normoactive.  NEURO:  Alert and oriented x3. No focal neurological deficits.  MSK: Normal gait/stance. Ambulates independently with walker.  SKIN:  No rash, jaundice, petechia, bruising, or ulcer.  PSYCH:  Appropriate affect.    LABORATORY STUDIES  Lab on 08/03/20   1. CBC, Abs Neutrophil   Result Value Ref Range    WBC 6.27 4.3 - 10.0 10*3/uL    RBC 5.35 (H) 3.80 - 5.00 10*6/uL    Hemoglobin 15.2 11.5 - 15.5 g/dL    Hematocrit 44 36 - 45 %    MCV 83 81 - 98 fL    MCH 28.4 27.3 - 33.6 pg    MCHC 34.4 32.2 - 36.5 g/dL    Platelet Count 227 150 - 400 10*3/uL    RDW-CV 23.3 (H) 11.6 - 14.4 %    Absolute Neutrophil Comment Comment not required     Neutrophils 4.17 1.80 - 7.00 10*3/uL    Immature Granulocytes 0.02 0.00 - 0.05 10*3/uL    RBC Morphology See RBC data     Platelet Morphology See PLT count     WBC Morphology See Diff    2. Comprehensive Metabolic Panel   Result Value Ref Range    Sodium 141 135 - 145 meq/L    Potassium 4.2 3.6 - 5.2 meq/L    Chloride 105 98 - 108 meq/L    Carbon Dioxide, Total 28 22 - 32 meq/L    Anion Gap 8 4 - 12    Glucose 111 62 - 125 mg/dL    Urea Nitrogen 14 8 - 21 mg/dL    Creatinine 0.65 0.38 - 1.02 mg/dL    Protein (Total) 7.2 6.0 - 8.2 g/dL    Albumin 4.5 3.5 - 5.2 g/dL    Bilirubin (Total) 0.9 0.2 - 1.3  mg/dL    Calcium 9.7 8.9 - 10.2 mg/dL    AST (GOT) 16 9 - 38 U/L    Alkaline Phosphatase (Total) 90 49 - 199 U/L    ALT  (GPT) 17 7 - 33 U/L    eGFR, Calculated >60 >59 mL/min/[1.73_m2]    GFR, Information       Calculated GFR by CKD-EPI equation. Inaccurate with changing renal function. See [email or web address].       Recent Labs     04/09/20  1611   CEA 2.5       IMAGING  No new imaging.     PLAN:  1) Colon cancer.    *Treatment. Based on history, labs, and physical exam, the patient is appropriate for treatment today. Will give C 4 D 1 of capecitabine today.  - plan for 6 months of treatment  - she still questions whether or not she wants to continue treatment, but has opted to continue today  - capecitabine dose reduction of '1500mg'$  BID days 1-14 q21 days, patient had been offered dose of '1000mg'$  BID but she declined this and wanted to stay on a higher dose after discussion with her son [name].  - she's going to [facility] November 2-22, we will try to get her next cycle dispensed early for vacation, pharmacy to contact patient directly. They are aware.   - [name] has been granted My Chart access to Krystalyn's chart per her request    2) Microcytosis without anemia. History of iron deficiency. Continues oral iron with improvement.    3) Fatigue. Treatment related. Improves in her off week. Encouraged daily walking and working on endurance.     Return to clinic in 3 weeks. Patient instructed to call clinic in the interim with questions or concerns.

## 2020-08-03 ENCOUNTER — Other Ambulatory Visit (HOSPITAL_BASED_OUTPATIENT_CLINIC_OR_DEPARTMENT_OTHER): Payer: Self-pay

## 2020-08-03 ENCOUNTER — Other Ambulatory Visit (HOSPITAL_BASED_OUTPATIENT_CLINIC_OR_DEPARTMENT_OTHER): Payer: Self-pay | Admitting: Oncology

## 2020-08-03 ENCOUNTER — Other Ambulatory Visit (HOSPITAL_BASED_OUTPATIENT_CLINIC_OR_DEPARTMENT_OTHER): Payer: Self-pay | Admitting: Hematology & Oncology

## 2020-08-03 ENCOUNTER — Ambulatory Visit (HOSPITAL_BASED_OUTPATIENT_CLINIC_OR_DEPARTMENT_OTHER): Payer: Medicare Other | Admitting: Nurse Practitioner

## 2020-08-03 ENCOUNTER — Ambulatory Visit: Payer: Medicare Other | Attending: Hematology & Oncology

## 2020-08-03 VITALS — BP 160/91 | HR 72 | Temp 98.1°F | Resp 16 | Wt 140.6 lb

## 2020-08-03 DIAGNOSIS — C182 Malignant neoplasm of ascending colon: Secondary | ICD-10-CM

## 2020-08-03 DIAGNOSIS — R718 Other abnormality of red blood cells: Secondary | ICD-10-CM | POA: Insufficient documentation

## 2020-08-03 LAB — COMPREHENSIVE METABOLIC PANEL
ALT (GPT): 17 U/L (ref 7–33)
AST (GOT): 16 U/L (ref 9–38)
Albumin: 4.5 g/dL (ref 3.5–5.2)
Alkaline Phosphatase (Total): 90 U/L (ref 49–199)
Anion Gap: 8 (ref 4–12)
Bilirubin (Total): 0.9 mg/dL (ref 0.2–1.3)
Calcium: 9.7 mg/dL (ref 8.9–10.2)
Carbon Dioxide, Total: 28 meq/L (ref 22–32)
Chloride: 105 meq/L (ref 98–108)
Creatinine: 0.65 mg/dL (ref 0.38–1.02)
Glucose: 111 mg/dL (ref 62–125)
Potassium: 4.2 meq/L (ref 3.6–5.2)
Protein (Total): 7.2 g/dL (ref 6.0–8.2)
Sodium: 141 meq/L (ref 135–145)
Urea Nitrogen: 14 mg/dL (ref 8–21)
eGFR by CKD-EPI: >60 mL/min/{1.73_m2} (ref >59)

## 2020-08-03 LAB — CBC, ABS NEUTROPHIL
Hematocrit: 44 % (ref 36–45)
Hemoglobin: 15.2 g/dL (ref 11.5–15.5)
Immature Granulocytes: 0.02 10*3/uL (ref 0.00–0.05)
MCH: 28.4 pg (ref 27.3–33.6)
MCHC: 34.4 g/dL (ref 32.2–36.5)
MCV: 83 fL (ref 81–98)
Neutrophils: 4.17 10*3/uL (ref 1.80–7.00)
Platelet Count: 227 10*3/uL (ref 150–400)
RBC: 5.35 10*6/uL — ABNORMAL HIGH (ref 3.80–5.00)
RDW-CV: 23.3 % — ABNORMAL HIGH (ref 11.6–14.4)
WBC: 6.27 10*3/uL (ref 4.3–10.0)

## 2020-08-03 MED ORDER — CAPECITABINE 500 MG OR TABS
1000.0000 mg/m2 | ORAL_TABLET | Freq: Two times a day (BID) | ORAL | 3 refills | Status: DC
Start: 2020-08-03 — End: 2020-08-04

## 2020-08-04 ENCOUNTER — Other Ambulatory Visit (HOSPITAL_BASED_OUTPATIENT_CLINIC_OR_DEPARTMENT_OTHER): Payer: Self-pay

## 2020-08-04 ENCOUNTER — Other Ambulatory Visit (HOSPITAL_BASED_OUTPATIENT_CLINIC_OR_DEPARTMENT_OTHER): Payer: Self-pay | Admitting: Oncology

## 2020-08-04 ENCOUNTER — Other Ambulatory Visit (HOSPITAL_BASED_OUTPATIENT_CLINIC_OR_DEPARTMENT_OTHER): Payer: Self-pay | Admitting: Nurse Practitioner

## 2020-08-04 DIAGNOSIS — C182 Malignant neoplasm of ascending colon: Secondary | ICD-10-CM

## 2020-08-04 MED ORDER — CAPECITABINE 500 MG OR TABS
1000.0000 mg/m2 | ORAL_TABLET | Freq: Two times a day (BID) | ORAL | 3 refills | Status: DC
Start: 2020-08-04 — End: 2020-08-06

## 2020-08-04 MED ORDER — CAPECITABINE 500 MG OR TABS
1000.0000 mg/m2 | ORAL_TABLET | Freq: Two times a day (BID) | ORAL | 3 refills | Status: DC
Start: 2020-08-04 — End: 2020-08-04

## 2020-08-04 NOTE — Progress Notes (Signed)
[facility] General Oncology Clinical Pharmacist ORAL Chemotherapy Verification     Indication: Cancer of ascending colon ([place]), Stage III     Treatment Plan: OP Capecitabine (Monotherapy)      Dose: Capecitabine 1500 mg PO BID on Days 1 to 14 every 21 days  Dose rounded per MD for patient age.     Cycle: Refill  Adjuvant, 6 months planned     Monitoring:   CrCl ~ 60 mL/min (BW = 62.3 kg, Scr rounded to 0.7 mg/dL). No dose adjustment recommended for renal function.    DDIs: Reviewed. Identified the following DDIs:  Recommend holding Saccharomyces boulardii probiotic while on chemotherapy due to risk of infection while neutropenic (obtained from outside medical record, prescribed in s/o C. difficile infection?)     Other:   Holding oxaliplatin for age, patient preference to limit toxicity     Reference:   NCCN Guidelines. "Colon Cancer." Ver 2.2021.     Jequan Shahin Pharm.D., BCOP  Clinical Oncology Pharmacist

## 2020-08-05 ENCOUNTER — Other Ambulatory Visit (HOSPITAL_BASED_OUTPATIENT_CLINIC_OR_DEPARTMENT_OTHER): Payer: Self-pay

## 2020-08-06 ENCOUNTER — Other Ambulatory Visit (HOSPITAL_BASED_OUTPATIENT_CLINIC_OR_DEPARTMENT_OTHER): Payer: Self-pay

## 2020-08-06 ENCOUNTER — Encounter (HOSPITAL_BASED_OUTPATIENT_CLINIC_OR_DEPARTMENT_OTHER): Payer: Self-pay | Admitting: Nurse Practitioner

## 2020-08-06 DIAGNOSIS — C182 Malignant neoplasm of ascending colon: Secondary | ICD-10-CM

## 2020-08-06 MED ORDER — CAPECITABINE 500 MG OR TABS
1000.0000 mg/m2 | ORAL_TABLET | Freq: Two times a day (BID) | ORAL | 3 refills | Status: DC
Start: 2020-08-06 — End: 2020-09-20
  Filled 2020-08-06 – 2020-08-30 (×2): qty 84, 21d supply, fill #0

## 2020-08-20 ENCOUNTER — Other Ambulatory Visit (HOSPITAL_BASED_OUTPATIENT_CLINIC_OR_DEPARTMENT_OTHER): Payer: Self-pay

## 2020-08-23 ENCOUNTER — Encounter (HOSPITAL_BASED_OUTPATIENT_CLINIC_OR_DEPARTMENT_OTHER): Payer: Medicare Other | Admitting: Nurse Practitioner

## 2020-08-23 ENCOUNTER — Other Ambulatory Visit (HOSPITAL_BASED_OUTPATIENT_CLINIC_OR_DEPARTMENT_OTHER): Payer: Medicare Other

## 2020-08-26 NOTE — Progress Notes (Signed)
[facility]   GI [place] NOTE    ONCOLOGY CARE TEAM  Patient Care Team:  Jennye Boroughs, MD as Oncologist (Medical Oncology)  Manuela Schwartz, MD as Oncologist (Endocrinology)    IDENTIFICATION / ASSESSMENT  Olivia Mcconnell is a 79 year old female with a history of ascending colon adenocarcinoma who is now s/p R hemicolectomy on 04/16/20. She presents for clinical evaluation prior to continuing capecitabine monotherapy.    CURRENT TREATMENT  Treatment goal Curative   Plan Name OP Capecitabine (Monotherapy)   Start Date 06/01/2020   End Date 10/11/2020 (Planned)       ONCOLOGIC HISTORY  1          .Presented with anemia, SOB. 6//21 Colonoscopy: mass in cecum inv IC valve. 04/06/20 CT CAP: 7.3 x 6.7 cecal mass inv TI, local LN to 21 x 14 x 88m, no distant mets. CEA 2.5.  2.         04/16/20. R hemicolectomy ([facility]. pT3N2a. 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         06/01/20 - present. Adjuvant capecitabine. C/b fatigue    INTERVAL EVENTS  Here today with her son.   [place]ent to [place] vacation [place] last [place], her 3 sons surprised her there, she had a nice time. She has been feeling tired. More lethargic 10-20%. She does have a fitness trainer she's going to start working with tomorrow. Her feet are more tender when she's walking at home. Her legs ache a little bit.     ROS  Denies fever, chills.  Denies mucositis.  Denies changes in appetite, nausea, vomiting, constipation, or diarrhea.  Denies dysuria, blood in urine or stool.  Denies neuropathy.  Denies rash or skin changes.  Denies chest pain, cough, shortness of breath, or edema.  Complete review of systems negative except as noted in HPI/interval history.    PAST MEDICAL HISTORY  Past Medical History:   Diagnosis Date    [place]    Breast mass 1990    Removed    Anemia     Little    Colon cancer ([facility]      Past Surgical  History:   Procedure Laterality Date    COLONOSCOPY  04/01/2020    PR APPENDECTOMY  1990s    PR UNLISTED PROCEDURE BREAST Right 1990    THYROID SURGERY  2011    total thyroidectomy    TOTAL KNEE ARTHROPLASTY Left 09/2014        FAMILY HISTORY  Family History     Problem (# of Occurrences) Relation (Name,Age of Onset)    Breast Cancer (1) Sister    Kidney Cancer (1) Brother    Rheumatoid Arthritis (1) Father          SOCIAL HISTORY  [place] her two sons ([name]. [facility] Never smoker, occasional alcohol.    ALLERGIES  Review of patient's allergies indicates:  Allergies   Allergen Reactions    Ciprofloxacin Unknown    Sulfa Antibiotics Unknown       HOME MEDICATIONS   Current Outpatient Medications:     acetaminophen 500 MG tablet, Take 2 tablets (1,000 mg) by mouth every 6 hours as needed for pain or  severe pain., Disp: 40 tablet, Rfl: 0    amLODIPine 10 MG tablet, Take 1 tablet (10 mg) by mouth daily., Disp: 7 tablet, Rfl: 0    capecitabine 500 MG tablet, Take 3 tablets (1,500 mg) by mouth 2 times a day on days 1 to 14.  Repeat every 21 days. Take within 30 mins after food., Disp: 84 tablet, Rfl: 3    Ferrous Sulfate (iron) 325 (65 Fe) MG tablet, Take 1 tablet (325 mg) by mouth daily. (Patient taking differently: Take 325 mg by mouth daily. Takes once/month), Disp: 7 tablet, Rfl: 0    levothyroxine 137 MCG tablet, Take 1 tablet (137 mcg) by mouth daily on an empty stomach., Disp: 7 tablet, Rfl: 0    loperamide 2 MG tablet, Take 2 tablets by mouth with first episode of diarrhea, then 1 tablet with every loose stool until no diarrhea for 12 hours. Max 8 tablets daily., Disp: 60 tablet, Rfl: 3    ondansetron 8 MG tablet, Take 1 tablet (8 mg) by mouth every 8 hours as needed for nausea/vomiting., Disp: 60 tablet, Rfl: 11    prochlorperazine 10 MG tablet, Take 1 tablet (10 mg) by  mouth every 6 hours as needed for nausea/vomiting., Disp: 60 tablet, Rfl: 11    urea 20 % cream, Apply topically to hands and feet 3 times a day as needed for dry skin., Disp: 480 g, Rfl: 3     ECOG PERFORMANCE STATUS  ECOG: (2) Ambulatory and capable of self care, unable to carry out work activity, up and about > 50% or waking hours     PHYSICAL EXAMINATION  VITALS: see flowsheet  GENERAL: In no acute distress.  HEENT: Normocephalic, atraumatic, sclera anicteric and without erythema, without nasal drainage, oropharynx moist and clear without erythema, dentition intact.   RESPIRATORY: Lungs clear to auscultation bilaterally anteriorly and posteriorly with no wheezing, crackles, or rhonchi.  CARDIOVASCULAR:  Regular rate and rhythm. No murmurs, gallops or rubs. Warm, well perfused, no cyanosis. No lower extremity edema.  ABDOMEN:  Soft, nontender, nondistended. No rebound tenderness or guarding. Bowel sounds normoactive. Tenderness in LLQ on palpation.   NEURO:  Alert and oriented x3. No focal neurological deficits.  MSK: Normal gait/stance. Ambulates independently with walker.  SKIN:  No rash, jaundice, petechia, bruising, or ulcer. Faint bilateral palmar erythema.   PSYCH:  Appropriate affect.    LABORATORY STUDIES  Lab on 08/30/20   1. CBC, Abs Neutrophil   Result Value Ref Range    WBC 7.96 4.3 - 10.0 10*3/uL    RBC 5.07 (H) 3.80 - 5.00 10*6/uL    Hemoglobin 15.2 11.5 - 15.5 g/dL    Hematocrit 44 36 - 45 %    MCV 86 81 - 98 fL    MCH 30.0 27.3 - 33.6 pg    MCHC 34.7 32.2 - 36.5 g/dL    Platelet Count 227 150 - 400 10*3/uL    RDW-CV 22.2 (H) 11.6 - 14.4 %    Absolute Neutrophil Comment Comment not required     Neutrophils 5.55 1.80 - 7.00 10*3/uL    Immature Granulocytes 0.02 0.00 - 0.05 10*3/uL    RBC Morphology See RBC data     Platelet Morphology See PLT count     WBC Morphology See Diff    2. Comprehensive Metabolic Panel   Result Value Ref Range    Sodium 138 135 - 145 meq/L    Potassium 4.0 3.6 - 5.2 meq/L  Chloride 103 98 - 108 meq/L    Carbon Dioxide, Total 30 22 - 32 meq/L    Anion Gap 5 4 - 12    Glucose 113 62 - 125 mg/dL    Urea Nitrogen 16 8 - 21 mg/dL    Creatinine 0.74 0.38 - 1.02 mg/dL    Protein (Total) 7.2 6.0 - 8.2 g/dL    Albumin 4.6 3.5 - 5.2 g/dL    Bilirubin (Total) 1.4 (H) 0.2 - 1.3 mg/dL    Calcium 9.6 8.9 - 10.2 mg/dL    AST (GOT) 15 9 - 38 U/L    Alkaline Phosphatase (Total) 84 49 - 199 U/L    ALT (GPT) 16 7 - 33 U/L    eGFR, Calculated >60 >59 mL/min/[1.73_m2]    GFR, Information       Calculated GFR by CKD-EPI equation. Inaccurate with changing renal function. See [email or web address]://[email or web address].html.       Recent Labs     04/09/20  1611   CEA 2.5       IMAGING  No new imaging.     PLAN:  1) Colon cancer.    *Treatment. Based on history, labs, and physical exam, the patient is appropriate for treatment today. Will give C 5 D 1 of capecitabine today.  - plan for 6 months of treatment, until ~ February 2022  - capecitabine dose reduced today to '1000mg'$  in the AM and '1500mg'$  in the PM, days 1-14 q21 days, for HFS    2) Fatigue. Treatment related. Worsened this last cycle. Improves in her off week but will opt for dose reduction also regarding HFS. Encouraged daily exercise and working on endurance.     3) Hand food syndrome. Increase bilateral soles tenderness with ambulation. Will dose reduce by one pill today. Start urea 20% cream to bilateral hands and feet several times daily. Continue to monitor.    4) Hyperbilirubinemia. 1.4 today. Isolated elevation. No abdominal symptoms. No jaundice. Discussed with Dr. Patrice Paradise. Continue to monitor. Patient to call if she has increased abdominal discomfort or nausea or notices jaundice.    Return to clinic in 3 weeks. Patient instructed to call clinic in the interim with questions or concerns.

## 2020-08-30 ENCOUNTER — Ambulatory Visit (HOSPITAL_BASED_OUTPATIENT_CLINIC_OR_DEPARTMENT_OTHER): Payer: Medicare Other | Admitting: Nurse Practitioner

## 2020-08-30 ENCOUNTER — Other Ambulatory Visit (HOSPITAL_BASED_OUTPATIENT_CLINIC_OR_DEPARTMENT_OTHER): Payer: Self-pay

## 2020-08-30 ENCOUNTER — Ambulatory Visit: Payer: Medicare Other | Attending: Hematology & Oncology

## 2020-08-30 VITALS — BP 155/89 | HR 68 | Temp 98.4°F | Resp 16 | Wt 140.9 lb

## 2020-08-30 DIAGNOSIS — L271 Localized skin eruption due to drugs and medicaments taken internally: Secondary | ICD-10-CM | POA: Insufficient documentation

## 2020-08-30 DIAGNOSIS — R5383 Other fatigue: Secondary | ICD-10-CM | POA: Insufficient documentation

## 2020-08-30 DIAGNOSIS — C182 Malignant neoplasm of ascending colon: Secondary | ICD-10-CM | POA: Insufficient documentation

## 2020-08-30 LAB — COMPREHENSIVE METABOLIC PANEL
ALT (GPT): 16 U/L (ref 7–33)
AST (GOT): 15 U/L (ref 9–38)
Albumin: 4.6 g/dL (ref 3.5–5.2)
Alkaline Phosphatase (Total): 84 U/L (ref 49–199)
Anion Gap: 5 (ref 4–12)
Bilirubin (Total): 1.4 mg/dL — ABNORMAL HIGH (ref 0.2–1.3)
Calcium: 9.6 mg/dL (ref 8.9–10.2)
Carbon Dioxide, Total: 30 meq/L (ref 22–32)
Chloride: 103 meq/L (ref 98–108)
Creatinine: 0.74 mg/dL (ref 0.38–1.02)
Glucose: 113 mg/dL (ref 62–125)
Potassium: 4 meq/L (ref 3.6–5.2)
Protein (Total): 7.2 g/dL (ref 6.0–8.2)
Sodium: 138 meq/L (ref 135–145)
Urea Nitrogen: 16 mg/dL (ref 8–21)
eGFR by CKD-EPI: >60 mL/min/{1.73_m2} (ref >59)

## 2020-08-30 LAB — CBC, ABS NEUTROPHIL
Hematocrit: 44 % (ref 36–45)
Hemoglobin: 15.2 g/dL (ref 11.5–15.5)
Immature Granulocytes: 0.02 10*3/uL (ref 0.00–0.05)
MCH: 30 pg (ref 27.3–33.6)
MCHC: 34.7 g/dL (ref 32.2–36.5)
MCV: 86 fL (ref 81–98)
Neutrophils: 5.55 10*3/uL (ref 1.80–7.00)
Platelet Count: 227 10*3/uL (ref 150–400)
RBC: 5.07 10*6/uL — ABNORMAL HIGH (ref 3.80–5.00)
RDW-CV: 22.2 % — ABNORMAL HIGH (ref 11.6–14.4)
WBC: 7.96 10*3/uL (ref 4.3–10.0)

## 2020-08-30 MED ORDER — UREA 20 % EX CREA
TOPICAL_CREAM | Freq: Three times a day (TID) | CUTANEOUS | 3 refills | Status: DC | PRN
  Filled 2020-08-30: qty 85, 10d supply, fill #0
  Filled 2020-09-20: qty 480, 30d supply, fill #0
  Filled 2020-09-20 (×2): qty 510, 30d supply, fill #0

## 2020-08-31 ENCOUNTER — Other Ambulatory Visit (HOSPITAL_BASED_OUTPATIENT_CLINIC_OR_DEPARTMENT_OTHER): Payer: Self-pay

## 2020-09-14 ENCOUNTER — Other Ambulatory Visit (HOSPITAL_BASED_OUTPATIENT_CLINIC_OR_DEPARTMENT_OTHER): Payer: Self-pay

## 2020-09-14 ENCOUNTER — Encounter (HOSPITAL_BASED_OUTPATIENT_CLINIC_OR_DEPARTMENT_OTHER): Payer: Medicare Other | Admitting: Nurse Practitioner

## 2020-09-14 ENCOUNTER — Other Ambulatory Visit (HOSPITAL_BASED_OUTPATIENT_CLINIC_OR_DEPARTMENT_OTHER): Payer: Medicare Other

## 2020-09-14 NOTE — Progress Notes (Signed)
[facility]   GI [place] NOTE    ONCOLOGY CARE TEAM  Patient Care Team:  Jennye Boroughs, MD as Oncologist (Medical Oncology)  Manuela Schwartz, MD as Oncologist (Endocrinology)    IDENTIFICATION / ASSESSMENT  Olivia Mcconnell is a 79 year old female with a history of ascending colon adenocarcinoma who is now s/p R hemicolectomy on 04/16/20. She presents for clinical evaluation prior to continuing capecitabine monotherapy.    CURRENT TREATMENT  Treatment goal Curative   Plan Name OP Capecitabine (Monotherapy)   Start Date 06/01/2020   End Date 11/01/2020 (Planned)       ONCOLOGIC HISTORY  1          .Presented with anemia, SOB. 6//21 Colonoscopy: mass in cecum inv IC valve. 04/06/20 CT CAP: 7.3 x 6.7 cecal mass inv TI, local LN to 21 x 14 x 1m, no distant mets. CEA 2.5.  2.         04/16/20. R hemicolectomy ([facility]. pT3N2a. 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         06/01/20  present. Adjuvant capecitabine. C/b fatigue    INTERVAL EVENTS  Here today with her son. She reports today is bad, she has fatigue and exhaustion. On Saturday and Sunday she was fatigued as well, but nothing like today. She started her capecitabine 6 days ago. She thinks she took 3 pills this morning. No pain. Some loose stools but manageable.    ROS  Denies fever, chills.  Denies mucositis.  Denies changes in appetite, nausea, vomiting, constipation.  Denies dysuria, blood in urine or stool.  Denies neuropathy.  Denies rash or skin changes.  Denies chest pain, cough, shortness of breath, or edema.  Complete review of systems negative except as noted in HPI/interval history.    PAST MEDICAL HISTORY  Past Medical History:   Diagnosis Date    Thyroid cancer ([place] 2011    Breast mass 1990    Removed    Anemia     Little    Col[facility]      Past Surgical History:   Procedure Laterality Date    COLONOSCOPY  04/01/2020    PR  APPENDECTOMY  1990s    PR UNLISTED PROCEDURE BREAST Right 1990    THYROID SURGERY  2011    total thyroidectomy    TOTAL KNEE ARTHROPLASTY Left 09/2014        FAMILY HISTORY  Family History     Problem (# of Occurrences) Relation (Name,Age of Onset)    Breast Cancer (1) Sister    Kidney Cancer (1) Brother    Rheumatoid Arthritis (1) Father        SOCIAL HISTORY  [place] her two sons ([name]. [facility] Never smoker, occasional alcohol.    ALLERGIES  Review of patient's allergies indicates:  Allergies   Allergen Reactions    Ciprofloxacin Unknown    Sulfa Antibiotics Unknown       HOME MEDICATIONS   Current Outpatient Medications:     acetaminophen 500 MG tablet, Take 2 tablets (1,000 mg) by mouth every 6 hours as needed for pain or severe pain., Disp: 40 tablet, Rfl: 0    amLODIPine 10 MG tablet, Take 1 tablet (10 mg) by mouth  daily., Disp: 7 tablet, Rfl: 0    capecitabine 500 MG tablet, Take '1000mg'$  (2 pills) twice a day on days 1-14, every 21 days.., Disp: 56 tablet, Rfl: 2    Ferrous Sulfate (iron) 325 (65 Fe) MG tablet, Take 1 tablet (325 mg) by mouth daily. (Patient taking differently: Take 325 mg by mouth daily. Takes once/month), Disp: 7 tablet, Rfl: 0    levothyroxine 137 MCG tablet, Take 1 tablet (137 mcg) by mouth daily on an empty stomach., Disp: 7 tablet, Rfl: 0    loperamide 2 MG tablet, Take 2 tablets by mouth with first episode of diarrhea, then 1 tablet with every loose stool until no diarrhea for 12 hours. Max 8 tablets daily., Disp: 60 tablet, Rfl: 3    ondansetron 8 MG tablet, Take 1 tablet (8 mg) by mouth every 8 hours as needed for nausea/vomiting., Disp: 60 tablet, Rfl: 11    prochlorperazine 10 MG tablet, Take 1 tablet (10 mg) by mouth every 6 hours as needed for nausea/vomiting., Disp: 60 tablet, Rfl: 11    urea 20 % cream, Apply topically to hands and feet  3 times a day as needed for dry skin., Disp: 480 g, Rfl: 3     ECOG PERFORMANCE STATUS  ECOG: (2) Ambulatory and capable of self care, unable to carry out work activity, up and about > 50% or waking hours     PHYSICAL EXAMINATION  VITALS: see flowsheet  GENERAL: In no acute distress.  HEENT: Normocephalic, atraumatic, sclera anicteric and without erythema, without nasal drainage, oropharynx moist and clear without erythema, dentition intact.   RESPIRATORY: Lungs clear to auscultation bilaterally anteriorly and posteriorly with no wheezing, crackles, or rhonchi.  CARDIOVASCULAR:  Regular rate and rhythm. No murmurs, gallops or rubs. Warm, well perfused, no cyanosis. No lower extremity edema.  ABDOMEN:  Soft, nontender, nondistended. No rebound tenderness or guarding. Bowel sounds normoactive. Surgical scar healed.  NEURO:  Alert and oriented x3. No focal neurological deficits.  MSK: Normal gait/stance. Ambulates independently with walker.  SKIN:  No rash, jaundice, petechia, bruising, or ulcer. Faint bilateral palmar erythema.   PSYCH:  Appropriate affect.    LABORATORY STUDIES  Lab on 09/20/20   1. CBC, Abs Neutrophil   Result Value Ref Range    WBC 6.00 4.3 - 10.0 10*3/uL    RBC 4.69 3.80 - 5.00 10*6/uL    Hemoglobin 15.0 11.5 - 15.5 g/dL    Hematocrit 43 36.0 - 45.0 %    MCV 91 81 - 98 fL    MCH 32.0 27.3 - 33.6 pg    MCHC 35.0 32.2 - 36.5 g/dL    Platelet Count 225 150 - 400 10*3/uL    RDW-CV 19.7 (H) 11.6 - 14.4 %    Absolute Neutrophil Comment Comment not required     Neutrophils 3.96 1.80 - 7.00 10*3/uL    Immature Granulocytes 0.02 0.00 - 0.05 10*3/uL    RBC Morphology See RBC data     Platelet Morphology See PLT count     WBC Morphology See Diff    2. Comprehensive Metabolic Panel   Result Value Ref Range    Sodium 140 135 - 145 meq/L    Potassium 3.7 3.6 - 5.2 meq/L    Chloride 105 98 - 108 meq/L    Carbon Dioxide, Total 27 22 - 32 meq/L    Anion Gap 8 4 - 12    Glucose 143 (H) 62 - 125 mg/dL  Urea  Nitrogen 11 8 - 21 mg/dL    Creatinine 0.66 0.38 - 1.02 mg/dL    Protein (Total) 7.1 6.0 - 8.2 g/dL    Albumin 4.5 3.5 - 5.2 g/dL    Bilirubin (Total) 1.2 0.2 - 1.3 mg/dL    Calcium 9.3 8.9 - 10.2 mg/dL    AST (GOT) 15 9 - 38 U/L    Alkaline Phosphatase (Total) 79 49 - 199 U/L    ALT (GPT) 16 7 - 33 U/L    eGFR, Calculated >60 >59 mL/min/[1.73_m2]    GFR, Information       Calculated GFR by CKD-EPI equation. Inaccurate with changing renal function. See [email or web address].html.   3. Carcinoembryonic Antigen   Result Value Ref Range    Carcinoembryonic Antigen 3.4 0.0 - 5.0 ng/mL       Recent Labs     09/20/20  1238 04/09/20  1611   CEA 3.4 2.5       IMAGING  No new imaging.     PLAN:  1) Colon cancer.    *Treatment. Based on history, labs, and physical exam, the patient is appropriate for treatment today. Will continue capecitabine today.  - she struggles with stopping treatment due to fatigue versus continuing therapy  - plan for 6 months of treatment, until ~ February 2022  - capecitabine dose reduced today to '1000mg'$  BID days 1-14 q21 days, for fatigue.    2) Fatigue. Treatment related. Worsened. Improves in her off week but will opt for dose reduction.     3) Hand food syndrome. Improved with prior dose reduction. Can start urea 20% cream to bilateral hands and feet several times daily. Continue to monitor.    4) Hyperbilirubinemia. 1.2 today. Resolved. Continue to monitor.    Return to clinic in 3 weeks. Patient instructed to call clinic in the interim with questions or concerns.

## 2020-09-20 ENCOUNTER — Other Ambulatory Visit (HOSPITAL_BASED_OUTPATIENT_CLINIC_OR_DEPARTMENT_OTHER): Payer: Self-pay

## 2020-09-20 ENCOUNTER — Ambulatory Visit: Payer: Medicare Other | Attending: Internal Medicine

## 2020-09-20 ENCOUNTER — Ambulatory Visit (HOSPITAL_BASED_OUTPATIENT_CLINIC_OR_DEPARTMENT_OTHER): Payer: Medicare Other | Admitting: Nurse Practitioner

## 2020-09-20 VITALS — BP 129/78 | HR 63 | Temp 98.1°F | Resp 18

## 2020-09-20 DIAGNOSIS — C182 Malignant neoplasm of ascending colon: Secondary | ICD-10-CM

## 2020-09-20 DIAGNOSIS — R5383 Other fatigue: Secondary | ICD-10-CM | POA: Insufficient documentation

## 2020-09-20 DIAGNOSIS — T451X5D Adverse effect of antineoplastic and immunosuppressive drugs, subsequent encounter: Secondary | ICD-10-CM | POA: Insufficient documentation

## 2020-09-20 DIAGNOSIS — L271 Localized skin eruption due to drugs and medicaments taken internally: Secondary | ICD-10-CM | POA: Insufficient documentation

## 2020-09-20 LAB — COMPREHENSIVE METABOLIC PANEL
ALT (GPT): 16 U/L (ref 7–33)
AST (GOT): 15 U/L (ref 9–38)
Albumin: 4.5 g/dL (ref 3.5–5.2)
Alkaline Phosphatase (Total): 79 U/L (ref 49–199)
Anion Gap: 8 (ref 4–12)
Bilirubin (Total): 1.2 mg/dL (ref 0.2–1.3)
Calcium: 9.3 mg/dL (ref 8.9–10.2)
Carbon Dioxide, Total: 27 meq/L (ref 22–32)
Chloride: 105 meq/L (ref 98–108)
Creatinine: 0.66 mg/dL (ref 0.38–1.02)
Glucose: 143 mg/dL — ABNORMAL HIGH (ref 62–125)
Potassium: 3.7 meq/L (ref 3.6–5.2)
Protein (Total): 7.1 g/dL (ref 6.0–8.2)
Sodium: 140 meq/L (ref 135–145)
Urea Nitrogen: 11 mg/dL (ref 8–21)
eGFR by CKD-EPI: >60 mL/min/{1.73_m2} (ref >59)

## 2020-09-20 LAB — CARCINOEMBRYONIC ANTIGEN: Carcinoembryonic Antigen: 3.4 ng/mL (ref 0.0–5.0)

## 2020-09-20 LAB — CBC, ABS NEUTROPHIL
Hematocrit: 43 % (ref 36.0–45.0)
Hemoglobin: 15 g/dL (ref 11.5–15.5)
Immature Granulocytes: 0.02 10*3/uL (ref 0.00–0.05)
MCH: 32 pg (ref 27.3–33.6)
MCHC: 35 g/dL (ref 32.2–36.5)
MCV: 91 fL (ref 81–98)
Neutrophils: 3.96 10*3/uL (ref 1.80–7.00)
Platelet Count: 225 10*3/uL (ref 150–400)
RBC: 4.69 10*6/uL (ref 3.80–5.00)
RDW-CV: 19.7 % — ABNORMAL HIGH (ref 11.6–14.4)
WBC: 6 10*3/uL (ref 4.3–10.0)

## 2020-09-20 MED ORDER — CAPECITABINE 500 MG OR TABS
1000.0000 mg | ORAL_TABLET | Freq: Two times a day (BID) | ORAL | 2 refills | Status: DC
Start: 2020-09-20 — End: 2021-01-31
  Filled 2020-09-20: qty 56, 21d supply, fill #0

## 2020-09-20 NOTE — Progress Notes (Signed)
[facility] General Oncology Clinical Pharmacist ORAL Chemotherapy Verification     Indication: Cancer of ascending colon ([place]), Stage III     Treatment Plan: OP Capecitabine (Monotherapy)      Dose: Capecitabine 1000 mg PO BID on Days 1 to 14 every 21 days  Dose rounded per MD for patient age.  12/13 Dose reduction for patient tolerability      Cycle: Refill  Adjuvant, 6 months planned     Monitoring:   CrCl ~ 60 mL/min (BW = 62.3 kg, Scr rounded to 0.7 mg/dL). No dose adjustment recommended for renal function.     DDIs: Reviewed. Identified the following DDIs:  Recommend holding Saccharomyces boulardii probiotic while on chemotherapy due to risk of infection while neutropenic (obtained from outside medical record, prescribed in s/o C. difficile infection?)     Other:   Holding oxaliplatin for age, patient preference to limit toxicity     Reference:   NCCN Guidelines. "Colon Cancer." Ver 2.2021.     Melodye Ped, PharmD

## 2020-09-20 NOTE — Addendum Note (Signed)
Addended by: David Stall on: 09/20/2020 02:54 PM     Modules accepted: Level of Service

## 2020-09-23 ENCOUNTER — Other Ambulatory Visit (HOSPITAL_BASED_OUTPATIENT_CLINIC_OR_DEPARTMENT_OTHER): Payer: Self-pay | Admitting: Nurse Practitioner

## 2020-10-04 ENCOUNTER — Encounter (HOSPITAL_BASED_OUTPATIENT_CLINIC_OR_DEPARTMENT_OTHER): Payer: Medicare Other | Admitting: Hematology & Oncology

## 2020-10-04 ENCOUNTER — Other Ambulatory Visit (HOSPITAL_BASED_OUTPATIENT_CLINIC_OR_DEPARTMENT_OTHER): Payer: Medicare Other

## 2020-10-11 ENCOUNTER — Ambulatory Visit (HOSPITAL_BASED_OUTPATIENT_CLINIC_OR_DEPARTMENT_OTHER): Payer: Medicare Other

## 2020-10-11 ENCOUNTER — Ambulatory Visit: Payer: Medicare Other | Attending: Hematology & Oncology | Admitting: Hematology & Oncology

## 2020-10-11 DIAGNOSIS — R5383 Other fatigue: Secondary | ICD-10-CM | POA: Insufficient documentation

## 2020-10-11 DIAGNOSIS — C182 Malignant neoplasm of ascending colon: Secondary | ICD-10-CM | POA: Insufficient documentation

## 2020-10-11 DIAGNOSIS — I10 Essential (primary) hypertension: Secondary | ICD-10-CM | POA: Insufficient documentation

## 2020-10-11 LAB — CBC, ABS NEUTROPHIL
Hematocrit: 43 % (ref 36.0–45.0)
Hemoglobin: 14.8 g/dL (ref 11.5–15.5)
Immature Granulocytes: 0.02 10*3/uL (ref 0.00–0.05)
MCH: 32.3 pg (ref 27.3–33.6)
MCHC: 34.8 g/dL (ref 32.2–36.5)
MCV: 93 fL (ref 81–98)
Neutrophils: 4.23 10*3/uL (ref 1.80–7.00)
Platelet Count: 223 10*3/uL (ref 150–400)
RBC: 4.58 10*6/uL (ref 3.80–5.00)
RDW-CV: 15 % — ABNORMAL HIGH (ref 11.6–14.4)
WBC: 6.7 10*3/uL (ref 4.3–10.0)

## 2020-10-11 LAB — COMPREHENSIVE METABOLIC PANEL
ALT (GPT): 31 U/L (ref 7–33)
AST (GOT): 20 U/L (ref 9–38)
Albumin: 4.5 g/dL (ref 3.5–5.2)
Alkaline Phosphatase (Total): 82 U/L (ref 49–199)
Anion Gap: 7 (ref 4–12)
Bilirubin (Total): 0.8 mg/dL (ref 0.2–1.3)
Calcium: 9.7 mg/dL (ref 8.9–10.2)
Carbon Dioxide, Total: 30 meq/L (ref 22–32)
Chloride: 104 meq/L (ref 98–108)
Creatinine: 0.58 mg/dL (ref 0.38–1.02)
Glucose: 110 mg/dL (ref 62–125)
Potassium: 4 meq/L (ref 3.6–5.2)
Protein (Total): 7.2 g/dL (ref 6.0–8.2)
Sodium: 141 meq/L (ref 135–145)
Urea Nitrogen: 20 mg/dL (ref 8–21)
eGFR by CKD-EPI: >60 mL/min/{1.73_m2} (ref >59)

## 2020-10-11 NOTE — Progress Notes (Signed)
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 Windell Norfolk) on 04/16/2020, currently on adjuvant capecitabine since 06/01/2020. Presents today for routine follow up.     GENETICS/MOLECULAR:  Microsatellite stable    CURRENT TREATMENT  Treatment goal Curative   Plan Name OP Capecitabine (Monotherapy)   Start Date 06/01/2020   End Date 11/01/2020 (Planned)       TREATMENT HISTORY  Oncology History Overview   04/01/2020: Colonoscopy done for iron deficiency anemia, shows obstructing cecal mass, path consistent with invasive adenocarcinoma   04/06/2020: CT chest/abdomen/pelvis shows primary cecal neoplasm measuring 7.3 x 6.7 x 5.7cm invading ileocecal valve and distal ileum, with transmural penetration of the tumor and local invasion into pericolonic fat. Local lymphadenopathy in the cecal mesentery and R ileocolonic mesentery, largest measuring 2.1 x 1.4 x 1.7cm. No distant metastatic disease.   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 PT3 pN2a   06/01/2020-09/27/2020: Adjuvant capecitabine            INTERVAL HISTORY  Olivia Mcconnell presents today with her son Olivia Mcconnell and 35 year old grandson Olivia Mcconnell for routine follow up. Despite dose reductions in capecitabine (currently on 1054m qam and 10066mqpm), she continues to feel very tired and low energy. She stopped capecitabine as of 09/27/2020, and is really hoping  to not go back on it. Since stopping the capecitabine, she does note that her energy is a little better and she is a little less fatigued; [name] notes this too. Notes that the color of her hands has changed, but she is not feeling any itching or tightening. Is now living with her son Olivia Mcconnell her group living [place]     REVIEW OF SYSTEMS  A complete review of systems was completed and negative unless documented above in HPI    HOME MEDICATIONS    Current Outpatient Medications:     acetaminophen 500 MG tablet, Take 2 tablets (1,000 mg) by mouth every 6 hours as needed for pain or severe pain., Disp: 40 tablet, Rfl: 0    amLODIPine 10 MG tablet, Take 1 tablet (10 mg) by mouth daily., Disp: 7 tablet, Rfl: 0    capecitabine 500 MG tablet, Take 2 tablets (1,000 mg) by mouth 2 times a day on days 1-14 every 21 days., Disp: 56 tablet, Rfl: 2    Ferrous Sulfate (iron) 325 (65 Fe) MG tablet, Take 1 tablet (325 mg) by mouth daily. (Patient taking differently: Take 325 mg by mouth daily. Takes once/month), Disp: 7 tablet, Rfl: 0    levothyroxine 137 MCG tablet, Take 1 tablet (137 mcg) by mouth daily on an empty stomach., Disp: 7 tablet, Rfl: 0    loperamide 2 MG tablet, Take 2 tablets by mouth with first episode of diarrhea, then 1 tablet with every loose stool until no diarrhea for 12 hours. 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 Never smoker, occasional alcohol    FAMILY HISTORY  Brother with kidney cancer   2 sisters with breast cancer    PHYSICAL EXAMINATION  VITAL SIGNS:   Vitals:    10/11/20 1507   BP: (!) 144/85   Pulse: 60   Resp: 16   Temp: 36.7 C   SpO2: 97%     GENERAL: Comfortable appearing, very pleasant woman in no acute distress  NEURO: Appropriate gait. Moving all extremities spontaneously   MSK: Normal bulk and tone in all extremities  SKIN: Both hands with slight erythema, no dryness or peeling noted.     ECOG: Self reported as 3    LABORATORY STUDIES  CBC:  Lab Results   Component Value Date/Time    WBC 6.70 10/11/2020 02:55 PM    HEMOGLOBIN 14.8 10/11/2020 02:55 PM    HEMATOCRIT 43 10/11/2020 02:55 PM    PLATELET 223 10/11/2020 02:55 PM    ANEUT 4.23 10/11/2020 19:62 PM     Metabolic panel:  Lab Results   Component Value Date/Time    POTASSIUM 4.0 10/11/2020 02:55 PM    CL 104 10/11/2020 02:55 PM    CO2 30 10/11/2020 02:55 PM    BUN 20 10/11/2020 02:55 PM    CREATININE 0.58 10/11/2020 02:55 PM    AST 20 10/11/2020 02:55 PM    ALT 31 10/11/2020 02:55 PM    ALK 82 10/11/2020 02:55 PM    BILIRUBN 0.8 10/11/2020 02:55 PM    ALBUMIN 4.5 10/11/2020 02:55 PM    PROTEIN 7.2 10/11/2020 02:55 PM     Tumor Markers:  No results found for: CA199    Lab Results   Component Value Date/Time    CEA 3.4 09/20/2020 12:38 PM    CEA 2.5 04/09/2020 04:11 PM     ASSESSMENT/PLAN  Olivia Mcconnell is a 80 year old femalewith pT3N2a (stage IIIa), 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 Windell Norfolk) on 04/16/2020, currently on adjuvant capecitabine since 06/01/2020. Presents today for routine follow up.    #Stage IIIa  colon adenocarcinoma--S/p surgical resection as above, on adjuvant capecitabine for recurrence risk reduction. Ms. Larkin has completed about 3.5 months of adjuvant capecitabine treatment, and has overall found it harder and harder to tolerate despite dose reductions. She is experiencing fatigue and low energy, and self-rates her ECOG as a 3. Given her ongoing side effects, she has opted to stop adjuvant treatment, which is perfectly reasonable. She feels comfortable with this decision, as does her son Olivia Mcconnell. Moving forward, we will plan to get  a baseline CT scan of the chest/abdomen/pelvis for comparison when we begin surveillance. We will see her back later this month to review this CT scan, which she can get non urgently, and then in 3 months with a CEA and follow up to begin surveillance.   -Will discontinue capecitabine   -CT chest/abdomen/pelvis at some point this month for a baseline, will see her back to review this scan  -After baseline scan, follow up in 3 months for visit and CEA     FOLLOW UP  Return to clinic in 2-3 weeks to discuss CT scan     Patient was seen and discussed with attending oncologist Dr. Malka So, MD  Fellow, Hematology/Oncology   [facility]

## 2020-10-16 ENCOUNTER — Ambulatory Visit
Admission: RE | Admit: 2020-10-16 | Discharge: 2020-10-16 | Disposition: A | Discharge status: Home / Self Care | Payer: Medicare Other | Attending: Diagnostic Radiology | Admitting: Diagnostic Radiology

## 2020-10-16 DIAGNOSIS — C182 Malignant neoplasm of ascending colon: Secondary | ICD-10-CM | POA: Insufficient documentation

## 2020-10-16 MED ORDER — IOHEXOL 350 MG/ML IV SOLN
96.0000 mL | Freq: Once | INTRAVENOUS | Status: AC | PRN
Start: 2020-10-16 — End: 2020-10-16
  Administered 2020-10-16: 96 mL via INTRAVENOUS

## 2020-10-18 ENCOUNTER — Ambulatory Visit: Payer: Medicare Other | Attending: Hematology & Oncology | Admitting: Hematology & Oncology

## 2020-10-18 DIAGNOSIS — Z800 Family history of malignant neoplasm of digestive organs: Secondary | ICD-10-CM | POA: Insufficient documentation

## 2020-10-18 DIAGNOSIS — Z853 Personal history of malignant neoplasm of breast: Secondary | ICD-10-CM | POA: Insufficient documentation

## 2020-10-18 DIAGNOSIS — C182 Malignant neoplasm of ascending colon: Secondary | ICD-10-CM | POA: Insufficient documentation

## 2020-10-18 DIAGNOSIS — Z803 Family history of malignant neoplasm of breast: Secondary | ICD-10-CM | POA: Insufficient documentation

## 2020-10-18 DIAGNOSIS — I81 Portal vein thrombosis: Secondary | ICD-10-CM | POA: Insufficient documentation

## 2020-10-18 NOTE — Progress Notes (Signed)
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 Windell Norfolk) on 04/16/2020, s/p about 4 months of adjuvant capecitabine 05/2020-09/2020. Presents today for end of treatment scan review.     GENETICS/MOLECULAR:  Microsatellite stable    CURRENT TREATMENT  Treatment goal Curative   Plan Name OP Capecitabine (Monotherapy)   Start Date 06/01/2020   End Date 11/01/2020 (Planned)     TREATMENT HISTORY  Oncology History Overview   04/01/2020: Colonoscopy done for iron deficiency anemia, shows obstructing cecal mass, path consistent with invasive adenocarcinoma   04/06/2020: CT chest/abdomen/pelvis shows primary cecal neoplasm measuring 7.3 x 6.7 x 5.7cm invading ileocecal valve and distal ileum, with transmural penetration of the tumor and local invasion into pericolonic fat. Local lymphadenopathy in the cecal mesentery and R ileocolonic mesentery, largest measuring 2.1 x 1.4 x 1.7cm. No distant metastatic disease.   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 PT3 pN2a   06/01/2020-09/27/2020: Adjuvant capecitabine  01/085/2021: End of treatment CT scan with no evidence of disease, with new portal venous thrombi.             INTERVAL HISTORY  Olivia Mcconnell presents today with her son Olivia Mcconnell to review her recent end of treatment CT scan. She has been feeling well in the last week with no changes to her health. Was  quite tired after her CT scan on Saturday, and slept for most of the day after that. [name] notes that she seems happier after being off capecitabine, but is not necessarily less tired. Has not been having any abdominal pain, no cramping, or nausea/vomiting.     REVIEW OF SYSTEMS  A complete review of systems was completed and negative unless documented above in HPI    HOME MEDICATIONS    Current Outpatient Medications:     acetaminophen 500 MG tablet, Take 2 tablets (1,000 mg) by mouth every 6 hours as needed for pain or severe pain., Disp: 40 tablet, Rfl: 0    amLODIPine 10 MG tablet, Take 1 tablet (10 mg) by mouth daily., Disp: 7 tablet, Rfl: 0    capecitabine 500 MG tablet, Take 2 tablets (1,000 mg) by mouth 2 times a day on days 1-14 every 21 days., Disp: 56 tablet, Rfl: 2    Ferrous Sulfate (iron) 325 (65 Fe) MG tablet, Take 1 tablet (325 mg) by mouth daily. (Patient taking differently: Take 325 mg by mouth daily. Takes once/month), Disp: 7 tablet, Rfl: 0    levothyroxine 137 MCG tablet, Take 1 tablet (137 mcg) by mouth daily on an empty stomach., Disp: 7 tablet, Rfl: 0    loperamide 2 MG tablet, Take 2 tablets by mouth with first episode of diarrhea, then 1 tablet with every loose stool until no diarrhea for 12 hours. 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 Never smoker, occasional alcohol. Currently living with son Olivia Mcconnell and his family     FAMILY HISTORY  Brother with kidney cancer   2 sisters with breast cancer    PHYSICAL EXAMINATION  VITAL SIGNS:   Vitals:    10/18/20 1507   BP: (!) 146/84   Pulse: 65   Resp: 16   Temp: 36.5 C   SpO2: 98%      GENERAL: Comfortable appearing, very pleasant woman in no acute distress  NEURO: Appropriate gait. Moving all extremities spontaneously   MSK: Normal bulk and tone in all extremities  SKIN: Bruising from blood draws on L forearm     ECOG: Self reported as 3    LABORATORY STUDIES  CBC:  Lab Results   Component Value Date/Time    WBC 6.70 10/11/2020 02:55 PM    HEMOGLOBIN 14.8 10/11/2020 02:55 PM    HEMATOCRIT 43 10/11/2020 02:55 PM    PLATELET 223 10/11/2020 02:55 PM    ANEUT 4.23 10/11/2020 45:40 PM     Metabolic panel:  Lab Results   Component Value Date/Time    POTASSIUM 4.0 10/11/2020 02:55 PM    CL 104 10/11/2020 02:55 PM    CO2 30 10/11/2020 02:55 PM    BUN 20 10/11/2020 02:55 PM    CREATININE 0.58 10/11/2020 02:55 PM    AST 20 10/11/2020 02:55 PM    ALT 31 10/11/2020 02:55 PM    ALK 82 10/11/2020 02:55 PM    BILIRUBN 0.8 10/11/2020 02:55 PM    ALBUMIN 4.5 10/11/2020 02:55 PM    PROTEIN 7.2 10/11/2020 02:55 PM     Tumor Markers:  No results found for: CA199  Lab Results   Component Value Date/Time    CEA 3.4 09/20/2020 12:38 PM    CEA 2.5 04/09/2020 04:11 PM     No results found for: CHROMGRNA  No results found for: GASTRINRSLT    IMAGING:  CT chest/abdomen/pelvis 10/17/2019:  *  Status post right hemicolectomy without evidence of local recurrence.  *  Transient hepatic attenuation difference (THAD) of subsegments V and VIII with associated portal vein thrombi as detailed above.  *  Interval resolution of right mesenteric lymphadenopathy. No new lymphadenopathy or additional sites of metastasis detected.  *   Similar sub-6 mm pulmonary nodules as detailed above.  *  Mild anterior perivesical fat stranding. Consider correlation with urinalysis.    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 Windell Norfolk) on 04/16/2020, s/p about 4 months of adjuvant capecitabine 05/2020-09/2020. Presents today for end of treatment scan review.     #Stage IIIa colon adenocarcinoma--S/p surgical resection as  above, now s/p about 4 months of adjuvant capecitabine treatment, discontinued due to fatigue and poor quality of life despite dose reductions. Her end of treatment CT scan does not show any evidence of disease, at least in the abdomen/pelvis (was not protocolled for the chest) which we will use as a baseline for subsequent scans.   -Will start surveillance, with plans for provider visit with labs including CEA q3-6 months x 2 years, and CT chest/abdomen/pelvis q6-12 months x 2 years, will space out after 2 years.   -Next visit in 3 months with labs including CEA, will complete full CT chest/abdomen/pelvis at that time in 3 months to complete staging with the chest imaging, and also to evaluate the portal venous thrombosis, discussed below.     #Portal venous thrombosis--New on recent CT scan when compared to pre-colectomy CT scan. Unable to determine acuity based on imaging. At this time, this is not causing any symptoms, including abdominal pain, nausea/vomiting, and she does not have LFT abnormalities on most recent labs. At this time, it appears that the risks of anticoagulation outweigh the benefits, especially when considering the small size of the clot, no embolic potential, and the risks of anticoagulation in an 80 year old patient. Olivia Mcconnell agrees with this assessment, and would also like to avoid anticoagulation if possible. We will re-evaluate the  thrombi with a CT in 3 months, and encouraged Mr. Cordon to call us if any questions or symptoms arise.   -Asymptomatic, risks of anticoagulation outweigh benefits  -Will re-evaluate on repeat CT chest/abdomen/pelvis in 3 months     FOLLOW UP  Return in 3 months for labs, scan review and clinical follow up.     Patient was seen and discussed with attending oncologist Dr. Malka So, MD  Fellow, Hematology/Oncology   [place] [facility] [place]

## 2020-11-01 ENCOUNTER — Other Ambulatory Visit (HOSPITAL_BASED_OUTPATIENT_CLINIC_OR_DEPARTMENT_OTHER): Payer: Self-pay

## 2020-11-01 ENCOUNTER — Encounter (HOSPITAL_BASED_OUTPATIENT_CLINIC_OR_DEPARTMENT_OTHER): Payer: Self-pay | Admitting: Medical

## 2020-11-03 ENCOUNTER — Other Ambulatory Visit (HOSPITAL_COMMUNITY): Payer: Self-pay | Admitting: Diagnostic Radiology

## 2020-11-03 ENCOUNTER — Other Ambulatory Visit (HOSPITAL_BASED_OUTPATIENT_CLINIC_OR_DEPARTMENT_OTHER): Payer: Self-pay | Admitting: Student in an Organized Health Care Education/Training Program

## 2020-11-03 DIAGNOSIS — C182 Malignant neoplasm of ascending colon: Secondary | ICD-10-CM

## 2021-01-28 ENCOUNTER — Ambulatory Visit
Admission: RE | Admit: 2021-01-28 | Discharge: 2021-01-28 | Disposition: A | Discharge status: Home / Self Care | Payer: Medicare Other | Attending: Diagnostic Radiology | Admitting: Diagnostic Radiology

## 2021-01-28 ENCOUNTER — Other Ambulatory Visit (HOSPITAL_BASED_OUTPATIENT_CLINIC_OR_DEPARTMENT_OTHER): Payer: Self-pay

## 2021-01-28 DIAGNOSIS — C182 Malignant neoplasm of ascending colon: Secondary | ICD-10-CM | POA: Insufficient documentation

## 2021-01-28 LAB — CBC, DIFF
% Basophils: 1 %
% Eosinophils: 1 %
% Immature Granulocytes: 1 %
% Lymphocytes: 23 %
% Monocytes: 6 %
% Neutrophils: 68 %
Absolute Eosinophil Count: 0.08 10*3/uL (ref 0.00–0.50)
Absolute Lymphocyte Count: 1.8 10*3/uL (ref 1.00–4.80)
Basophils: 0.05 10*3/uL (ref 0.00–0.20)
Hematocrit: 43 % (ref 36.0–45.0)
Hemoglobin: 15 g/dL (ref 11.5–15.5)
Immature Granulocytes: 0.07 10*3/uL — ABNORMAL HIGH (ref 0.00–0.05)
MCH: 29.6 pg (ref 27.3–33.6)
MCHC: 34.6 g/dL (ref 32.2–36.5)
MCV: 86 fL (ref 81–98)
Monocytes: 0.47 10*3/uL (ref 0.00–0.80)
Neutrophils: 5.22 10*3/uL (ref 1.80–7.00)
Platelet Count: 216 10*3/uL (ref 150–400)
RBC: 5.07 10*6/uL — ABNORMAL HIGH (ref 3.80–5.00)
RDW-CV: 13.3 % (ref 11.6–14.4)
WBC: 7.69 10*3/uL (ref 4.3–10.0)

## 2021-01-28 LAB — COMPREHENSIVE METABOLIC PANEL
ALT (GPT): 19 U/L (ref 7–33)
AST (GOT): 22 U/L (ref 9–38)
Albumin: 4.3 g/dL (ref 3.5–5.2)
Alkaline Phosphatase (Total): 78 U/L (ref 49–199)
Anion Gap: 6 (ref 4–12)
Bilirubin (Total): 0.6 mg/dL (ref 0.2–1.3)
Calcium: 9.4 mg/dL (ref 8.9–10.2)
Carbon Dioxide, Total: 27 meq/L (ref 22–32)
Chloride: 104 meq/L (ref 98–108)
Creatinine: 0.67 mg/dL (ref 0.38–1.02)
Glucose: 105 mg/dL (ref 62–125)
Potassium: 4.3 meq/L (ref 3.6–5.2)
Protein (Total): 7 g/dL (ref 6.0–8.2)
Sodium: 137 meq/L (ref 135–145)
Urea Nitrogen: 14 mg/dL (ref 8–21)
eGFR by CKD-EPI: >60 mL/min/{1.73_m2} (ref >59)

## 2021-01-28 LAB — CARCINOEMBRYONIC ANTIGEN: Carcinoembryonic Antigen: 3.2 ng/mL (ref 0.0–5.0)

## 2021-01-28 MED ORDER — IOHEXOL 9 MG/ML OR SOLN
500.0000 mL | Freq: Once | ORAL | Status: AC | PRN
Start: 2021-01-28 — End: 2021-01-28
  Administered 2021-01-28: 500 mL via ORAL

## 2021-01-28 MED ORDER — IOHEXOL 350 MG/ML IV SOLN
96.0000 mL | Freq: Once | INTRAVENOUS | Status: AC | PRN
Start: 2021-01-28 — End: 2021-01-28
  Administered 2021-01-28: 96 mL via INTRAVENOUS

## 2021-01-28 NOTE — Progress Notes (Signed)
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 Windell Norfolk) on 04/16/2020, s/p about 4 months of adjuvant capecitabine 05/2020-09/2020. Presents today for routine follow up and scan review.     GENETICS/MOLECULAR:  Microsatellite stable     CURRENT TREATMENT  Treatment goal Curative   Plan Name OP Capecitabine (Monotherapy)   Start Date 06/01/2020   End Date 11/01/2020 (Planned)     TREATMENT HISTORY  Oncology History Overview   04/01/2020: Colonoscopy done for iron deficiency anemia, shows obstructing cecal mass, path consistent with invasive adenocarcinoma   04/06/2020: CT chest/abdomen/pelvis shows primary cecal neoplasm measuring 7.3 x 6.7 x 5.7cm invading ileocecal valve and distal ileum, with transmural penetration of the tumor and local invasion into pericolonic fat. Local lymphadenopathy in the cecal mesentery and R ileocolonic mesentery, largest measuring 2.1 x 1.4 x 1.7cm. No distant metastatic disease.   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 PT3 pN2a   06/01/2020-09/27/2020: Adjuvant capecitabine  10/14/2019: End of treatment CT scan with no evidence of disease, with new portal venous thrombi.   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             INTERVAL HISTORY  Olivia Mcconnell presents today with her son for routine follow up and scan review. She has been feeling well recently, and feels like she may be back to her normal prior to chemotherapy. Has moved back to [place] but still spends time with her sons, and stays at Olivia Mcconnell's occasionally. Does not note any concerning symptoms, including diarrhea, constipation, hematochezia, abdominal pain, nausea/vomiting, or poor appetite.     REVIEW OF SYSTEMS  A complete review of systems was completed and negative unless documented above in HPI    HOME MEDICATIONS    Current Outpatient Medications:     acetaminophen 500 MG tablet, Take 2 tablets (1,000 mg) by mouth every 6 hours as needed for pain or severe pain., Disp: 40 tablet, Rfl: 0    amLODIPine 10 MG tablet, Take 1 tablet (10 mg) by mouth daily., Disp: 7 tablet, Rfl: 0    capecitabine 500 MG tablet, Take 2 tablets (1,000 mg) by mouth 2 times a day on days 1-14 every 21 days., Disp: 56 tablet, Rfl: 2    Ferrous Sulfate (iron) 325 (65 Fe) MG tablet, Take 1 tablet (325 mg) by mouth daily. (Patient taking differently: Take 325 mg by mouth daily. Takes once/month), Disp: 7 tablet, Rfl: 0    levothyroxine 137 MCG tablet, Take 1 tablet (137 mcg) by mouth daily on an empty stomach., Disp: 7 tablet, Rfl: 0    loperamide 2 MG tablet, Take 2 tablets by mouth with first episode of diarrhea, then 1 tablet with every loose stool until no diarrhea for 12 hours. 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 Never smoker, occasional alcohol. Currently living with son Olivia Mcconnell and his family     FAMILY HISTORY  Brother with kidney cancer   2 sisters with breast cancer    PHYSICAL EXAMINATION  VITAL SIGNS:   Vitals:    01/31/21 1547   BP: 133/85   Pulse: 67   Resp: 16   Temp: 36.7 C   SpO2: 96%     GENERAL: Comfortable appearing, very pleasant woman in no acute distress  CHEST: Normal work of breathing  NEURO: Appropriate gait. Moving all extremities spontaneously   MSK: Normal bulk and tone in all extremities  SKIN: No apparent rashes or lesions    ECOG: 1    LABORATORY STUDIES  CBC:  Lab Results   Component Value Date/Time    WBC 7.69 01/28/2021 01:53 PM    HEMOGLOBIN 15.0 01/28/2021 01:53 PM    HEMATOCRIT 43 01/28/2021 01:53 PM    PLATELET 216 01/28/2021 01:53 PM    ANEUT 5.22 01/28/2021 92:42 PM     Metabolic panel:  Lab Results   Component Value Date/Time    POTASSIUM 4.3 01/28/2021 01:53 PM    CL 104 01/28/2021 01:53 PM    CO2 27 01/28/2021 01:53 PM    BUN 14 01/28/2021 01:53 PM    CREATININE 0.67 01/28/2021 01:53 PM    AST 22 01/28/2021 01:53 PM    ALT 19 01/28/2021 01:53 PM    ALK 78 01/28/2021 01:53 PM    BILIRUBN 0.6 01/28/2021 01:53 PM    ALBUMIN 4.3 01/28/2021 01:53 PM    PROTEIN 7.0 01/28/2021 01:53 PM     IMAGING:    CT chest/abdomen/pelvis 01/28/2021:  Patient with a history of colon cancer status post right hemicolectomy.   1. Stable indeterminate lung nodules compared to 04/06/2020.  2. No evidence of local recurrence or new sites of metastatic disease within the chest, abdomen or pelvis.  3. 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 Windell Norfolk) on 04/16/2020, s/p about 4 months of adjuvant capecitabine 05/2020-09/2020. Presents today for routine follow up and scan review.    #Stage IIIa colon adenocarcinoma--S/p surgical resection as above, s/p about 4 months of adjuvant capecitabine treatment, discontinued due to fatigue and poor quality of life despite dose reductions. 3 month post-treatment surveillance scan done today with no evidence of disease recurrence. CEA is stable at 3.2. She will be due for her 1 year post-surgery colonoscopy in July 2022, primarily to assess for new  polyp formation and evidence of disease as the anastomotic site. Tomi is willing to undergo this colonoscopy, will place order.   -Due for colonoscopy in 04/2021 as above, order placed   -Surveillance:   -MD visit + labs including CEA q3 months x 2 years (end 10/2022), next due 04/2021, orders placed    -CT chest/abdomen/pelvis annually x2 years (end 10/2022), space out after 2 years. Next due 04/2021, orders placed.   -Also discussed incorporation of baby aspirin to prevent new polyp formation, Lira will consider    #Portal venous thrombosis--New on CT scan from 10/2020, opted to hold anticoagulation after weighing risks vs. Benefits. On today's scan, the thrombus is now chronic without propagation or extension, asymptomatic. LFTs are within normal range.   -No further follow up     #Pulmonary nodules--Stable over time  -Continue to follow on CT scans    FOLLOW UP  Return to clinic in 3 months, due 04/2021, orders placed.     Patient was seen and discussed with attending oncologist Dr. Malka So, MD  Fellow, Hematology/Oncology   [place] [facility]

## 2021-01-31 ENCOUNTER — Ambulatory Visit: Payer: Medicare Other | Attending: Hematology & Oncology | Admitting: Hematology & Oncology

## 2021-01-31 DIAGNOSIS — C182 Malignant neoplasm of ascending colon: Secondary | ICD-10-CM

## 2021-01-31 DIAGNOSIS — C189 Malignant neoplasm of colon, unspecified: Secondary | ICD-10-CM | POA: Insufficient documentation

## 2021-01-31 DIAGNOSIS — I81 Portal vein thrombosis: Secondary | ICD-10-CM | POA: Insufficient documentation

## 2021-01-31 DIAGNOSIS — R918 Other nonspecific abnormal finding of lung field: Secondary | ICD-10-CM | POA: Insufficient documentation

## 2021-02-08 NOTE — Addendum Note (Signed)
Addended by: Jennye Boroughs on: 02/08/2021 08:29 PM     Modules accepted: Level of Service

## 2021-04-13 ENCOUNTER — Encounter (HOSPITAL_BASED_OUTPATIENT_CLINIC_OR_DEPARTMENT_OTHER): Payer: Self-pay

## 2021-05-02 ENCOUNTER — Telehealth (HOSPITAL_BASED_OUTPATIENT_CLINIC_OR_DEPARTMENT_OTHER): Payer: Self-pay

## 2021-05-02 DIAGNOSIS — Z1211 Encounter for screening for malignant neoplasm of colon: Secondary | ICD-10-CM

## 2021-05-02 MED ORDER — SUPREP BOWEL PREP KIT 17.5-3.13-1.6 GM/177ML OR SOLN: ORAL | 0 refills | Status: DC

## 2021-05-03 ENCOUNTER — Telehealth (HOSPITAL_BASED_OUTPATIENT_CLINIC_OR_DEPARTMENT_OTHER): Payer: Self-pay

## 2021-05-03 NOTE — Procedure Nursing Note (Signed)
I spoke with the patient's son Olivia Mcconnell and sent an e-mail per his request containing colonoscopy prep instructions and to let them know that the prep is ordered at their preferred pharmacy. I confirmed with him that Olivia Mcconnell already has her Covid test scheduled for 05/15/2021.    Hello Olivia Mcconnell and Olivia Mcconnell,    This message explains how to prepare for your upcoming colonoscopy with anesthesia. It includes information about picking up your prescription, preparing your bowels, what to do if you take certain medications, and whether you need to be tested for COVID-19. As we discussed, I can see that a Covid test is already scheduled on 05/15/2021 but I wet ahead and left the information in this message for reference.    Picking up your prescription  You will need to drink a prescription prep solution to clean out your bowels before your procedure. We have sent a prescription for Suprep to the [facility] on [address]. Please pick it up within a week, as they often reshelve prescriptions that don't get picked up.     The pharmacy may substitute a different prep solution, if that happens, please let us know and we will send you the correct instructions.    Preparing your bowels  Attached are instructions for Suprep. Please read them at least 7 days before your appointment so we can answer any questions you may have.     The instructions in this message are different than the ones printed in your itinerary instructions. Do not use the diet instructions in your itinerary. Use the instructions attached to this message instead.    Cancellations: if you need to reschedule or cancel your test, call [phone].     TRANSPORTATION: Ensure transportation has been arranged for the day of your colonoscopy. You cannot drive on the day of your colonoscopy and you cannot take a taxi or bus unless you have a responsible adult to ride with you. Your test will be cancelled if you arrive and do not have a responsible adult to take you home.      Diabetes and blood thinning medications  If you take any diabetes or blood thinning medications, contact your ordering provider for special instructions. You may need to stop taking certain medications before your procedure and start taking them again after the procedure.        COVID-19 testing  If any of the following apply you will need to be tested for COVID 24 to 72 hours before your procedure, even if you are vaccinated:   . You have sleep apnea and use a CPAP   . Your procedure is scheduled with anesthesia   . Your procedure is an EGD (upper GI scope/upper endoscopy/ esophagogastroduodenoscopy)     If you would like to be tested at [facility], the [facility] Procedure Suite 2nd floor testing site is open from 3:00 PM - 5:00 PM, Monday through Friday. Weekend appointments are also available after 3:00 pm. Please call [phone] to schedule your testing appointment.     If you get tested in the community, please find a location on: [email or web address]  or [email or web address]        Testing requirements:     . The test needs to be a PCR test   . The test needs to be within 72 hours of your procedure   . The results will need to be available on your phone or in our chart before we can do your procedure  Thank you,  [facility]  Procedure Suite RN   [facility]  [address] [place], [address]    O: [phone] or [phone]  [phone]    [email or web address]

## 2021-05-06 ENCOUNTER — Telehealth (HOSPITAL_BASED_OUTPATIENT_CLINIC_OR_DEPARTMENT_OTHER): Payer: Self-pay

## 2021-05-09 ENCOUNTER — Ambulatory Visit: Admit: 2021-05-09 | Discharge: 2021-05-09 | Disposition: A | Discharge status: Home / Self Care | Payer: Medicare Other

## 2021-05-09 ENCOUNTER — Encounter (HOSPITAL_BASED_OUTPATIENT_CLINIC_OR_DEPARTMENT_OTHER): Payer: Self-pay

## 2021-05-09 HISTORY — DX: Sleep apnea, unspecified: G47.30

## 2021-05-09 NOTE — Anesthesia Preprocedure Evaluation (Addendum)
Patient: Olivia Mcconnell    Procedure Information     Date/Time: 05/17/21 1500    Scheduled providers: Franne Grip, MD; Caryl Bis, MD    Procedure: COLONOSCOPY    Location: [facility] Procedure Suite        HPI: Olivia Mcconnell is an 80yrold female. Presents today with her son for a pre-anesthesia visit for a colonoscopy procedure on 05/17/21  with Dr. BJustice Rocher     Note from 01/31/21 Dr. 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 KWindell Norfolk on 04/16/2020,s/p about 4 months ofadjuvant capecitabine 05/2020-09/2020.    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 Never smoker, occasional alcohol. Currently living with son JJenny Reichmannand his family    FAMILY HISTORY  Brother with kidney cancer   2 sisters with breast cancer    Relevant surgical history:       Medications:     Outpatient:   Current Outpatient Medications   Medication Instructions    acetaminophen (TYLENOL) 1,000 mg, Oral, Every 6 hours PRN    amLODIPine (NORVASC) 10 mg, Oral, Daily    iron 325 mg, Oral, Daily    levothyroxine (SYNTHROID; LEVOXYL) 137 mcg, Oral, Daily empty stomach    Na sulfate-K sulfate-Mg sulfate (Suprep Bowel Prep Kit) solution Follow split prep instructions provided by [facility]    urea 20 % cream Apply topically to hands and feet 3 times a day as needed for dry skin.                Review of patient's allergies indicates:  Allergies   Allergen Reactions    Ciprofloxacin Unknown    Sulfa  Antibiotics Unknown       Social History:          Medical History and Review of Systems      Source of information: In person visit.  Previous anesthesia: Yes (sedation and general)    History of anesthetic complications  (-) History of anesthetic complications.  (-) family history of anesthetic complications.      Functional Status   Able to walk 1 city block (200 yards) and able to lay flat and still for 30 minutes.   Functional status comments: Uses walker    Pulmonary Pt does not use CPAP  (+) sleep apnea    Neuro/Psych   Neg neuro/psych ROS    Cardiovascular   (+) hypertension, well controlled    HEENT   Neg HEENT ROS    Musculoskeletal   Neg musculoskeletal ROS    Skin   negative skin ROS    GI/Hepatic/Renal   neg GI/hepatic/renal ROS    Endo/Immunology   (+) hypothyroidism    Hematology   (+) anemia  Oncology   (+) cancer, chemotherapy, radiation treatment         Physical Exam  Airway  Mallampati:  III  Upper Lip Bite Test: I  TM distance:  >6 cm  Neck ROM:  Full  Mouth Opening:  Normal  Facial Hair:  None    Dental   caps/vaneers    Cardiovascular  Rhythm:  Regular  Rate:  Normal   peripheral edema    Pulmonary  normal    Breath sounds clear to auscultation             Labs: (last year)    BMP  CBC/Coags   Na 137 01/28/2021  Hb 15.0 01/28/2021   K 4.3 01/28/2021  HCT 43 01/28/2021   Cl 104 01/28/2021  WBC 7.69 01/28/2021   HCO3 27 01/28/2021  PLT 216 01/28/2021   BUN 14 01/28/2021  INR - -   Cr 0.67 01/28/2021  PT - -   Glu 105 01/28/2021  PTT - -       Misc   eGFR >60 01/28/2021  MCV 86 01/28/2021   A1C - -  BNP - -       LFTs   AST 22 01/28/2021  Albumin 4.3 01/28/2021   ALT 19 01/28/2021  Protein 7.0 01/28/2021   Alk Phos 78 01/28/2021  T Bili 0.6 01/28/2021         Relevant procedures / diagnostic studies:     ANESTHESIA PLAN   ASA Score:     ASA: 3  Planned Anesthetic Type:      MAC  MAC Justification:      age>70  Supervising Provider Comments:      80 yo with hx colorectal Ca, htn, distant hx breast Ca, presenting for  colonoscopy. Appropriately NPO, denies GERD, plan for colonoscopy under MAC      Risk Calculators / Scores:

## 2021-05-15 ENCOUNTER — Ambulatory Visit: Payer: Medicare Other | Attending: Hematology & Oncology

## 2021-05-15 DIAGNOSIS — C182 Malignant neoplasm of ascending colon: Secondary | ICD-10-CM

## 2021-05-15 LAB — COVID-19 CORONAVIRUS QUALITATIVE PCR: COVID-19 Coronavirus Qual PCR Result: NOT DETECTED

## 2021-05-15 NOTE — Progress Notes (Signed)
Patient was seen on 05/15/2021 at the [facility] LAB drive up site where a sample of Anterior Nares collection was taken. The specimen was sent to the [facility] lab for COVID-19 testing.  Patient will be informed of test results within 48 hours.  Patient received informational instructions on self-care.    The specimen was collected by: Bary Richard, RN

## 2021-05-16 ENCOUNTER — Encounter (HOSPITAL_BASED_OUTPATIENT_CLINIC_OR_DEPARTMENT_OTHER): Payer: Self-pay | Admitting: Gastroenterology

## 2021-05-16 ENCOUNTER — Telehealth (HOSPITAL_BASED_OUTPATIENT_CLINIC_OR_DEPARTMENT_OTHER): Payer: Self-pay

## 2021-05-16 DIAGNOSIS — Z87898 Personal history of other specified conditions: Secondary | ICD-10-CM | POA: Insufficient documentation

## 2021-05-16 DIAGNOSIS — F419 Anxiety disorder, unspecified: Secondary | ICD-10-CM | POA: Insufficient documentation

## 2021-05-16 NOTE — Procedure Nursing Note (Signed)
Spoke with patient. She said she did not receive the prep instructions. She ate a pizza 8/7 for lunch and a steak 8/7 for dinner. She has not eaten anything 8/8. This RN spoke with Dr. Deberah Castle regarding the pizza and steak 8/7 and he said it was ok to proceed with 8/9 colonoscopy. Dr. Deberah Castle is aware Dr. Justice Rocher is performing the colonoscopy.

## 2021-05-17 ENCOUNTER — Other Ambulatory Visit: Payer: Medicare Other | Admitting: Gastroenterology

## 2021-05-17 ENCOUNTER — Ambulatory Visit (HOSPITAL_BASED_OUTPATIENT_CLINIC_OR_DEPARTMENT_OTHER): Payer: Medicare Other

## 2021-05-17 ENCOUNTER — Ambulatory Visit (HOSPITAL_BASED_OUTPATIENT_CLINIC_OR_DEPARTMENT_OTHER): Payer: Medicare Other | Admitting: Anesthesiology

## 2021-05-17 ENCOUNTER — Ambulatory Visit
Admit: 2021-05-17 | Discharge: 2021-05-17 | Disposition: A | Discharge status: Home / Self Care | Payer: Medicare Other | Attending: Anesthesiology | Admitting: Anesthesiology

## 2021-05-17 ENCOUNTER — Encounter (HOSPITAL_BASED_OUTPATIENT_CLINIC_OR_DEPARTMENT_OTHER): Payer: Self-pay | Admitting: Gastroenterology

## 2021-05-17 DIAGNOSIS — D126 Benign neoplasm of colon, unspecified: Secondary | ICD-10-CM

## 2021-05-17 DIAGNOSIS — K5730 Diverticulosis of large intestine without perforation or abscess without bleeding: Secondary | ICD-10-CM

## 2021-05-17 DIAGNOSIS — D125 Benign neoplasm of sigmoid colon: Secondary | ICD-10-CM

## 2021-05-17 DIAGNOSIS — K648 Other hemorrhoids: Secondary | ICD-10-CM

## 2021-05-17 DIAGNOSIS — Z1211 Encounter for screening for malignant neoplasm of colon: Secondary | ICD-10-CM

## 2021-05-17 DIAGNOSIS — R69 Illness, unspecified: Secondary | ICD-10-CM

## 2021-05-17 DIAGNOSIS — Z980 Intestinal bypass and anastomosis status: Secondary | ICD-10-CM

## 2021-05-17 DIAGNOSIS — K644 Residual hemorrhoidal skin tags: Secondary | ICD-10-CM

## 2021-05-17 DIAGNOSIS — K5790 Diverticulosis of intestine, part unspecified, without perforation or abscess without bleeding: Secondary | ICD-10-CM

## 2021-05-17 DIAGNOSIS — C182 Malignant neoplasm of ascending colon: Secondary | ICD-10-CM | POA: Insufficient documentation

## 2021-05-17 DIAGNOSIS — K635 Polyp of colon: Secondary | ICD-10-CM

## 2021-05-17 DIAGNOSIS — Z85038 Personal history of other malignant neoplasm of large intestine: Secondary | ICD-10-CM

## 2021-05-17 DIAGNOSIS — D128 Benign neoplasm of rectum: Secondary | ICD-10-CM

## 2021-05-17 DIAGNOSIS — D124 Benign neoplasm of descending colon: Secondary | ICD-10-CM

## 2021-05-17 DIAGNOSIS — D123 Benign neoplasm of transverse colon: Secondary | ICD-10-CM

## 2021-05-17 DIAGNOSIS — K621 Rectal polyp: Secondary | ICD-10-CM

## 2021-05-17 MED ORDER — PLASMA-LYTE A IV SOLN
INTRAVENOUS | Status: DC | PRN
Start: 2021-05-17 — End: 2021-05-17

## 2021-05-17 MED ORDER — LIDOCAINE HCL (PF) 2 % IJ SOLN
INTRAMUSCULAR | Status: AC
Start: 2021-05-17 — End: ?
  Filled 2021-05-17: qty 5

## 2021-05-17 MED ORDER — PROPOFOL 500 MG/50ML IV EMUL INFUSION
INTRAVENOUS | Status: AC
Start: 2021-05-17 — End: ?
  Filled 2021-05-17: qty 100

## 2021-05-17 MED ORDER — ONDANSETRON HCL 4 MG/2ML IJ SOLN
INTRAMUSCULAR | Status: AC
Start: 2021-05-17 — End: ?
  Filled 2021-05-17: qty 2

## 2021-05-17 MED ORDER — PROPOFOL 10 MG/ML IV EMUL WRAPPER (OSM ONLY)
INTRAVENOUS | Status: DC | PRN
Start: 2021-05-17 — End: 2021-05-17
  Administered 2021-05-17: 100 ug/kg/min via INTRAVENOUS
  Administered 2021-05-17: 50 mg via INTRAVENOUS

## 2021-05-17 MED ORDER — LIDOCAINE HCL PF 2% IV/IJ SOSY/SOLN WRAPPER (ANESTHESIA OSM ONLY)
INTRAVENOUS | Status: DC | PRN
Start: 2021-05-17 — End: 2021-05-17
  Administered 2021-05-17: 60 mg via INTRAVENOUS

## 2021-05-17 MED ORDER — PROPOFOL 200 MG/20ML IV EMUL
INTRAVENOUS | Status: AC
Start: 2021-05-17 — End: ?
  Filled 2021-05-17: qty 20

## 2021-05-17 NOTE — H&P (Signed)
NON-OR PREPROCEDURE H&P      Planned procedure: COLONOSCOPY [GI6]  Diagnosis and indications for procedure: H/O colon cancer    NOTE: Required to choose either "Complete H&P" or "Non-OR Preprocedure H&P"  Non-OR Pre-Procedure H&P: I have recorded the Relevant History and Changes to History below AND I have reviewed any available pre-procedure screening/assessments on the EMR or written forms.    RELEVANT HISTORY OR CHANGE TO HISTORY:    Past Medical History:   Diagnosis Date    Thyroid cancer ([place]) 2011    Breast mass 1990    Removed    Anemia     Little    Colon cancer ([place])     Sleep apnea in adult     "I stopped using my CPAP"     Past Surgical History:   Procedure Laterality Date    COLONOSCOPY  04/01/2020    PR APPENDECTOMY  1990s    PR UNLISTED PROCEDURE BREAST Right 1990    Masectomy    THYROID SURGERY  2011    total thyroidectomy    TOTAL KNEE ARTHROPLASTY Left 09/2014     Social History     Tobacco Use    Smoking status: Never Smoker    Smokeless tobacco: Never Used   Substance and Sexual Activity    Alcohol use: Not Currently    Drug use: Never     Review of Systems (ROS)     Medications:  I have reviewed any medications recorded on any pre-procedure assessment or the EMR.  Current Outpatient Medications   Medication Instructions    acetaminophen (TYLENOL) 1,000 mg, Oral, Every 6 hours PRN    amLODIPine (NORVASC) 10 mg, Oral, Daily    iron 325 mg, Oral, Daily    levothyroxine (SYNTHROID; LEVOXYL) 137 mcg, Oral, Daily empty stomach    Na sulfate-K sulfate-Mg sulfate (Suprep Bowel Prep Kit) solution Follow split prep instructions provided by [facility]    urea 20 % cream Apply topically to hands and feet 3 times a day as needed for dry skin.       Allergies:  I have reviewed any allergies recorded on any pre-procedure assessment or the EMR.  Review of patient's allergies indicates:  Allergies   Allergen Reactions    Atorvastatin Palpitations    Ciprofloxacin Unknown    Niacin And Related  Flushing    Rosuvastatin Palpitations     Palp.    Sulfa Antibiotics Unknown       Reviewed Labs:  I have reviewed available labs.  Results pertinent to this procedure include:     Pre-procedure physical exam by MD/DDS/ARNP/PA-C (must be recorded prior to procedure):  I have reviewed vital signs recorded on the EMR just prior to the procedure or recorded them below:  BP 137/72 (BP Location: Left arm, Patient Position: Lying)    Pulse 74    Resp 17    Ht '5\' 2"'$  (1.575 m)    Wt 70.3 kg (155 lb)    SpO2 97%    BMI 28.35 kg/m     Physical Exam    General appearance: healthy    Cardiovascular: Normal    Respiratory: Normal    Abdominal: Normal    Neurological/Neuromuscular: Normal    Other notable PE findings relevant to planned procedure:     Assessment and Care Plan:  Proceed to colonoscopy

## 2021-05-20 LAB — PATHOLOGY, SURGICAL: Clinical Information: HIGH

## 2021-05-23 ENCOUNTER — Ambulatory Visit: Payer: Medicare Other | Attending: Hematology & Oncology

## 2021-05-23 ENCOUNTER — Ambulatory Visit (HOSPITAL_BASED_OUTPATIENT_CLINIC_OR_DEPARTMENT_OTHER): Payer: Medicare Other | Admitting: Hematology & Oncology

## 2021-05-23 ENCOUNTER — Encounter (HOSPITAL_BASED_OUTPATIENT_CLINIC_OR_DEPARTMENT_OTHER): Payer: Self-pay | Admitting: Gastroenterology

## 2021-05-23 DIAGNOSIS — I81 Portal vein thrombosis: Secondary | ICD-10-CM | POA: Insufficient documentation

## 2021-05-23 DIAGNOSIS — R5382 Chronic fatigue, unspecified: Secondary | ICD-10-CM | POA: Insufficient documentation

## 2021-05-23 DIAGNOSIS — C182 Malignant neoplasm of ascending colon: Secondary | ICD-10-CM

## 2021-05-23 DIAGNOSIS — C7A022 Malignant carcinoid tumor of the ascending colon: Secondary | ICD-10-CM | POA: Insufficient documentation

## 2021-05-23 LAB — CBC, DIFF
% Basophils: 0 %
% Eosinophils: 1 %
% Immature Granulocytes: 0 %
% Lymphocytes: 26 %
% Monocytes: 7 %
% Neutrophils: 66 %
Absolute Eosinophil Count: 0.08 10*3/uL (ref 0.00–0.50)
Absolute Lymphocyte Count: 1.76 10*3/uL (ref 1.00–4.80)
Basophils: 0.03 10*3/uL (ref 0.00–0.20)
Hematocrit: 43 % (ref 36.0–45.0)
Hemoglobin: 15.1 g/dL (ref 11.5–15.5)
Immature Granulocytes: 0.02 10*3/uL (ref 0.00–0.05)
MCH: 29.9 pg (ref 27.3–33.6)
MCHC: 35 g/dL (ref 32.2–36.5)
MCV: 86 fL (ref 81–98)
Monocytes: 0.47 10*3/uL (ref 0.00–0.80)
Neutrophils: 4.48 10*3/uL (ref 1.80–7.00)
Platelet Count: 206 10*3/uL (ref 150–400)
RBC: 5.05 10*6/uL — ABNORMAL HIGH (ref 3.80–5.00)
RDW-CV: 12.7 % (ref 11.6–14.4)
WBC: 6.84 10*3/uL (ref 4.3–10.0)

## 2021-05-23 LAB — COMPREHENSIVE METABOLIC PANEL
ALT (GPT): 22 U/L (ref 7–33)
AST (GOT): 17 U/L (ref 9–38)
Albumin: 4.1 g/dL (ref 3.5–5.2)
Alkaline Phosphatase (Total): 83 U/L (ref 49–199)
Anion Gap: 8 (ref 4–12)
Bilirubin (Total): 0.6 mg/dL (ref 0.2–1.3)
Calcium: 9.4 mg/dL (ref 8.9–10.2)
Carbon Dioxide, Total: 26 meq/L (ref 22–32)
Chloride: 104 meq/L (ref 98–108)
Creatinine: 0.88 mg/dL (ref 0.38–1.02)
Glucose: 102 mg/dL (ref 62–125)
Potassium: 4.1 meq/L (ref 3.6–5.2)
Protein (Total): 6.8 g/dL (ref 6.0–8.2)
Sodium: 138 meq/L (ref 135–145)
Urea Nitrogen: 13 mg/dL (ref 8–21)
eGFR by CKD-EPI 2021: >60 mL/min/{1.73_m2} (ref >59)

## 2021-05-23 LAB — CARCINOEMBRYONIC ANTIGEN: Carcinoembryonic Antigen: 3 ng/mL (ref 0.0–5.0)

## 2021-05-23 NOTE — Anesthesia Postprocedure Evaluation (Signed)
Patient: Olivia Mcconnell    Procedure Summary     Date: 05/17/21 Room / Location: [facility] Procedure Suite    Anesthesia Start: 4825 Anesthesia Stop: 1529    Procedure: COLONOSCOPY Diagnosis:       Cancer of ascending colon ([place])      Malignant neoplasm of colon, unspecified      (Last colonoscopy: July 2021)    Scheduled Providers: Franne Grip, MD; Caryl Bis, MD Responsible Provider: Caryl Bis, MD    Anesthesia Type: MAC ASA Status: 3        Final Anesthesia Type: MAC    Vitals Value Taken Time   BP 125/70 05/17/21 1600   Temp 36.6 C 05/17/21 1540   Pulse 70 05/17/21 1600   SpO2 96 % 05/17/21 1600       Place of evaluation: PACU    Patient participation: patient participated    Level of consciousness: fully conscious    Patient pain control satisfaction: patient is satisfied with level of pain control    Airway patency: patent    Cardiovascular status during assessment: stable    Respiratory status during assessment: breathing comfortably    Anesthetic complications: no    Intravascular volume status assessment: euvolemic    Nausea / vomiting: patient is not experiencing nausea      Planned post-operative disposition at time of assessment: ward care and hospital discharge    Additional comments: Discharged with son, no anesthesia related complaints

## 2021-05-23 NOTE — Progress Notes (Signed)
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 Windell Norfolk) on 04/16/2020,s/p about 4 months ofadjuvant capecitabine 05/2020-09/2020. Presents today for routine follow up and colonoscopy review.     GENETICS/MOLECULAR:  Microsatellite stable    CURRENT TREATMENT:  Surveillance    TREATMENT HISTORY:  Oncology History Overview   04/01/2020: Colonoscopy done for iron deficiency anemia, shows obstructing cecal mass, path consistent with invasive adenocarcinoma   04/06/2020: CT chest/abdomen/pelvis shows primary cecal neoplasm measuring 7.3 x 6.7 x 5.7cm invading ileocecal valve and distal ileum, with transmural penetration of the tumor and local invasion into pericolonic fat. Local lymphadenopathy in the cecal mesentery and R ileocolonic mesentery, largest measuring 2.1 x 1.4 x 1.7cm. No distant metastatic disease.   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 PT3 pN2a   06/01/2020-09/27/2020: Adjuvant capecitabine  10/14/2019: End of treatment CT scan with no evidence of disease, with new portal venous thrombi.   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             INTERVAL HISTORY  [name] presents today with her son for routine follow up and colonoscopy review. She has been doing well  in the last several months, with no major new medical changes. Her son notes that her fatigue has improved over the last 3 months, though [name] is still far from baseline in regard to energy and physical activity. She is working with physical therapy once weekly. She has not had any new or concerning symptoms.      REVIEW OF SYSTEMS  A complete review of systems was completed and negative unless documented above in HPI    HOME MEDICATIONS    Current Outpatient Medications:     acetaminophen 500 MG tablet, Take 2 tablets (1,000 mg) by mouth every 6 hours as needed for pain or severe pain. (Patient not taking: Reported on 01/31/2021), Disp: 40 tablet, Rfl: 0    amLODIPine 10 MG tablet, Take 1 tablet (10 mg) by mouth daily., Disp: 7 tablet, Rfl: 0    Ferrous Sulfate (iron) 325 (65 Fe) MG tablet, Take 1 tablet (325 mg) by mouth daily. (Patient taking differently: Take 325 mg by mouth daily. 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 Never smoker, occasional alcohol. Currently living with son [name] and his family    FAMILY HISTORY:  Brother with kidney cancer   2 sisters with breast cancer    PHYSICAL EXAMINATION  VITAL SIGNS:   Vitals:    05/23/21 1602   BP: (!) 147/84   Pulse:  70   Resp: 16   Temp: 36.9 C   SpO2: 97%     GENERAL: Comfortable appearing, very pleasant woman in no acute distress  CHEST: Normal work of breathing  NEURO: Appropriate gait with walker. Moving all extremities spontaneously   MSK: Normal bulk and tone in all extremities  SKIN: No apparent rashes or lesions    ECOG: 2    LABORATORY STUDIES:  CBC:  Lab Results   Component Value Date/Time    WBC 6.84 05/23/2021 03:00 PM    HEMOGLOBIN 15.1 05/23/2021 03:00 PM    HEMATOCRIT 43 05/23/2021 03:00 PM    PLATELET 206 05/23/2021 03:00 PM    ANEUT 4.48 05/23/2021 62:37 PM     Metabolic panel:  Lab Results   Component Value Date/Time    POTASSIUM 4.1 05/23/2021 03:00 PM    CL 104 05/23/2021 03:00 PM    CO2 26 05/23/2021 03:00 PM    BUN 13 05/23/2021 03:00 PM    CREATININE 0.88 05/23/2021 03:00 PM    AST 17 05/23/2021 03:00 PM    ALT 22 05/23/2021 03:00 PM    ALK 83 05/23/2021 03:00 PM    BILIRUBN 0.6 05/23/2021 03:00 PM    ALBUMIN 4.1 05/23/2021 03:00 PM    PROTEIN 6.8 05/23/2021 03:00 PM     Tumor Markers:  No results found for: CA199  Lab Results   Component Value Date/Time    CEA 3.2 01/28/2021 01:53 PM    CEA 3.4 09/20/2020 12:38 PM    CEA 2.5 04/09/2020 04:11 PM     PATHOLOGY:    Colonoscopy polyps 05/17/2021:  A) Colon polyps x4, biopsy:                - Tubular adenoma in 1 of 4 mucosal fragments.                - Hyperplastic polyp in 2 fragments.  B) Sigmoid colon polyp, biopsy:                - Hyperplastic polyp.    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 Windell Norfolk) on 04/16/2020,s/p about 4 months ofadjuvant capecitabine 05/2020-09/2020. Presents today for routine follow up and colonoscopy review.     #Stage IIIa colon adenocarcinoma--S/p surgical resection as above, s/p about 31month of adjuvant capecitabine treatment,  discontinued due to fatigue and poor quality of life despite dose reductions. No new concerning symptoms, with CEA stable at 3.0 today. Reviewed 1 year colonoscopy results today which appear very reassuring, with 1/5 polyps a very small tubular adenoma, for which we would recommend a 3 year colonoscopy. Will continue to discuss colonoscopy as surveillance continues.   -Due for colonoscopy in 3 years for now, will continue to discuss.   -Surveillance:              -MD visit + labs including CEA  q3 months x 2 years (end 10/2022), next due 08/2021, orders placed               -CT chest/abdomen/pelvis annually x2 years (end 10/2022), space out after 2 years. Next due 01/2022 orders placed.   -Also discussed incorporation of baby aspirin to prevent new polyp formation, Parminder will consider    #Portal venous thrombosis--On CT scan from 10/2020, opted to hold anticoagulation after weighing risks vs. Benefits. Thrombus now chronic on subsequent scans.  -No further follow up     #Pulmonary nodules--Stable over time  -Continue to follow on CT scans    FOLLOW UP:  Return to clinic in 3 months with CEA and labs.     Patient was seen and discussed with attending oncologist Dr. Malka So, MD  Fellow, Hematology/Oncology   [facility] [place]

## 2021-06-29 ENCOUNTER — Emergency Department (HOSPITAL_COMMUNITY): Payer: Medicare Other

## 2021-06-29 ENCOUNTER — Emergency Department (EMERGENCY_DEPARTMENT_HOSPITAL): Payer: Medicare Other

## 2021-06-29 ENCOUNTER — Telehealth (HOSPITAL_BASED_OUTPATIENT_CLINIC_OR_DEPARTMENT_OTHER): Payer: Self-pay | Admitting: Nursing

## 2021-06-29 ENCOUNTER — Emergency Department
Admission: EM | Admit: 2021-06-29 | Discharge: 2021-06-29 | Disposition: A | Discharge status: Home / Self Care | Payer: Medicare Other | Attending: Emergency Medicine | Admitting: Emergency Medicine

## 2021-06-29 ENCOUNTER — Other Ambulatory Visit: Payer: Self-pay

## 2021-06-29 DIAGNOSIS — Z85850 Personal history of malignant neoplasm of thyroid: Secondary | ICD-10-CM | POA: Insufficient documentation

## 2021-06-29 DIAGNOSIS — R918 Other nonspecific abnormal finding of lung field: Secondary | ICD-10-CM

## 2021-06-29 DIAGNOSIS — K625 Hemorrhage of anus and rectum: Secondary | ICD-10-CM | POA: Insufficient documentation

## 2021-06-29 DIAGNOSIS — Z853 Personal history of malignant neoplasm of breast: Secondary | ICD-10-CM | POA: Insufficient documentation

## 2021-06-29 DIAGNOSIS — Z85038 Personal history of other malignant neoplasm of large intestine: Secondary | ICD-10-CM | POA: Insufficient documentation

## 2021-06-29 DIAGNOSIS — N3000 Acute cystitis without hematuria: Secondary | ICD-10-CM | POA: Insufficient documentation

## 2021-06-29 DIAGNOSIS — Z20822 Contact with and (suspected) exposure to covid-19: Secondary | ICD-10-CM | POA: Insufficient documentation

## 2021-06-29 DIAGNOSIS — K644 Residual hemorrhoidal skin tags: Secondary | ICD-10-CM | POA: Insufficient documentation

## 2021-06-29 LAB — URINALYSIS WITH REFLEX CULTURE
Bilirubin (Qual), URN: NEGATIVE
Epith Cells_Renal/Trans,URN: NEGATIVE /HPF
Glucose Qual, URN: NEGATIVE
Ketones, URN: NEGATIVE
Leukocyte Esterase, URN: POSITIVE — AB
Nitrite, URN: POSITIVE — AB
Occult Blood, URN: NEGATIVE
Protein (Alb Semiquant), URN: NEGATIVE
RBC, URN: NEGATIVE /HPF
Specific Gravity, URN: 1.009 g/mL (ref 1.006–1.027)
pH, URN: 6.5 (ref 5.0–8.0)

## 2021-06-29 LAB — CBC, DIFF
% Basophils: 1 %
% Eosinophils: 2 %
% Immature Granulocytes: 0 %
% Lymphocytes: 29 %
% Monocytes: 7 %
% Neutrophils: 61 %
% Nucleated RBC: 0 %
Absolute Eosinophil Count: 0.09 10*3/uL (ref 0.00–0.50)
Absolute Lymphocyte Count: 1.6 10*3/uL (ref 1.00–4.80)
Basophils: 0.03 10*3/uL (ref 0.00–0.20)
Hematocrit: 44 % (ref 36.0–45.0)
Hemoglobin: 14.7 g/dL (ref 11.5–15.5)
Immature Granulocytes: 0.01 10*3/uL (ref 0.00–0.05)
MCH: 28.7 pg (ref 27.3–33.6)
MCHC: 33.3 g/dL (ref 32.2–36.5)
MCV: 86 fL (ref 81–98)
Monocytes: 0.37 10*3/uL (ref 0.00–0.80)
Neutrophils: 3.46 10*3/uL (ref 1.80–7.00)
Nucleated RBC: 0 10*3/uL
Platelet Count: 196 10*3/uL (ref 150–400)
RBC: 5.12 10*6/uL — ABNORMAL HIGH (ref 3.80–5.00)
RDW-CV: 12.5 % (ref 11.0–14.5)
WBC: 5.56 10*3/uL (ref 4.3–10.0)

## 2021-06-29 LAB — SARS-COV-2 (COVID-19) QUALITATIVE RAPID PCR: COVID-19 Coronavirus Qual PCR Result: NOT DETECTED

## 2021-06-29 LAB — 1ST EXTRA GOLD TOP

## 2021-06-29 LAB — PROTHROMBIN TIME
Prothrombin INR: 0.9 (ref 0.8–1.3)
Prothrombin Time Patient: 12.4 s (ref 10.7–15.6)

## 2021-06-29 LAB — COMPREHENSIVE METABOLIC PANEL
ALT (GPT): 20 U/L (ref 7–33)
AST (GOT): 14 U/L (ref 9–38)
Albumin: 4 g/dL (ref 3.5–5.2)
Alkaline Phosphatase (Total): 81 U/L (ref 49–199)
Anion Gap: 5 (ref 4–12)
Bilirubin (Total): 0.6 mg/dL (ref 0.2–1.3)
Calcium: 9 mg/dL (ref 8.9–10.2)
Carbon Dioxide, Total: 28 meq/L (ref 22–32)
Chloride: 105 meq/L (ref 98–108)
Creatinine: 0.68 mg/dL (ref 0.38–1.02)
Glucose: 109 mg/dL (ref 62–125)
Potassium: 4 meq/L (ref 3.6–5.2)
Protein (Total): 6.6 g/dL (ref 6.0–8.2)
Sodium: 138 meq/L (ref 135–145)
Urea Nitrogen: 12 mg/dL (ref 8–21)
eGFR by CKD-EPI 2021: >60 mL/min/{1.73_m2} (ref >59)

## 2021-06-29 LAB — MAGNESIUM: Magnesium: 1.9 mg/dL (ref 1.8–2.4)

## 2021-06-29 LAB — LIPASE: Lipase: 63 U/L (ref <70)

## 2021-06-29 LAB — REFLEX CULTURE FOR UA

## 2021-06-29 MED ORDER — IOHEXOL 350 MG/ML IV SOLN
80.0000 mL | Freq: Once | INTRAVENOUS | Status: AC | PRN
Start: 2021-06-29 — End: 2021-06-29
  Administered 2021-06-29: 80 mL via INTRAVENOUS

## 2021-06-29 MED ORDER — CEFTRIAXONE SODIUM IN DEXTROSE 40 MG/ML IV SOLN
2.0000 g | Freq: Once | INTRAVENOUS | Status: AC
Start: 2021-06-29 — End: 2021-06-29
  Administered 2021-06-29: 2 g via INTRAVENOUS
  Filled 2021-06-29: qty 50

## 2021-06-29 MED ORDER — NITROFURANTOIN MONOHYD MACRO 100 MG OR CAPS
100.0000 mg | ORAL_CAPSULE | Freq: Two times a day (BID) | ORAL | 0 refills | Status: AC
Start: 2021-06-29 — End: 2021-07-06

## 2021-06-29 NOTE — Discharge Instructions (Addendum)
You were seen in the emergency department today for rectal bleeding.  You have external hemorrhoids, and likely have internal hemorrhoids.  Your blood counts were normal.  We also did a CT scan of your abdomen and pelvis, which did not show any evidence of active bleeding or new obvious cancer.  The rest of your labs are reassuring.  Your urine sample showed evidence of a urinary tract infection, which we treated with antibiotics.  I sent a prescription for antibiotics to your pharmacy.  You should take these until they are gone.  Based on your work-up today, we think that your bleeding was likely caused by either internal or external hemorrhoids.  He should return to the ED immediately if you experience worsening or severe bleeding, or changing her depends greater than once per hour, or have clots the size of intake or a larger.  You also need to follow-up with your physician at the [facility] to obtain a repeat colonoscopy, as we are not able to definitively rule out a polyp or recurrence of your cancer without a colonoscopy.  I also recommend that you follow-up with your primary care physician to monitor your blood counts and ensure that your rectal bleeding has resolved.  They may consider referring you to general surgery for banding of internal hemorrhoids if this continues to occur for you.

## 2021-06-29 NOTE — ED Triage Notes (Signed)
Sent by [facility] for eval of rectal bleeding beginning last night (bright red)   Hx of breast, thyroid and colon cancer

## 2021-06-29 NOTE — Telephone Encounter (Addendum)
[name] called to inform team mom was currently at [facility]. Dr. Patrice Paradise notified via inbasket message. [name] encourage to call team/CNC line as needed.     ----- Message from Roselyn Meier, RN sent at 06/29/2021 10:35 AM PDT -----  Regarding: patient in [place],   Ms. Bockrath's son called [place] to update team that she is currently at the [facility] ER being worked up for abdominal pain and rectal bleeding.     I will let you know if I hear anymore news or if she gets admitted.     Thanks,   Raquel Sarna, Therapist, sports (covering [employer])

## 2021-06-29 NOTE — ED Notes (Signed)
Pt had voided in bed. Pt cleaned, sheets changed and new pad placed on bed.      Thomasene Ripple, RN  06/29/21 1256

## 2021-06-29 NOTE — ED Provider Notes (Signed)
EMERGENCY DEPARTMENT ENCOUNTER     PATIENT IDENTIFIERS  PATIENT NAME: Olivia Mcconnell  MRN: [id number]    CHIEF COMPLAINT    No chief complaint on file.      HISTORY OF PRESENT ILLNESS  Olivia Mcconnell is a 80 year old female with PMHx of remote breast cancer, thyroid cancer s/p thyroidectomy on levothyroxine, and colon cancer s/p excision via colonoscopy presenting with rectal bleeding.  The patient went to dinner yesterday evening, and reports feeling "off".  After dinner, she returned back to her apartment in her assisted living and noticed bright red blood per rectum.  She reports "cupfuls" of blood that filled both her depends.  Today, she endorses abdominal fullness, but denies pain.  Her rectal bleeding has stopped, and her depends that she arrived to the ED with did not have any blood in it per nursing.  The patient denies any fevers, chills, headache, syncope, dizziness or lightheadedness, fatigue, coughing, shortness of breath, chest pain, vomiting, abdominal pain, rectal pain, diarrhea, melena, rash, or lower extremity swelling.    PAST MEDICAL HISTORY   Past Medical History:   Diagnosis Date    Thyroid cancer ([place]) 2011    Breast mass 1990    Removed    Anemia     Little    Colon cancer ([place])     Sleep apnea in adult     "I stopped using my CPAP"         CURRENT OUTPATIENT MEDICATIONS    Current Outpatient Medications   Medication Instructions    acetaminophen (TYLENOL) 1,000 mg, Oral, Every 6 hours PRN    amLODIPine (NORVASC) 10 mg, Oral, Daily    iron 325 mg, Oral, Daily    levothyroxine (SYNTHROID; LEVOXYL) 137 mcg, Oral, Daily empty stomach    Na sulfate-K sulfate-Mg sulfate (Suprep Bowel Prep Kit) solution Follow split prep instructions provided by [facility]    urea 20 % cream Apply topically to hands and feet 3 times a day as needed for dry skin.       ALLERGIES  Review of patient's allergies indicates:  Allergies    Allergen Reactions    Atorvastatin Palpitations    Ciprofloxacin Unknown    Niacin And Related Flushing    Rosuvastatin Palpitations     Palp.    Sulfa Antibiotics Unknown       SOCIAL HISTORY  Social History     Tobacco Use    Smoking status: Never Smoker    Smokeless tobacco: Never Used   Substance Use Topics    Alcohol use: Not Currently    Drug use: Never       FAMILY HISTORY      REVIEW OF SYSTEMS  10/14 systems reviewed and positives stated in HPI, otherwise negative (included systems: constitutional, HENT, eyes, CV, respiratory, GI, GU, MSK, neuro, skin)    PHYSICAL EXAM   Vital signs on arrival: Temp: 36 C Pulse: 65 Resp: 16 SpO2: 97 % BP: 132/75    General: Non-toxic appearing, no acute distress  HENT: Normocephalic, atraumatic  Eyes: No conjunctival injection, no scleral icterus  CV: Regular rate and rhythm, no murmurs/rubs/gallops, 2+ radial pulses bilaterally  Respiratory: Unlabored respirations on ambient air, equal bilateral breath sounds, no crackles/rhonchi/wheezes/stridor  GI: Abdomen soft, tender to palpation in the bilateral lower quadrants and iliac bones, suprapubic tenderness, non-distended, no rebound tenderness, no guarding  DRE: External hemorrhoids and prolapsed internal hemorrhoids appreciated on external exam, no bleeding appreciated, non-tender, small palpable internal  hemorrhoids on exam, no tenderness with DRE. No frank blood or melena  MSK: No gross bony deformities. No edema.  Neuro: Alert and oriented, CN II-XII grossly intact, moving all extremities equally  Skin: Exposed skin without erythema, ecchymoses, lacerations, or abrasions  Psych: Normal mood and thought content      LABS  Labs Reviewed   CBC, DIFF - Abnormal       Result Value    WBC 5.56      RBC 5.12 (*)     Hemoglobin 14.7      Hematocrit 44      MCV 86      MCH 28.7      MCHC 33.3      Platelet Count 196      RDW-CV 12.5      % Neutrophils 61      % Lymphocytes 29      % Monocytes 7      % Eosinophils 2       % Basophils 1      % Immature Granulocytes 0      Neutrophils 3.46      Absolute Lymphocyte Count 1.60      Monocytes 0.37      Absolute Eosinophil Count 0.09      Basophils 0.03      Immature Granulocytes 0.01      Nucleated RBC 0.00      % Nucleated RBC 0     URINALYSIS WITH REFLEX CULTURE - Abnormal    Color, URN Pale      Clarity, URN Clear      Specific Gravity, URN 1.009      pH, URN 6.5      Protein ([place]), URN Negative      Glucose Qual, URN Negative      Ketones, URN Negative      Bilirubin (Qual), URN Negative      Occult Blood, URN Negative      Nitrite, URN Positive (*)     Leukocyte Esterase, URN Positive (*)     Urobilinogen, URN 0.1-1.9      Comments for Macroscopic, URN None      Collection Method Information not provided      WBC, URN 6-10(1+) (*)     RBC, URN 0-2(NEG)      Bacteria, URN Present (*)     Epith Cells_Squamous, URN >5(PRESENT) (*)     Epith Cells_Renal/Trans,URN <3(NEG)      Comments For Microscopic, URN None      Mucus, URN Present (*)     1st Extra Urine [facility] Additional collection tube     PROTHROMBIN TIME    Prothrombin Time Patient 12.4      Prothrombin INR 0.9     COMPREHENSIVE METABOLIC PANEL    Sodium 427      Potassium 4.0      Chloride 105      Carbon Dioxide, Total 28      Anion Gap 5      Glucose 109      Urea Nitrogen 12      Creatinine 0.68      Protein (Total) 6.6      Albumin 4.0      Bilirubin (Total) 0.6      Calcium 9.0      AST (GOT) 14      Alkaline Phosphatase (Total) 81      ALT (GPT) 20      eGFR by  CKD-EPI 2021 >60     MAGNESIUM    Magnesium 1.9     LIPASE    Lipase 63     SARS-COV-2 (COVID-19) QUALITATIVE RAPID PCR    COVID-19 Coronavirus Qual PCR Specimen Type Nasal swab      COVID-19 Coronavirus Qual PCR Result None detected      COVID-19 Coronavirus Qual PCR Interpretation        Value: This is a negative result. Laboratory testing alone cannot rule out infection, particularly in the presence of clinical risk factors such as symptoms or  exposure history.    COVID-19 Qualitative PCR Indication Admission Surveillance     REFLEX CULTURE FOR UA       IMAGING  CTA Abdomen and Pelvis Angio   Final Result   No discrete gastrointestinal bleed is identified.      Pulmonary nodules in the lingula are unchanged compared to the 01/28/2021 exam.      Cholelithiasis.      No evidence of local recurrence.                MEDICAL DECISION MAKING    Olivia Mcconnell is a 80 year old female presenting with bright red blood per rectum and abdominal distention but no pain or any other concerning symptoms. On arrival patient is afebrile hemodynamically stable. Physical exam remarkable for bilateral lower quadrant tenderness, tenderness with palpation of iliac crests, and suprapubic abdominal tenderness. Differential diagnosis includes: lower GI bleed, bleeding secondary to internal or external hemorrhoids, anal fissure, recurrence of colon cancer, acute blood loss anemia, mesenteric ischemia, among others. Given differential, plan: CBC, CMP, lipase, UA, coags, and CTA of the patient's abdomen and pelvis.  The patient was offered pain medication, but reported no discomfort and does not need pain medications at this time.    Labs obtained and overall very reassuring with no evidence of anemia, normal organ function, normal electrolytes.  COVID swab negative.  Lipase negative.  Coags within normal limits.  CTA of the abdomen obtained which did not show any evidence of active GI bleeding, no new mass or clear cancer recurrence, and no evidence of abdominal perforation or obstruction or mesenteric ischemia.  On digital rectal exam, the patient has external hemorrhoids, prolapsed internal hemorrhoids, and palpable internal hemorrhoids.  No tenderness with DRE, making anal fissure less likely.  I suspect the patient's bleeding was secondary to her hemorrhoids.  Mass not entirely exclused.  I discussed this with the patient, and recommended that the patient follow-up with  her [facility] provider to ensure that she undergoes repeat colonoscopy as soon as able to rule out mass.  We discussed that we cannot definitively rule out cancer recurrence or colonic polyps without a colonoscopy, and the patient expressed understanding.    The patient's UA was positive for nitrates, leuk esterase, pyuria, and bacteria.  Given the patient's suprapubic abdominal pain, this is concerning for urinary tract infection.  The patient was given a dose of ceftriaxone while in the ED, and a prescription for 7 days of Macrobid was sent to the patient's pharmacy.  I discussed this finding with the patient, and recommend she follow-up with her primary care provider to ensure that her symptoms and suprapubic abdominal pain resolved.  Strict return precautions were discussed with the patient prior to discharge.  The patient was discharged to home/self-care in stable condition.    ED course:  ED Course as of 06/29/21 2019   Wed Jun 29, 2021   1146  Urinalysis with Reflex Culture(!)  Nitrite and leuk esterase positive w/ pyuria. Pt has suprapubic abdominal pain. Will treat for UTI   [DS]   1147 Comprehensive Metabolic Panel  Reassuring [DS]      ED Course User Index  [DS] Rosalita Levan, MD       Medications given in the ED:  Medications   cefTRIAXone (Rocephin) 2 g in Iso-Osmotic (dextrose 2.4%) 50 mL IVPB (premix) (0 g Intravenous Stopped 06/29/21 1256)   iohexol (Omnipaque) 350 MG/ML contrast injection 80 mL (80 mL Intravenous Contrast Given 06/29/21 1225)       Patient was given scripts for the following medications:  Discharge Medication List as of 06/29/2021  3:22 PM      START taking these medications    Details   nitrofurantoin monohydrate macro 100 MG capsule Take 1 capsule (100 mg) by mouth 2 times a day for 7 days.Disp-14 capsule, R-0, Normal             FINAL IMPRESSION   (K62.5) BRBPR (bright red blood per rectum)  (primary encounter diagnosis)    (N30.00) Acute cystitis without hematuria    (K64.4) External  hemorrhoids       DISPOSITION  Discharge    Rosalita Levan, MD  Department of Emergency Medicine  [place] of Durene Fruits, MD  Resident  06/29/21 2019      I evaluated patient, reviewed any lab and imaging results, and agree with the resident's note, which I have edited when appropriate.    Dr. Lupita Raider. Chicoine Laurine Blazer, MD  Attending, [facility] Department of Emergency Medicine       Chicoine Ervin Knack, MD  06/30/21 [phone]

## 2021-06-29 NOTE — ED Notes (Signed)
Pt reports having abdominal cramping, bloating and tenderness with bright red frank blood since last night. Felt a bit lightheaded this morning. Denies Syncope, SOB, CP, headache, N/V/D. Hx of colon cancer. Last scope was in August.      Thomasene Ripple, RN  06/29/21 1055

## 2021-06-30 LAB — URINE C/S: Culture: >100000 — AB

## 2021-08-14 ENCOUNTER — Encounter (HOSPITAL_BASED_OUTPATIENT_CLINIC_OR_DEPARTMENT_OTHER): Payer: Self-pay | Admitting: Hematology & Oncology

## 2021-08-14 ENCOUNTER — Encounter (HOSPITAL_BASED_OUTPATIENT_CLINIC_OR_DEPARTMENT_OTHER): Payer: Self-pay | Admitting: Surgery

## 2021-08-28 ENCOUNTER — Other Ambulatory Visit: Payer: Self-pay

## 2021-08-28 ENCOUNTER — Emergency Department (EMERGENCY_DEPARTMENT_HOSPITAL): Payer: Medicare Other

## 2021-08-28 ENCOUNTER — Emergency Department
Admission: EM | Admit: 2021-08-28 | Discharge: 2021-08-29 | Disposition: A | Discharge status: Home / Self Care | Payer: Medicare Other | Attending: Emergency Medicine | Admitting: Emergency Medicine

## 2021-08-28 DIAGNOSIS — S299XXA Unspecified injury of thorax, initial encounter: Secondary | ICD-10-CM

## 2021-08-28 DIAGNOSIS — I10 Essential (primary) hypertension: Secondary | ICD-10-CM | POA: Insufficient documentation

## 2021-08-28 DIAGNOSIS — R9431 Abnormal electrocardiogram [ECG] [EKG]: Secondary | ICD-10-CM

## 2021-08-28 DIAGNOSIS — Z20822 Contact with and (suspected) exposure to covid-19: Secondary | ICD-10-CM | POA: Insufficient documentation

## 2021-08-28 DIAGNOSIS — W19XXXA Unspecified fall, initial encounter: Secondary | ICD-10-CM

## 2021-08-28 DIAGNOSIS — R509 Fever, unspecified: Secondary | ICD-10-CM | POA: Insufficient documentation

## 2021-08-28 DIAGNOSIS — S0990XA Unspecified injury of head, initial encounter: Secondary | ICD-10-CM

## 2021-08-28 LAB — COMPREHENSIVE METABOLIC PANEL
ALT (GPT): 20 U/L (ref 7–33)
AST (GOT): 19 U/L (ref 9–38)
Albumin: 4.1 g/dL (ref 3.5–5.2)
Alkaline Phosphatase (Total): 82 U/L (ref 49–199)
Anion Gap: 8 (ref 4–12)
Bilirubin (Total): 1.5 mg/dL — ABNORMAL HIGH (ref 0.2–1.3)
Calcium: 8.6 mg/dL — ABNORMAL LOW (ref 8.9–10.2)
Carbon Dioxide, Total: 27 meq/L (ref 22–32)
Chloride: 101 meq/L (ref 98–108)
Creatinine: 0.75 mg/dL (ref 0.38–1.02)
Glucose: 121 mg/dL (ref 62–125)
Potassium: 3.2 meq/L — ABNORMAL LOW (ref 3.6–5.2)
Protein (Total): 7 g/dL (ref 6.0–8.2)
Sodium: 136 meq/L (ref 135–145)
Urea Nitrogen: 13 mg/dL (ref 8–21)
eGFR by CKD-EPI 2021: >60 mL/min/{1.73_m2} (ref >59)

## 2021-08-28 LAB — LAB ADD ON ORDER

## 2021-08-28 LAB — 1ST EXTRA GRAY TOP

## 2021-08-28 LAB — CBC, DIFF
% Basophils: 0 %
% Eosinophils: 0 %
% Immature Granulocytes: 0 %
% Lymphocytes: 13 %
% Monocytes: 9 %
% Neutrophils: 78 %
% Nucleated RBC: 0 %
Absolute Eosinophil Count: 0.03 10*3/uL (ref 0.00–0.50)
Absolute Lymphocyte Count: 1.22 10*3/uL (ref 1.00–4.80)
Basophils: 0.03 10*3/uL (ref 0.00–0.20)
Hematocrit: 45 % (ref 36.0–45.0)
Hemoglobin: 15.2 g/dL (ref 11.5–15.5)
Immature Granulocytes: 0.04 10*3/uL (ref 0.00–0.05)
MCH: 29.2 pg (ref 27.3–33.6)
MCHC: 34.2 g/dL (ref 32.2–36.5)
MCV: 85 fL (ref 81–98)
Monocytes: 0.85 10*3/uL — ABNORMAL HIGH (ref 0.00–0.80)
Neutrophils: 7.37 10*3/uL — ABNORMAL HIGH (ref 1.80–7.00)
Nucleated RBC: 0 10*3/uL
Platelet Count: 171 10*3/uL (ref 150–400)
RBC: 5.21 10*6/uL — ABNORMAL HIGH (ref 3.80–5.00)
RDW-CV: 13.2 % (ref 11.0–14.5)
WBC: 9.54 10*3/uL (ref 4.3–10.0)

## 2021-08-28 LAB — 1ST EXTRA PEARL TOP

## 2021-08-28 LAB — 1ST EXTRA LAVENDER TOP

## 2021-08-28 LAB — 1ST EXTRA LIME GREEN TOP

## 2021-08-28 LAB — RAPID FLU A & B,  RSV, SARS-COV-2 PCR
COVID-19 Coronavirus Qual PCR Result: NOT DETECTED
Rapid Influenza A PCR Result: NOT DETECTED
Rapid Influenza B PCR Result: NOT DETECTED
Rapid RSV PCR Result: NOT DETECTED

## 2021-08-28 LAB — TROPONIN_I
Troponin_I Interpretation: NORMAL
Troponin_I: <0.03 ng/mL (ref <0.04)

## 2021-08-28 LAB — B_TYPE NATRIURETIC PEPTIDE
B_Type Natriuretic Peptide: 152 pg/mL — ABNORMAL HIGH (ref <101)
B_Type Natriuretic Peptide: 143 pg/mL — ABNORMAL HIGH (ref <101)

## 2021-08-28 LAB — CK, CREATINE KINASE, TOTAL ACTIVITY: Creatine Kinase Total Activity: 177 U/L (ref 43–274)

## 2021-08-28 LAB — GLUCOSE POC, ~~LOC~~: Glucose (POC): 131 mg/dL — ABNORMAL HIGH (ref 62–125)

## 2021-08-28 LAB — 2ND EXTRA LIME GREEN TOP

## 2021-08-28 LAB — L LACTATE, VENOUS PLASMA (TO U/H LAB > 30 MIN): L Lactate (Indirect), VEN: 1.4 mmol/L (ref 0.5–2.2)

## 2021-08-28 LAB — 1ST EXTRA BLUE TOP

## 2021-08-28 MED ORDER — POTASSIUM CHLORIDE CRYS ER 20 MEQ OR TBCR
40.0000 meq | EXTENDED_RELEASE_TABLET | Freq: Once | ORAL | Status: AC
Start: 2021-08-28 — End: 2021-08-28
  Administered 2021-08-28: 40 meq via ORAL
  Filled 2021-08-28: qty 2

## 2021-08-28 MED ORDER — ACETAMINOPHEN 500 MG OR TABS
1000.0000 mg | ORAL_TABLET | Freq: Once | ORAL | Status: AC
Start: 2021-08-28 — End: 2021-08-28
  Administered 2021-08-28: 1000 mg via ORAL
  Filled 2021-08-28: qty 2

## 2021-08-28 NOTE — ED Provider Notes (Signed)
CHIEF COMPLAINT   No chief complaint on file.           HISTORY OF PRESENT ILLNESS   HPI  80 yo F w/ PMHx HTN presents with a recent fall today amongst other complaints. Pt had fallen last Wednesday while using her walker, she was trying to ascend a step when she lost her balance and fell on her chest, also hitting her head. Later that week, the pt had an episode of shaking chills for 15 minutes and was visibly confused. She was also found to have a fever to 101.7, which resolved with ibuprofen. Then, yesterday, she had an episode of vomiting prior to falling again, and she was found down for a few hours. She hasn't had a fall for 5 years prior to this. She has very poor memory about all the events of the past week. She endorses fatigue and continued 3/10 central, nonradiating chest pain that is tender to palpation. She denies changes in appetite or weight, shortness of breath, dysuria, or cough.         PAST MEDICAL AND SURGICAL HISTORY   Past Medical History:   Diagnosis Date    Thyroid cancer ([place]) 2011    Breast mass 1990    Removed    Anemia     Little    Colon cancer ([place])     Sleep apnea in adult     "I stopped using my CPAP"                                           MEDICATIONS AND ALLERGIES     OUTPATIENT MEDICATIONS:   Current Outpatient Medications   Medication Instructions    acetaminophen (TYLENOL) 1,000 mg, Oral, Every 6 hours PRN    amLODIPine (NORVASC) 10 mg, Oral, Daily    iron 325 mg, Oral, Daily    levothyroxine (SYNTHROID; LEVOXYL) 137 mcg, Oral, Daily empty stomach    Na sulfate-K sulfate-Mg sulfate (Suprep Bowel Prep Kit) solution Follow split prep instructions provided by [facility]    urea 20 % cream Apply topically to hands and feet 3 times a day as needed for dry skin.       ALLERGIES:   Atorvastatin, Ciprofloxacin, Niacin and related, Rosuvastatin, and Sulfa antibiotics          SOCIAL HISTORY   Social History     Tobacco Use    Smoking status: Never    Smokeless tobacco: Never    Substance Use Topics    Alcohol use: Not Currently    Drug use: Never                        PAST FAMILY HISTORY   Family History     Problem (# of Occurrences) Relation (Name,Age of Onset)    Breast Cancer (1) Sister    Kidney Cancer (1) Brother    Rheumatoid Arthritis (1) Father                     REVIEW OF SYSTEMS   Review of Systems  10/14 Review of Systems completed and negative except as stated above in the HPI (Systems reviewed: GEN, HENT, Eyes, Resp, CV, GI, GU, MSK, Skin, Neuro)            PHYSICAL EXAM   ED VITALS:  Vitals (Arrival)  T: 36.3 C (08/28/21 1756)  BP: 121/64 (08/28/21 1756)  HR: 72 (08/28/21 1756)  RR: 18 (08/28/21 1756)  SpO2: 96 % (08/28/21 1756) Room air   Vitals (Most recent in last 24 hrs)   T: 36.3 C (08/28/21 1756)  BP: 121/64 (08/28/21 1756)  HR: 72 (08/28/21 1756)  RR: 18 (08/28/21 1756)  SpO2: 96 % (08/28/21 1756) Room air  T range: Temp  Min: 36.3 C  Max: 36.3 C  (no weight taken for this visit)     (no height taken for this visit)     There is no height or weight on file to calculate BMI.           Physical Exam  General: well appearing, no acute distress  HEENT: normocephalic, atraumatic. EOMI, PERRL. No bruising, tenderness, or edema of the scalp  Cardio: RRR, no murmurs, rubs, gallops  Pulm: CTAB, no wheezes or crackles  Abdomen: nontender, active bowel sounds  Extrem: moves all, bilateral LE 2+ pitting edema. With right arm bruising around the elbow.  Neuro: alert and oriented to person and time. CN 2-12 grossly intact. 5/5 strength in upper and lower extremities. Finger to nose intact, with difficulty with rapid alternating movements.        LABORATORY:   Labs Reviewed   CBC, DIFF - Abnormal       Result Value    WBC 9.54      RBC 5.21 (*)     Hemoglobin 15.2      Hematocrit 45      MCV 85      MCH 29.2      MCHC 34.2      Platelet Count 171      RDW-CV 13.2      % Neutrophils 78      % Lymphocytes 13      % Monocytes 9      % Eosinophils 0      % Basophils 0       % Immature Granulocytes 0      Neutrophils 7.37 (*)     Absolute Lymphocyte Count 1.22      Monocytes 0.85 (*)     Absolute Eosinophil Count 0.03      Basophils 0.03      Immature Granulocytes 0.04      Nucleated RBC 0.00      % Nucleated RBC 0     COMPREHENSIVE METABOLIC PANEL - Abnormal    Sodium 136      Potassium 3.2 (*)     Chloride 101      Carbon Dioxide, Total 27      Anion Gap 8      Glucose 121      Urea Nitrogen 13      Creatinine 0.75      Protein (Total) 7.0      Albumin 4.1      Bilirubin (Total) 1.5 (*)     Calcium 8.6 (*)     AST (GOT) 19      Alkaline Phosphatase (Total) 82      ALT (GPT) 20      eGFR by CKD-EPI 2021 >60     GLUCOSE POC, [place] - Abnormal    Glucose (POC) 131 (*)    L LACTATE, VENOUS PLASMA (TO U/H LAB > 30 MIN)    L Lactate (Indirect), VEN 1.4     1ST EXTRA GRAY TOP    1st Extra [facility] Rx Additional collection tube  URINALYSIS WITH REFLEX CULTURE   CK, CREATINE KINASE, TOTAL ACTIVITY   TROPONIN_I   TROPONIN_I   RAPID FLU A & B, RSV, SARS-COV-2 PCR   B_TYPE NATRIURETIC PEPTIDE   BLOOD C/S   BLOOD C/S         IMAGING:     ED Wet Read -   XR Chest 1 View    (Results Pending)   CT Head wo Contrast    (Results Pending)       Radiology Final Result -   No image results found.              [place]            ED COURSE/MEDICAL DECISION MAKING     ED Course as of 08/29/21 0056   Sun Aug 28, 2021   2009 Pt signed out to me- here for fall yesterday, down for a couple hours. Also had a fall last week w/ some chest wall bruising since then and a fever on Friday that self-resolved. Has had full infectious and chest pain workup ordered that is negative.   She lives with son, if workup is all negative she likely will be safe to go home w/ son  [MD]   2308 Initial troponin negative, BNP slightly elevated. [MD]   Mon Aug 29, 2021   7414 Repeat troponin negative.  Patient is feeling well and requested to be discharged.  This was done. [MD]      ED Course User Index  [MD] Teressa Lower,  MD    80 yo F w/ PMHx HTN presents with multiple complains including 2 falls, vomiting, shaking chills for 15 minutes, confusion, memory loss, and fever to 101.7. Symptoms of fevers and chills is concerning for infection of unknown etiology, and the recent falls are concerning for head trauma with possible head bleed leading to confusion, vomiting, and recurrent fall. EKG unremarkable. We obtained a workup including CT head, CXR, bcx, UA/UCx, lactate, CBC, CMP, CK, Trop, BNP flu/covid swab. This was significant for a low potassium of 3.2 (repleted) and a nonelevated WBC to 9.5.                      CLINICAL IMPRESSION/DISPOSITION   Clinical Impressions:   [W19.ELTR] Fall, initial encounter   [R50.9] Fever, unspecified fever cause         Disposition: Data Unavailable         CRITICAL CARE - ATTENDING ONLY            ADDITIONAL INFORMATION REVIEWED  - ATTENDING Dannette Barbara, MD  Resident  08/28/21 Little Ishikawa, MD  Resident  08/28/21 [phone]

## 2021-08-28 NOTE — ED Triage Notes (Signed)
Pt comes in with multiple falls over the last week. Pt states also having decreased short term memory. Pt states that she had a fall last night and was found down on the ground today. Pt with multiple episodes of emesis. Pt states she was unaware of what happened.

## 2021-08-29 LAB — EKG 12 LEAD
Atrial Rate: 67 {beats}/min
Diagnosis: NORMAL
P Axis: 48 degrees
P-R Interval: 150 ms
Q-T Interval: 400 ms
QRS Duration: 74 ms
QTC Calculation: 422 ms
R Axis: 51 degrees
T Axis: 12 degrees
Ventricular Rate: 67 {beats}/min

## 2021-08-29 LAB — TROPONIN_I
Troponin_I Interpretation: NORMAL
Troponin_I: <0.03 ng/mL (ref <0.04)

## 2021-08-29 NOTE — Discharge Instructions (Signed)
You were seen in the emergency department for evaluation after a fall.  You were evaluated with blood tests, CT scan which showed no significant injuries or other abnormalities.You should follow-up with your primary care provider to discuss ways to prevent frequent falls.  If you have worsening chest pain, shortness of breath, additional fall, any other concerning symptom please return to emergency department.

## 2021-09-02 LAB — BLOOD C/S
Culture: NO GROWTH
Culture: NO GROWTH

## 2021-09-08 LAB — OTHER LAB ORDER
Hemoglobin A1C: 5.7 % — ABNORMAL HIGH (ref 4.8–5.6)
Mean Blood Glucose Estimate: 117 mg/dL

## 2021-12-06 ENCOUNTER — Encounter (HOSPITAL_BASED_OUTPATIENT_CLINIC_OR_DEPARTMENT_OTHER): Payer: Self-pay

## 2021-12-15 ENCOUNTER — Encounter (HOSPITAL_BASED_OUTPATIENT_CLINIC_OR_DEPARTMENT_OTHER): Payer: Self-pay

## 2021-12-26 ENCOUNTER — Ambulatory Visit
Admit: 2021-12-26 | Discharge: 2021-12-26 | Disposition: A | Discharge status: Home / Self Care | Payer: Medicare Other | Attending: Student in an Organized Health Care Education/Training Program | Admitting: Student in an Organized Health Care Education/Training Program

## 2021-12-26 DIAGNOSIS — C182 Malignant neoplasm of ascending colon: Secondary | ICD-10-CM | POA: Insufficient documentation

## 2021-12-26 LAB — CBC, DIFF
% Basophils: 1 %
% Eosinophils: 2 %
% Immature Granulocytes: 0 %
% Lymphocytes: 28 %
% Monocytes: 7 %
% Neutrophils: 62 %
% Nucleated RBC: 0 %
Absolute Eosinophil Count: 0.1 10*3/uL (ref 0.00–0.50)
Absolute Lymphocyte Count: 1.8 10*3/uL (ref 1.00–4.80)
Basophils: 0.03 10*3/uL (ref 0.00–0.20)
Hematocrit: 44 % (ref 36.0–45.0)
Hemoglobin: 15 g/dL (ref 11.5–15.5)
Immature Granulocytes: 0.02 10*3/uL (ref 0.00–0.05)
MCH: 28.7 pg (ref 27.3–33.6)
MCHC: 34.2 g/dL (ref 32.2–36.5)
MCV: 84 fL (ref 81–98)
Monocytes: 0.44 10*3/uL (ref 0.00–0.80)
Neutrophils: 4.14 10*3/uL (ref 1.80–7.00)
Nucleated RBC: 0 10*3/uL
Platelet Count: 233 10*3/uL (ref 150–400)
RBC: 5.23 10*6/uL — ABNORMAL HIGH (ref 3.80–5.00)
RDW-CV: 13.2 % (ref 11.0–14.5)
WBC: 6.53 10*3/uL (ref 4.3–10.0)

## 2021-12-26 LAB — COMPREHENSIVE METABOLIC PANEL
ALT (GPT): 19 U/L (ref 7–33)
AST (GOT): 14 U/L (ref 9–38)
Albumin: 4.2 g/dL (ref 3.5–5.2)
Alkaline Phosphatase (Total): 92 U/L (ref 49–199)
Anion Gap: 7 (ref 4–12)
Bilirubin (Total): 0.6 mg/dL (ref 0.2–1.3)
Calcium: 9.2 mg/dL (ref 8.9–10.2)
Carbon Dioxide, Total: 27 meq/L (ref 22–32)
Chloride: 105 meq/L (ref 98–108)
Creatinine: 0.73 mg/dL (ref 0.38–1.02)
Glucose: 88 mg/dL (ref 62–125)
Potassium: 3.8 meq/L (ref 3.6–5.2)
Protein (Total): 6.8 g/dL (ref 6.0–8.2)
Sodium: 139 meq/L (ref 135–145)
Urea Nitrogen: 14 mg/dL (ref 8–21)
eGFR by CKD-EPI 2021: >60 mL/min/{1.73_m2} (ref >59)

## 2021-12-26 LAB — CARCINOEMBRYONIC ANTIGEN: Carcinoembryonic Antigen: 2.8 ng/mL (ref 0.0–5.0)

## 2021-12-26 MED ORDER — IOHEXOL 9 MG/ML OR SOLN
500.0000 mL | Freq: Once | ORAL | Status: AC | PRN
Start: 2021-12-26 — End: 2021-12-26
  Administered 2021-12-26: 500 mL via ORAL

## 2021-12-26 MED ORDER — IOHEXOL 350 MG/ML IV SOLN
96.0000 mL | Freq: Once | INTRAVENOUS | Status: AC | PRN
Start: 2021-12-26 — End: 2021-12-26
  Administered 2021-12-26: 96 mL via INTRAVENOUS

## 2021-12-30 ENCOUNTER — Encounter (HOSPITAL_BASED_OUTPATIENT_CLINIC_OR_DEPARTMENT_OTHER): Payer: Self-pay

## 2022-01-02 ENCOUNTER — Encounter (HOSPITAL_BASED_OUTPATIENT_CLINIC_OR_DEPARTMENT_OTHER): Payer: Medicare Other | Admitting: Hematology & Oncology

## 2022-01-03 ENCOUNTER — Ambulatory Visit: Payer: Medicare Other | Attending: Hematology & Oncology | Admitting: Hematology & Oncology

## 2022-01-03 VITALS — BP 148/76 | HR 80 | Temp 97.9°F | Resp 16 | Wt 155.4 lb

## 2022-01-03 DIAGNOSIS — C182 Malignant neoplasm of ascending colon: Secondary | ICD-10-CM | POA: Insufficient documentation

## 2022-01-03 DIAGNOSIS — Z85038 Personal history of other malignant neoplasm of large intestine: Secondary | ICD-10-CM | POA: Insufficient documentation

## 2022-01-03 NOTE — Progress Notes (Signed)
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 6//21 Colonoscopy: mass in cecum inv IC valve. 04/06/20 CT CAP: 7.3 x 6.7 cecal mass inv TI, local LN to 21 x 14 x 666m, no distant mets. CEA 2.5.  2. 04/16/20. R hemicolectomy ([facility] [place]. pT3N2a. 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 06/01/20 - 10/04/20. Adjuvant capecitabine x5 cycles. Initial cape 15071mBID. C4: 1000/1500 for fatigue. C5: 1000/1000 for fatigue. Stopped after C5 d/t toxicity (fatigue).   4. 10/2020 - present. Surveillance. 10/16/20 CT: NED, new PVT of R ant subseg, R post, resolved mes LN, st sub-66m366mung nodules. 01/30/21 CT: NED, st lung nodules, chronic R PVT. 05/17/21 Colonoscopy: 5 dim TA/HP in rectum, sig, desc, transverse, hep flexure. 9/21 ER for abd pain, rectal bleeding; +UTI. 12/26/21 CT: NED    INTERVAL EVENTS  Olivia Mcconnell says that she has 3-4 BM/day. They can be loose at times. Unsure if it is dietary. Energy has been good.     ECOG PERFORMANCE STATUS  ECOG: (1) Restricted in physically strenuous activity, ambulatory and able to do work of light nature     ROSAshlandrm given to patient for completion, with pertinent findings noted above. Complete review of systems otherwise negative.    PROBLEM LIST  Patient Active Problem List   Diagnosis   . 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 Osteoporosis   . Hypertension   . Dyslipidemia   .  Hypothyroidism, postsurgical   . Acute cystitis without hematuria   . Malignant neoplasm of ascending colon (HCC[place] . Cecal cancer (HCC[place]rape [place] . Acquired pes planus of both feet   . Anxiety   . Chronic right-sided low back pain without sciatica   . Diverticulosis   . Hiatal hernia   . History of dizziness   . Hyperlipidemia   . Insomnia due to medical condition   . Iron deficiency anemia   . Malignant neoplasm of breast (HCCCas[place] . NPH (normal pressure hydrocephalus) (HCCGreens [place] . Sleep apnea   . Other chronic pain   . Polyp of colon   . Postherpetic neuralgia   . Presence of artificial knee joint, right   . Primary adenocarcinoma of colon (HCCDrum [place] . Pronation deformity of both feet   . Right lower quadrant abdominal pain   . Thyroid cancer (HCCTalent . Urge incontinence       PAST MEDICAL HISTORY  Past Medical History:   Diagnosis Date   . Thyroid cancer (HCCHomedale011   . Breast mass 1990    Removed   . Anemia     Little   . Colon cancer (HCCShel[place]  . Sleep apnea in adult     "I stopped using my CPAP"   Breast cancer  - R mastectomy 1990  Thyroid cancer  - total  thyroidectomy 2011    Past Surgical History:   Procedure Laterality Date   . COLONOSCOPY  04/01/2020   . PR APPENDECTOMY  1990s   . PR UNLISTED PROCEDURE BREAST Right 1990    Masectomy   . THYROID SURGERY  2011    total thyroidectomy   . TOTAL KNEE ARTHROPLASTY Left 09/2014       FAMILY HISTORY  Family History     Problem (# of Occurrences) Relation (Name,Age of Onset)    Breast Cancer (1) Sister    Kidney Cancer (1) Brother    Rheumatoid Arthritis (1) Father          SOCIAL HISTORY  Originally from the [place], lived in [place], [place] prior to moving to [place] about 5 years ago to be closer to her two sons Olivia Mcconnell). She now lives close to Olivia Mcconnell. Never smoker, occasional alcohol      ALLERGIES  Review of patient's allergies indicates:  Allergies   Allergen Reactions   . Atorvastatin Palpitations   . Ciprofloxacin Unknown   . Niacin And Related Flushing   . Rosuvastatin  Palpitations     Palp.   . Sulfa Antibiotics Unknown       HOME MEDICATIONS   Current Outpatient Medications:   .  acetaminophen 500 MG tablet, Take 2 tablets (1,000 mg) by mouth every 6 hours as needed for pain or severe pain. (Patient not taking: Reported on 01/31/2021), Disp: 40 tablet, Rfl: 0  .  amLODIPine 10 MG tablet, Take 1 tablet (10 mg) by mouth daily., Disp: 7 tablet, Rfl: 0  .  Ferrous Sulfate (iron) 325 (65 Fe) MG tablet, Take 1 tablet (325 mg) by mouth daily. (Patient not taking: Reported on 01/03/2022), Disp: 7 tablet, Rfl: 0  .  levothyroxine 137 MCG tablet, Take 1 tablet (137 mcg) by mouth daily on an empty stomach., Disp: 7 tablet, Rfl: 0  .  Na sulfate-K sulfate-Mg sulfate (Suprep Bowel Prep Kit) solution, Follow split prep instructions provided by [facility] (Patient not taking: Reported on 01/03/2022), Disp: 354 mL, Rfl: 0  .  urea 20 % cream, Apply topically to hands and feet 3 times a day as needed for dry skin. (Patient not taking: Reported on 01/03/2022), Disp: 480 g, Rfl: 3     PHYSICAL EXAMINATION  VITAL SIGNS: BP (!) 148/76   Pulse 80   Temp 36.6 C (Oral)   Resp 16   Wt 70.5 kg (155 lb 6.8 oz)   SpO2 95%   BMI 28.43 kg/m      Wt Readings from Last 4 Encounters:   01/03/22 70.5 kg (155 lb 6.8 oz)   05/23/21 71.7 kg (158 lb)   05/17/21 70.3 kg (155 lb)   05/09/21 70.7 kg (155 lb 12.8 oz)       GENERAL: well-appearing, no acute distress  HEENT: sclera anicteric, normal pupils, mask in place  EXT: warm and well perfused, no edema  NEURO: grossly normal, narrow-based gait  SKIN: bruise at venipuncture site in L arm, no other rashes, lesions, or jaundice      LABORATORY STUDIES  No results found for any visits on 01/03/22.    Recent Labs     12/26/21  1633 05/23/21  1500 01/28/21  1353 09/20/20  1238 04/09/20  1611   CEA 2.8 3.0 3.2 3.4 2.5         PLAN:  1) Colon cancer  [name] has a medullary cancer, which is a rarer subtype. Surprisingly, her tumor  is MSS with low TMB. After resection she  elected adjuvant treatment with capecitabine monotherapy to mitigate toxicities. Our initial aim was for at least 3 months of treatment. She ended up receiving 5 cycles and then electing to stop. She remains clinically NED on recent scans. As she is about 2 years out and NED, can decrease to q6 month visits. Continue surveillance with:  - visits and labs q3-->6 mo x 3 years (through  04/2025)   - CEA not elevated at baseline  - CT annually - due 12/2022  - colonoscopy - due 04/2022-25    2) Treatment: surveillance    3) Nutrition  Consider fiber supplement to help with her looser stools and   Push PO hydration before appt to minimize issues with blood draws    4) Pain  None discussed today    RTC in 6 months    This note was completed at least in part with voice recognition software.

## 2022-03-21 LAB — OTHER LAB ORDER
Hemoglobin A1C: 5.8 % — ABNORMAL HIGH (ref 4.8–5.6)
Mean Blood Glucose Estimate: 120 mg/dL

## 2022-07-14 ENCOUNTER — Encounter (HOSPITAL_BASED_OUTPATIENT_CLINIC_OR_DEPARTMENT_OTHER): Payer: Self-pay

## 2022-07-17 NOTE — Progress Notes (Unsigned)
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 6//21 Colonoscopy: mass in cecum inv IC valve. 04/06/20 CT CAP: 7.3 x 6.7 cecal mass inv TI, local LN to 21 x 14 x 5m, no distant mets. CEA 2.5.  04/16/20. R hemicolectomy ([facility] [place]. pT3N2a. 8cm inv poorly diff adenocarcinoma of the cecum with medullary features, +LVI, -PNI, R0, 4/13 LN+; MSS, low TMB  06/01/20 - 10/04/20. Adjuvant capecitabine x5 cycles. Initial cape 15034mBID. C4: 1000/1500 for fatigue. C5: 1000/1000 for fatigue. Stopped after C5 d/t toxicity (fatigue).   10/2020 - present. Surveillance. 10/16/20 CT: NED, new PVT of R ant subseg, R post, resolved mes LN, st sub-42m28mung nodules. 01/30/21 CT: NED, st lung nodules, chronic R PVT. 05/17/21 Colonoscopy: 5 dim TA/HP in rectum, sig, desc, transverse, hep flexure. 9/21 ER for abd pain, rectal bleeding; +UTI. 12/26/21 CT: NED    INTERVAL EVENTS  Olivia Mcconnell says that she is feeling pretty ok. Now just 1-2 BM/day. No longer having loose BM.   Energy is a little less.     ECOG PERFORMANCE STATUS  ECOG: (1) Restricted in physically strenuous activity, ambulatory and able to do work of light nature     ROSVailrm given to patient for completion, with pertinent findings noted above. Complete review of systems otherwise negative.    PROBLEM LIST  Patient Active Problem List   Diagnosis    Papillary thyroid carcinoma (HCCVernon  Osteoporosis    Hypertension    Dyslipidemia    Hypothyroidism, postsurgical    Acute  cystitis without hematuria    Malignant neoplasm of ascending colon (HCC)    Cecal cancer (HCC)    Acquired pes planus of both feet    Anxiety    Chronic right-sided low back pain without sciatica    Diverticulosis    Hiatal hernia    History of dizziness    Hyperlipidemia    Insomnia due to medical condition    Iron deficiency anemia    Malignant neoplasm of breast (HCC)    NPH (normal pressure hydrocephalus) (HCC)    Sleep apnea    Other chronic pain    Polyp of colon    Postherpetic neuralgia    Presence of artificial knee joint, right    Primary adenocarcinoma of colon (HCCKre[place]  Pronation deformity of both feet    Right lower quadrant abdominal pain    Thyroid cancer (HCCWilliamson  Urge incontinence       PAST MEDICAL HISTORY  Past Medical History:   Diagnosis Date    Thyroid cancer (HCCCanadian011    Breast mass 1990    Removed    Anemia     Little    Colon cancer (HCCWalker   Sleep apnea in adult     "I stopped using my CPAP"   Breast cancer  - R mastectomy 1990  Thyroid cancer  - total thyroidectomy 2011  Past Surgical History:   Procedure Laterality Date    COLONOSCOPY  04/01/2020    PR APPENDECTOMY  1990s    PR UNLISTED PROCEDURE BREAST Right 1990    [place]    THYROID SURGERY  2011    total thyroidectomy    TOTAL KNEE ARTHROPLASTY Left 09/2014       FAMILY HISTORY  Family History       Problem (# of Occurrences) Relation (Name,Age of Onset)    Breast Cancer (1) Sister    Kidney Cancer (1) Brother    Rheumatoid Arthritis (1) Father            SOCIAL HISTORY  Originally from the [place], lived in [place], [place] prior to moving to [place] about 5 years ago to be closer to her two sons Olivia Mcconnell). She now lives close to [facility]. Never smoker, occasional alcohol. 7 grandkids      ALLERGIES  Review of patient's allergies indicates:  Allergies   Allergen Reactions    Atorvastatin Palpitations    Ciprofloxacin Unknown    Niacin And Related Flushing    Rosuvastatin Palpitations     Palp.    Sulfa Antibiotics Unknown       HOME  MEDICATIONS   Current Outpatient Medications:     acetaminophen 500 MG tablet, Take 2 tablets (1,000 mg) by mouth every 6 hours as needed for pain or severe pain. (Patient not taking: Reported on 01/31/2021), Disp: 40 tablet, Rfl: 0    amLODIPine 10 MG tablet, Take 1 tablet (10 mg) by mouth daily., Disp: 7 tablet, Rfl: 0    Ferrous Sulfate (iron) 325 (65 Fe) MG tablet, Take 1 tablet (325 mg) by mouth daily. (Patient not taking: Reported on 01/03/2022), Disp: 7 tablet, Rfl: 0    levothyroxine 137 MCG tablet, Take 1 tablet (137 mcg) by mouth daily on an empty stomach., Disp: 7 tablet, Rfl: 0    losartan 25 MG tablet, TAKE 1 TABLET BY MOUTH EVERY DAY WITH 50MG TABLET FOR TOTAL 75MG PER DAY, Disp: , Rfl:     losartan 50 MG tablet, TAKE 1 TABLET BY MOUTH EVERY DAY WITH 25MG TABLET FOR TOTAL 75MG PER DAY, Disp: , Rfl:     Na sulfate-K sulfate-Mg sulfate (Suprep Bowel Prep Kit) solution, Follow split prep instructions provided by [facility] (Patient not taking: Reported on 01/03/2022), Disp: 354 mL, Rfl: 0    urea 20 % cream, Apply topically to hands and feet 3 times a day as needed for dry skin. (Patient not taking: Reported on 01/03/2022), Disp: 480 g, Rfl: 3     PHYSICAL EXAMINATION  VITAL SIGNS: BP (!) 157/99   Pulse 66   Temp 36.8 C (Oral)   Resp 15   Wt 68.9 kg (152 lb)   SpO2 95%   BMI 27.80 kg/m      Wt Readings from Last 4 Encounters:   07/18/22 68.9 kg (152 lb)   01/03/22 70.5 kg (155 lb 6.8 oz)   05/23/21 71.7 kg (158 lb)   05/17/21 70.3 kg (155 lb)       GENERAL: well-appearing, no acute distress  HEENT: sclera anicteric, normal pupils, mask in place  EXT: warm and well perfused, no edema  NEURO: grossly normal, narrow-based gait  SKIN: no rashes, lesions, or jaundice      LABORATORY STUDIES  Lab on 07/18/22   1. CBC with Differential   Result Value Ref Range    WBC 6.08 4.3 - 10.0 10*3/uL  RBC 5.15 (H) 3.80 - 5.00 10*6/uL    Hemoglobin 14.9 11.5 - 15.5 g/dL    Hematocrit 44 36.0 - 45.0 %    MCV 85 81 - 98 fL     MCH 28.9 27.3 - 33.6 pg    MCHC 33.9 32.2 - 36.5 g/dL    Platelet Count 215 150 - 400 10*3/uL    RDW-CV 13.2 11.0 - 14.5 %    % Neutrophils 68 %    % Lymphocytes 24 %    % Monocytes 6 %    % Eosinophils 1 %    % Basophils 1 %    % Immature Granulocytes 0 %    Neutrophils 4.10 1.80 - 7.00 10*3/uL    Absolute Lymphocyte Count 1.48 1.00 - 4.80 10*3/uL    Monocytes 0.35 0.00 - 0.80 10*3/uL    Absolute Eosinophil Count 0.08 0.00 - 0.50 10*3/uL    Basophils 0.05 0.00 - 0.20 10*3/uL    Immature Granulocytes 0.02 0.00 - 0.05 10*3/uL    Nucleated RBC 0.00 0.00 10*3/uL    % Nucleated RBC 0 %   2. Comprehensive Metabolic Panel   Result Value Ref Range    Sodium 140 135 - 145 meq/L    Potassium 4.6 3.6 - 5.2 meq/L    Chloride 106 98 - 108 meq/L    Carbon Dioxide, Total 28 22 - 32 meq/L    Anion Gap 6 4 - 12    Glucose 113 62 - 125 mg/dL    Urea Nitrogen 14 8 - 21 mg/dL    Creatinine 0.75 0.38 - 1.02 mg/dL    Protein (Total) 6.6 6.0 - 8.2 g/dL    Albumin 4.2 3.5 - 5.2 g/dL    Bilirubin (Total) 1.0 0.2 - 1.3 mg/dL    Calcium 9.5 8.9 - 10.2 mg/dL    AST (GOT) 13 9 - 38 U/L    Alkaline Phosphatase (Total) 67 49 - 199 U/L    ALT (GPT) 17 7 - 33 U/L    eGFR by CKD-EPI 2021 >60 >59 mL/min/1.73_m2       Recent Labs     12/26/21  1633 05/23/21  1500 01/28/21  1353 09/20/20  1238   CEA 2.8 3.0 3.2 3.4         PLAN:  1) Colon cancer  Joice has a medullary cancer, which is a rarer subtype. Surprisingly, her tumor is MSS with low TMB. After resection she elected adjuvant treatment with capecitabine monotherapy to mitigate toxicities. Our initial aim was for at least 3 months of treatment. She ended up receiving 5 cycles and then electing to stop. She remains clinically NED on recent scans. As she is about 2 years out and NED, can decrease to q6 month visits. Continue surveillance with:  - visits and labs q6 mo x 3 years (through  04/2025)   - CEA not elevated at baseline  - CT annually - due 12/2022 -- ordered   - colonoscopy - due  04/2022-25    2) Treatment: surveillance    3) Nutrition  Improved     4) Pain  None discussed today    RTC in 6 months    This note was completed at least in part with voice recognition software.

## 2022-07-18 ENCOUNTER — Ambulatory Visit: Payer: Medicare Other | Attending: Hematology & Oncology | Admitting: Hematology & Oncology

## 2022-07-18 ENCOUNTER — Ambulatory Visit (HOSPITAL_BASED_OUTPATIENT_CLINIC_OR_DEPARTMENT_OTHER): Payer: Medicare Other

## 2022-07-18 DIAGNOSIS — C182 Malignant neoplasm of ascending colon: Secondary | ICD-10-CM

## 2022-07-18 LAB — COMPREHENSIVE METABOLIC PANEL
ALT (GPT): 17 U/L (ref 7–33)
AST (GOT): 13 U/L (ref 9–38)
Albumin: 4.2 g/dL (ref 3.5–5.2)
Alkaline Phosphatase (Total): 67 U/L (ref 49–199)
Anion Gap: 6 (ref 4–12)
Bilirubin (Total): 1 mg/dL (ref 0.2–1.3)
Calcium: 9.5 mg/dL (ref 8.9–10.2)
Carbon Dioxide, Total: 28 meq/L (ref 22–32)
Chloride: 106 meq/L (ref 98–108)
Creatinine: 0.75 mg/dL (ref 0.38–1.02)
Glucose: 113 mg/dL (ref 62–125)
Potassium: 4.6 meq/L (ref 3.6–5.2)
Protein (Total): 6.6 g/dL (ref 6.0–8.2)
Sodium: 140 meq/L (ref 135–145)
Urea Nitrogen: 14 mg/dL (ref 8–21)
eGFR by CKD-EPI 2021: >60 mL/min/{1.73_m2} (ref >59)

## 2022-07-18 LAB — CARCINOEMBRYONIC ANTIGEN: Carcinoembryonic Antigen: 2.9 ng/mL (ref 0.0–5.0)

## 2022-07-18 LAB — CBC, DIFF
% Basophils: 1 %
% Eosinophils: 1 %
% Immature Granulocytes: 0 %
% Lymphocytes: 24 %
% Monocytes: 6 %
% Neutrophils: 68 %
% Nucleated RBC: 0 %
Absolute Eosinophil Count: 0.08 10*3/uL (ref 0.00–0.50)
Absolute Lymphocyte Count: 1.48 10*3/uL (ref 1.00–4.80)
Basophils: 0.05 10*3/uL (ref 0.00–0.20)
Hematocrit: 44 % (ref 36.0–45.0)
Hemoglobin: 14.9 g/dL (ref 11.5–15.5)
Immature Granulocytes: 0.02 10*3/uL (ref 0.00–0.05)
MCH: 28.9 pg (ref 27.3–33.6)
MCHC: 33.9 g/dL (ref 32.2–36.5)
MCV: 85 fL (ref 81–98)
Monocytes: 0.35 10*3/uL (ref 0.00–0.80)
Neutrophils: 4.1 10*3/uL (ref 1.80–7.00)
Nucleated RBC: 0 10*3/uL
Platelet Count: 215 10*3/uL (ref 150–400)
RBC: 5.15 10*6/uL — ABNORMAL HIGH (ref 3.80–5.00)
RDW-CV: 13.2 % (ref 11.0–14.5)
WBC: 6.08 10*3/uL (ref 4.3–10.0)

## 2023-01-17 ENCOUNTER — Ambulatory Visit
Admission: RE | Admit: 2023-01-17 | Discharge: 2023-01-17 | Disposition: A | Discharge status: Home / Self Care | Payer: Medicare Other | Attending: Diagnostic Radiology | Admitting: Diagnostic Radiology

## 2023-01-17 DIAGNOSIS — C182 Malignant neoplasm of ascending colon: Secondary | ICD-10-CM | POA: Insufficient documentation

## 2023-01-17 LAB — CBC, DIFF
% Basophils: 1 %
% Eosinophils: 1 %
% Immature Granulocytes: 0 %
% Lymphocytes: 21 %
% Monocytes: 7 %
% Neutrophils: 70 %
% Nucleated RBC: 0 %
Absolute Eosinophil Count: 0.08 10*3/uL (ref 0.00–0.50)
Absolute Lymphocyte Count: 1.17 10*3/uL (ref 1.00–4.80)
Basophils: 0.05 10*3/uL (ref 0.00–0.20)
Hematocrit: 43 % (ref 36.0–45.0)
Hemoglobin: 14.8 g/dL (ref 11.5–15.5)
Immature Granulocytes: 0.01 10*3/uL (ref 0.00–0.05)
MCH: 29 pg (ref 27.3–33.6)
MCHC: 34.3 g/dL (ref 32.2–36.5)
MCV: 85 fL (ref 81–98)
Monocytes: 0.38 10*3/uL (ref 0.00–0.80)
Neutrophils: 3.97 10*3/uL (ref 1.80–7.00)
Nucleated RBC: 0 10*3/uL
Platelet Count: 166 10*3/uL (ref 150–400)
RBC: 5.11 10*6/uL — ABNORMAL HIGH (ref 3.80–5.00)
RDW-CV: 13 % (ref 11.0–14.5)
WBC: 5.66 10*3/uL (ref 4.3–10.0)

## 2023-01-17 LAB — CARCINOEMBRYONIC ANTIGEN: Carcinoembryonic Antigen: 3.5 ng/mL (ref 0.0–5.0)

## 2023-01-17 LAB — COMPREHENSIVE METABOLIC PANEL
ALT (GPT): 16 U/L (ref 7–33)
AST (GOT): 12 U/L (ref 9–38)
Albumin: 4.1 g/dL (ref 3.5–5.2)
Alkaline Phosphatase (Total): 71 U/L (ref 49–199)
Anion Gap: 6 (ref 4–12)
Bilirubin (Total): 0.6 mg/dL (ref 0.2–1.3)
Calcium: 9.2 mg/dL (ref 8.9–10.2)
Carbon Dioxide, Total: 29 meq/L (ref 22–32)
Chloride: 103 meq/L (ref 98–108)
Creatinine: 0.66 mg/dL (ref 0.38–1.02)
Glucose: 114 mg/dL (ref 62–125)
Potassium: 4.1 meq/L (ref 3.6–5.2)
Protein (Total): 6.5 g/dL (ref 6.0–8.2)
Sodium: 138 meq/L (ref 135–145)
Urea Nitrogen: 12 mg/dL (ref 8–21)
eGFR by CKD-EPI 2021: >60 mL/min/{1.73_m2} (ref >59)

## 2023-01-17 MED ORDER — IOHEXOL 9 MG/ML OR SOLN
500.0000 mL | Freq: Once | ORAL | Status: AC | PRN
Start: 2023-01-17 — End: 2023-01-17
  Administered 2023-01-17: 500 mL via ORAL

## 2023-01-17 MED ORDER — IOHEXOL 350 MG/ML IV SOLN
80.0000 mL | Freq: Once | INTRAVENOUS | Status: AC | PRN
Start: 2023-01-17 — End: 2023-01-17
  Administered 2023-01-17: 80 mL via INTRAVENOUS

## 2023-01-25 ENCOUNTER — Ambulatory Visit (HOSPITAL_BASED_OUTPATIENT_CLINIC_OR_DEPARTMENT_OTHER): Payer: Medicare Other

## 2023-01-25 NOTE — Progress Notes (Signed)
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 6//21 Colonoscopy: mass in cecum inv IC valve. 04/06/20 CT CAP: 7.3 x 6.7 cecal mass inv TI, local LN to 21 x 14 x 17mm, no distant mets. CEA 2.5.  04/16/20. R hemicolectomy Olivia Mcconnell). pT3N2a. 8cm inv poorly diff adenocarcinoma of the cecum with medullary features, +LVI, -PNI, R0, 4/13 LN+; MSS, low TMB  06/01/20 - 10/04/20. Adjuvant capecitabine x5 cycles. Initial cape  BID. C4: 1000/1500 for fatigue. C5: 1000/1000 for fatigue. Stopped after C5 d/t toxicity (fatigue).   10/2020 - present. Surveillance. 10/16/20 CT: NED, new PVT of R ant subseg, R post, resolved mes LN, st sub-58mm lung nodules. 01/30/21 CT: NED, st lung nodules, chronic R PVT. 05/17/21 Colonoscopy: 5 dim TA/HP in rectum, sig, desc, transverse, hep flexure. 9/21 ER for abd pain, rectal bleeding; +UTI. 12/26/21 CT: NED. 01/17/23 CT: NED, st lung nodules.    INTERVAL EVENTS  Olivia Mcconnell says that she had been feeling well.   In the last last 48 hrs, she had a new rash. No antibiotics recently. Thinks it could be a wool blanket  BM have been good.   Energy is a little lower than she would like. Her son says that she this could be from missed medications.    ECOG PERFORMANCE STATUS  ECOG: (1) Restricted in physically strenuous activity, ambulatory and able to do work of light nature     ROS  Complete review of systems otherwise negative.    PROBLEM LIST  Patient Active Problem List   Diagnosis     Papillary thyroid carcinoma (HCC)    Osteoporosis    Hypertension    Dyslipidemia    Hypothyroidism, postsurgical    Acute cystitis without hematuria    Malignant neoplasm of ascending colon (HCC)    Cecal cancer (HCC)    Acquired pes planus of both feet    Anxiety    Chronic right-sided low back pain without sciatica    Diverticulosis    Hiatal hernia    History of dizziness    Hyperlipidemia    Insomnia due to medical condition    Iron deficiency anemia    Malignant neoplasm of breast (HCC)    NPH (normal pressure hydrocephalus) (HCC)    Sleep apnea    Other chronic pain    Polyp of colon    Postherpetic neuralgia    Presence of artificial knee joint, right    Primary adenocarcinoma of colon (HCC)    Pronation deformity of both feet    Right lower quadrant abdominal pain    Thyroid cancer (HCC)    Urge incontinence       PAST MEDICAL HISTORY  Past Medical History:   Diagnosis Date    Thyroid cancer (HCC) 2011    Breast mass 1990    Removed    Anemia     Little    Colon cancer (HCC)     Sleep apnea in adult     "  I stopped using my CPAP"   Breast cancer  - R mastectomy 1990  Thyroid cancer  - total thyroidectomy 2011    Past Surgical History:   Procedure Laterality Date    COLONOSCOPY  04/01/2020    PR APPENDECTOMY  1990s    PR UNLISTED PROCEDURE BREAST Right 1990    Masectomy    THYROID SURGERY  2011    total thyroidectomy    TOTAL KNEE ARTHROPLASTY Left 09/2014       FAMILY HISTORY  Family History       Problem (# of Occurrences) Relation (Name,Age of Onset)    Breast Cancer (1) Sister    Kidney Cancer (1) Brother    Rheumatoid Arthritis (1) Father            SOCIAL HISTORY  Originally from the [address], lived in [place], [place] prior to moving to [place] about 5 years ago to be closer to her two sons Olivia Mcconnell). She now lives close to [facility]. Never smoker, occasional alcohol. 7 grandkids      ALLERGIES  Review of patient's allergies indicates:  Allergies   Allergen Reactions    Atorvastatin Palpitations     Ciprofloxacin Unknown    Niacin And Related Flushing    Rosuvastatin Palpitations     Palp.    Sulfa Antibiotics Unknown       HOME MEDICATIONS   Current Outpatient Medications:     acetaminophen 500 MG tablet, Take 2 tablets (1,000 mg) by mouth every 6 hours as needed for pain or severe pain. (Patient not taking: Reported on 01/31/2021), Disp: 40 tablet, Rfl: 0    amLODIPine 10 MG tablet, Take 1 tablet (10 mg) by mouth daily., Disp: 7 tablet, Rfl: 0    Ferrous Sulfate (iron) 325 (65 Fe) MG tablet, Take 1 tablet (325 mg) by mouth daily. (Patient not taking: Reported on 01/03/2022), Disp: 7 tablet, Rfl: 0    levothyroxine 137 MCG tablet, Take 1 tablet (137 mcg) by mouth daily on an empty stomach., Disp: 7 tablet, Rfl: 0    losartan 25 MG tablet, TAKE 1 TABLET BY MOUTH EVERY DAY WITH  TABLET FOR TOTAL  PER DAY, Disp: , Rfl:     losartan 50 MG tablet, TAKE 1 TABLET BY MOUTH EVERY DAY WITH  TABLET FOR TOTAL  PER DAY, Disp: , Rfl:     Na sulfate-K sulfate-Mg sulfate (Suprep Bowel Prep Kit) solution, Follow split prep instructions provided by [facility] (Patient not taking: Reported on 01/03/2022), Disp: 354 mL, Rfl: 0    urea 20 % cream, Apply topically to hands and feet 3 times a day as needed for dry skin. (Patient not taking: Reported on 01/03/2022), Disp: 480 g, Rfl: 3     PHYSICAL EXAMINATION  Wt Readings from Last 4 Encounters:   07/18/22 68.9 kg (152 lb)   01/03/22 70.5 kg (155 lb 6.8 oz)   05/23/21 71.7 kg (158 lb)   05/17/21 70.3 kg (155 lb)     GENERAL: well-appearing, no acute distress  NEURO: grossly normal  SKIN: no visible rashes  PSYCH: appropriate    LABORATORY STUDIES  No results found for any visits on 01/29/23.      Recent Labs     01/17/23  0956 07/18/22  1110 12/26/21  1633 05/23/21  1500 01/28/21  1353   CEA 3.5 2.9 2.8 3.0 3.2         PLAN:  1) Colon cancer  Olivia Mcconnell has a medullary cancer, which is  a rarer subtype. Surprisingly, her tumor is MSS with low TMB. After resection she elected  adjuvant treatment with capecitabine monotherapy to mitigate toxicities. Our initial aim was for at least 3 months of treatment. She ended up receiving 5 cycles and then electing to stop. She remains clinically NED on recent scans. As she is about 2 years out and NED, can decrease to q6 month visits. Continue surveillance with:  - visits and labs q6 mo x 3 years (through 04/2025)   - CEA not elevated at baseline  - CT annually - due 01/2024  - colonoscopy - due 04/2022-25 -- decided to defer for now    2) Treatment: surveillance  *Low energy - suspect non-cancer cause based on recent CT    3) Nutrition  Doing well    4) Pain  None discussed today    RTC in 6 months     This note was completed at least in part with voice recognition software.    Distant Site Telemedicine Encounter  I conducted this encounter via secure, live, face-to-face video conference with the patient. I reviewed the risks and benefits of telemedicine as pertinent to this visit and the patient agreed to proceed.    Provider Location: Off-site location (home, non-[place] location)  Patient Location: At home  Present with patient: Family member

## 2023-01-29 ENCOUNTER — Ambulatory Visit: Payer: Medicare Other | Admitting: Hematology & Oncology

## 2023-01-29 ENCOUNTER — Ambulatory Visit (HOSPITAL_BASED_OUTPATIENT_CLINIC_OR_DEPARTMENT_OTHER): Payer: Medicare Other | Admitting: Hematology & Oncology

## 2023-01-29 DIAGNOSIS — C182 Malignant neoplasm of ascending colon: Secondary | ICD-10-CM

## 2023-03-21 LAB — OTHER LAB ORDER
Hemoglobin A1C: 5.7 % — ABNORMAL HIGH (ref 4.8–5.6)
Mean Blood Glucose Estimate: 117 mg/dL

## 2023-06-18 ENCOUNTER — Encounter (HOSPITAL_BASED_OUTPATIENT_CLINIC_OR_DEPARTMENT_OTHER): Payer: Self-pay

## 2023-07-26 NOTE — Progress Notes (Signed)
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 6//21 Colonoscopy: mass in cecum inv IC valve. 04/06/20 CT CAP: 7.3 x 6.7 cecal mass inv TI, local LN to 21 x 14 x 17mm, no distant mets. CEA 2.5.  04/16/20. R hemicolectomy April Manson). pT3N2a. 8cm inv poorly diff adenocarcinoma of the cecum with medullary features, +LVI, -PNI, R0, 4/13 LN+; MSS, low TMB  06/01/20 - 10/04/20. Adjuvant capecitabine x5 cycles. Initial cape 1500mg  BID. C4: 1000/1500 for fatigue. C5: 1000/1000 for fatigue. Stopped after C5 d/t toxicity (fatigue).   10/2020 - present. Surveillance. 10/16/20 CT: NED, new PVT of R ant subseg, R post, resolved mes LN, st sub-26mm lung nodules. 01/30/21 CT: NED, st lung nodules, chronic R PVT. 05/17/21 Colonoscopy: 5 dim TA/HP in rectum, sig, desc, transverse, hep flexure. 9/21 ER for abd pain, rectal bleeding; +UTI. 12/26/21 CT: NED. 01/17/23 CT: NED, st lung nodules.    INTERVAL EVENTS  Malkie says that she has been well, but she has vomited the last 2 Saturday nights.  Has 1 glass of wine on those nights. NO different foods  A little constipated.   Energy low, but thinks that she does her usual activities. She is not walking as much.     ECOG PERFORMANCE STATUS  ECOG: (1) Restricted in physically strenuous activity, ambulatory and able to do work of light nature     ROS  Complete review of systems otherwise negative.    PROBLEM LIST  Patient Active Problem List   Diagnosis    Papillary thyroid carcinoma  (HCC)    Osteoporosis    Hypertension    Dyslipidemia    Hypothyroidism, postsurgical    Malignant neoplasm of ascending colon (HCC)    Cecal cancer (HCC)    Acquired pes planus of both feet    Anxiety    Chronic right-sided low back pain without sciatica    Diverticulosis    Hiatal hernia    History of dizziness    Hyperlipidemia    Insomnia due to medical condition    Iron deficiency anemia    Malignant neoplasm of breast (HCC)    NPH (normal pressure hydrocephalus) (HCC)    Sleep apnea    Other chronic pain    Polyp of colon    Postherpetic neuralgia    Presence of artificial knee joint, right    Primary adenocarcinoma of colon (HCC)    Pronation deformity of both feet    Right lower quadrant abdominal pain    Thyroid cancer (HCC)    Urge incontinence       PAST MEDICAL HISTORY  Past Medical History:   Diagnosis Date    Thyroid cancer (HCC) 2011    Breast mass 1990    Removed    Anemia     Little    Colon cancer (HCC)     Sleep apnea in adult     "I stopped using my CPAP"   Breast cancer  -  R mastectomy 1990  Thyroid cancer  - total thyroidectomy 2011    Past Surgical History:   Procedure Laterality Date    COLONOSCOPY  04/01/2020    PR APPENDECTOMY  1990s    PR UNLISTED PROCEDURE BREAST Right 1990    Masectomy    THYROID SURGERY  2011    total thyroidectomy    TOTAL KNEE ARTHROPLASTY Left 09/2014       FAMILY HISTORY  Family History       Problem (# of Occurrences) Relation (Name,Age of Onset)    Breast Cancer (1) Sister    Kidney Cancer (1) Brother    Rheumatoid Arthritis (1) Father            SOCIAL HISTORY  Originally from the [address], lived in [place], [place] prior to moving to [place] about 5 years ago to be closer to her two sons Jonny Ruiz). She now lives close to [facility]. Never smoker, occasional alcohol. 7 grandkids      ALLERGIES  Review of patient's allergies indicates:  Allergies   Allergen Reactions    Atorvastatin Palpitations    Ciprofloxacin Unknown    Niacin And Related Flushing    Rosuvastatin  Palpitations     Palp.    Sulfa Antibiotics Unknown       HOME MEDICATIONS   Current Outpatient Medications:     acetaminophen 500 MG tablet, Take 2 tablets (1,000 mg) by mouth every 6 hours as needed for pain or severe pain. (Patient not taking: Reported on 01/31/2021), Disp: 40 tablet, Rfl: 0    amLODIPine 10 MG tablet, Take 1 tablet (10 mg) by mouth daily., Disp: 7 tablet, Rfl: 0    Ferrous Sulfate (iron) 325 (65 Fe) MG tablet, Take 1 tablet (325 mg) by mouth daily. (Patient not taking: Reported on 01/03/2022), Disp: 7 tablet, Rfl: 0    levothyroxine 137 MCG tablet, Take 1 tablet (137 mcg) by mouth daily on an empty stomach., Disp: 7 tablet, Rfl: 0    losartan 25 MG tablet, TAKE 1 TABLET BY MOUTH EVERY DAY WITH 50MG  TABLET FOR TOTAL 75MG  PER DAY (Patient not taking: Reported on 07/30/2023), Disp: , Rfl:     losartan 50 MG tablet, TAKE 1 TABLET BY MOUTH EVERY DAY WITH 25MG  TABLET FOR TOTAL 75MG  PER DAY, Disp: , Rfl:     Na sulfate-K sulfate-Mg sulfate (Suprep Bowel Prep Kit) solution, Follow split prep instructions provided by [facility] (Patient not taking: Reported on 01/03/2022), Disp: 354 mL, Rfl: 0    urea 20 % cream, Apply topically to hands and feet 3 times a day as needed for dry skin. (Patient not taking: Reported on 01/03/2022), Disp: 480 g, Rfl: 3     PHYSICAL EXAMINATION  Wt Readings from Last 4 Encounters:   07/30/23 71.9 kg (158 lb 9.6 oz)   07/18/22 68.9 kg (152 lb)   01/03/22 70.5 kg (155 lb 6.8 oz)   05/23/21 71.7 kg (158 lb)     GENERAL: well-appearing, no acute distress  NEURO: grossly normal  SKIN: no visible rashes  PSYCH: appropriate    LABORATORY STUDIES  Lab on 07/30/23   1. CBC with Differential   Result Value Ref Range    WBC 6.27 4.3 - 10.0 10*3/uL    RBC 4.93 3.80 - 5.00 10*6/uL    Hemoglobin 14.4 11.5 - 15.5 g/dL    Hematocrit 43 57.8 - 45.0 %    MCV 87 81 - 98 fL    MCH 29.2 27.3 -  33.6 pg    MCHC 33.5 32.2 - 36.5 g/dL    Platelet Count 161 096 - 400 10*3/uL    RDW-CV 13.3 11.0 - 14.5 %    %  Neutrophils 63 %    % Lymphocytes 26 %    % Monocytes 7 %    % Eosinophils 2 %    % Basophils 1 %    % Immature Granulocytes 1 %    Neutrophils 4.03 1.80 - 7.00 10*3/uL    Absolute Lymphocyte Count 1.63 1.00 - 4.80 10*3/uL    Monocytes 0.42 0.00 - 0.80 10*3/uL    Absolute Eosinophil Count 0.12 0.00 - 0.50 10*3/uL    Basophils 0.04 0.00 - 0.20 10*3/uL    Immature Granulocytes 0.03 0.00 - 0.05 10*3/uL    Nucleated RBC 0.00 0.00 10*3/uL    % Nucleated RBC 0 %   2. Comprehensive Metabolic Panel   Result Value Ref Range    Sodium 138 135 - 145 meq/L    Potassium 3.9 3.6 - 5.2 meq/L    Chloride 104 98 - 108 meq/L    Carbon Dioxide, Total 26 22 - 32 meq/L    Anion Gap 8 4 - 12    Glucose 102 62 - 125 mg/dL    Urea Nitrogen 10 8 - 21 mg/dL    Creatinine 0.45 4.09 - 1.02 mg/dL    Protein (Total) 6.8 6.0 - 8.2 g/dL    Albumin 4.2 3.5 - 5.2 g/dL    Bilirubin (Total) 0.6 0.2 - 1.3 mg/dL    Calcium 9.1 8.9 - 81.1 mg/dL    AST (GOT) 14 9 - 38 U/L    Alkaline Phosphatase (Total) 62 49 - 199 U/L    ALT (GPT) 17 7 - 33 U/L    eGFR by CKD-EPI 2021 >60 >59 mL/min/1.73_m2   3. Carcinoembryonic Antigen   Result Value Ref Range    Carcinoembryonic Antigen 4.0 0.0 - 5.0 ng/mL         Recent Labs     07/30/23  1609 01/17/23  0956 07/18/22  1110 12/26/21  1633   CEA 4.0 3.5 2.9 2.8         PLAN:  1) Colon cancer  Hollin has a medullary cancer, which is a rarer subtype. Surprisingly, her tumor is MSS with low TMB. After resection she elected adjuvant treatment with capecitabine monotherapy to mitigate toxicities. Our initial aim was for at least 3 months of treatment. She ended up receiving 5 cycles and then electing to stop. She remains clinically NED on recent scans. As she is about 2 years out and NED, can decrease to q6 month visits. Continue surveillance with:  - visits and labs q6 mo x 3 years (through 04/2025)   - CEA not elevated at baseline  - CT annually - due 01/2024 -- ordered   - colonoscopy - due 04/2022-25 -- decided to defer for  now    2) Treatment: surveillance  *Low energy - encouraged light activity to help improve her energy    3) Nutrition  Doing well    4) Pain  None discussed today    RTC in 6 months     This note was completed at least in part with voice recognition software.

## 2023-07-30 ENCOUNTER — Ambulatory Visit (HOSPITAL_BASED_OUTPATIENT_CLINIC_OR_DEPARTMENT_OTHER): Payer: Medicare Other

## 2023-07-30 ENCOUNTER — Ambulatory Visit (HOSPITAL_BASED_OUTPATIENT_CLINIC_OR_DEPARTMENT_OTHER): Payer: Medicare Other | Admitting: Hematology & Oncology

## 2023-07-30 ENCOUNTER — Ambulatory Visit: Payer: Medicare Other | Attending: Hematology & Oncology

## 2023-07-30 DIAGNOSIS — C182 Malignant neoplasm of ascending colon: Secondary | ICD-10-CM | POA: Insufficient documentation

## 2023-07-30 DIAGNOSIS — R5383 Other fatigue: Secondary | ICD-10-CM | POA: Insufficient documentation

## 2023-07-30 DIAGNOSIS — Z23 Encounter for immunization: Secondary | ICD-10-CM | POA: Insufficient documentation

## 2023-07-30 LAB — COMPREHENSIVE METABOLIC PANEL
ALT (GPT): 17 U/L (ref 7–33)
AST (GOT): 14 U/L (ref 9–38)
Albumin: 4.2 g/dL (ref 3.5–5.2)
Alkaline Phosphatase (Total): 62 U/L (ref 49–199)
Anion Gap: 8 (ref 4–12)
Bilirubin (Total): 0.6 mg/dL (ref 0.2–1.3)
Calcium: 9.1 mg/dL (ref 8.9–10.2)
Carbon Dioxide, Total: 26 meq/L (ref 22–32)
Chloride: 104 meq/L (ref 98–108)
Creatinine: 0.71 mg/dL (ref 0.38–1.02)
Glucose: 102 mg/dL (ref 62–125)
Potassium: 3.9 meq/L (ref 3.6–5.2)
Protein (Total): 6.8 g/dL (ref 6.0–8.2)
Sodium: 138 meq/L (ref 135–145)
Urea Nitrogen: 10 mg/dL (ref 8–21)
eGFR by CKD-EPI 2021: >60 mL/min/{1.73_m2} (ref >59)

## 2023-07-30 LAB — CBC, DIFF
% Basophils: 1 %
% Eosinophils: 2 %
% Immature Granulocytes: 1 %
% Lymphocytes: 26 %
% Monocytes: 7 %
% Neutrophils: 63 %
% Nucleated RBC: 0 %
Absolute Eosinophil Count: 0.12 10*3/uL (ref 0.00–0.50)
Absolute Lymphocyte Count: 1.63 10*3/uL (ref 1.00–4.80)
Basophils: 0.04 10*3/uL (ref 0.00–0.20)
Hematocrit: 43 % (ref 36.0–45.0)
Hemoglobin: 14.4 g/dL (ref 11.5–15.5)
Immature Granulocytes: 0.03 10*3/uL (ref 0.00–0.05)
MCH: 29.2 pg (ref 27.3–33.6)
MCHC: 33.5 g/dL (ref 32.2–36.5)
MCV: 87 fL (ref 81–98)
Monocytes: 0.42 10*3/uL (ref 0.00–0.80)
Neutrophils: 4.03 10*3/uL (ref 1.80–7.00)
Nucleated RBC: 0 10*3/uL
Platelet Count: 180 10*3/uL (ref 150–400)
RBC: 4.93 10*6/uL (ref 3.80–5.00)
RDW-CV: 13.3 % (ref 11.0–14.5)
WBC: 6.27 10*3/uL (ref 4.3–10.0)

## 2023-07-30 LAB — CARCINOEMBRYONIC ANTIGEN: Carcinoembryonic Antigen: 4 ng/mL (ref 0.0–5.0)

## 2023-07-30 MED ORDER — INFLUENZA VAC SPLIT HIGH-DOSE 0.5 ML IM SUSY
0.5000 mL | PREFILLED_SYRINGE | Freq: Once | INTRAMUSCULAR | Status: AC
Start: 2023-07-30 — End: 2023-07-30
  Administered 2023-07-30: 0.5 mL via INTRAMUSCULAR

## 2023-07-30 MED ORDER — COVID-19 MRNA VAC-TRIS(PFIZER) 30 MCG/0.3ML IM SUSY
30.0000 ug | PREFILLED_SYRINGE | Freq: Once | INTRAMUSCULAR | Status: AC
Start: 2023-07-30 — End: 2023-07-30
  Administered 2023-07-30: 30 ug via INTRAMUSCULAR

## 2023-07-30 NOTE — Progress Notes (Unsigned)
Flu Vaccine Screening Questions    Have you had a serious reaction or an allergic reaction to the influenza vaccine? NO  Currently have a moderate or severe illness, including fever? NO  Ever had Guillain-Barre syndrome associated with a vaccine? NO  Less than 6 months old? No    If YES to any of the questions above - Do NOT give vaccine.  Consult with provider.  If NO to all questions above - Patient may receive Flu Shot (IM).    All patients are encouraged to wait 15 minutes before leaving after receiving any vaccine.    Vaccine Information Statement (VIS) given 07/30/2023 by Haywood Lasso, LPN.COVID-19 Vaccine Screening Questions    1. Have you ever had an allergic reaction to a component of a COVID-19 vaccine or a previous dose of COVID-19 vaccine? NO  2. Currently have a moderate or severe illness, including fever? NO  3. Have you had a history of myocarditis (inflammation of heart muscle) or pericarditis (inflammation of lining outside of heart) within 3 weeks after any dose of COVID-19 vaccine? NO    If YES to any of the questions above - Do NOT give vaccine.  Consult with provider.  If NO to all questions above - Patient may receive COVID-19 Vaccine (IM).    All patients are encouraged to wait 15 minutes before leaving after receiving any vaccine.

## 2023-09-11 ENCOUNTER — Encounter (HOSPITAL_BASED_OUTPATIENT_CLINIC_OR_DEPARTMENT_OTHER): Payer: Self-pay | Admitting: Hematology & Oncology

## 2024-02-21 ENCOUNTER — Ambulatory Visit: Admission: RE | Admit: 2024-02-21 | Discharge: 2024-02-21 | Disposition: A | Discharge status: Home / Self Care

## 2024-02-21 DIAGNOSIS — C182 Malignant neoplasm of ascending colon: Secondary | ICD-10-CM | POA: Insufficient documentation

## 2024-02-21 LAB — COMPREHENSIVE METABOLIC PANEL
ALT (GPT): 25 U/L (ref 7–33)
AST (GOT): 13 U/L (ref 9–38)
Albumin: 4.2 g/dL (ref 3.5–5.2)
Alkaline Phosphatase (Total): 74 U/L (ref 49–199)
Anion Gap: 1 — ABNORMAL LOW (ref 4–12)
Bilirubin (Total): 0.5 mg/dL (ref 0.2–1.3)
Calcium: 9.5 mg/dL (ref 8.9–10.2)
Carbon Dioxide, Total: 31 meq/L (ref 22–32)
Chloride: 102 meq/L (ref 98–108)
Creatinine: 0.85 mg/dL (ref 0.38–1.02)
Glucose: 105 mg/dL (ref 62–125)
Potassium: 3.8 meq/L (ref 3.6–5.2)
Protein (Total): 7 g/dL (ref 6.0–8.2)
Sodium: 134 meq/L — ABNORMAL LOW (ref 135–145)
Urea Nitrogen: 17 mg/dL (ref 8–21)
eGFR by CKD-EPI 2021: >60 mL/min/{1.73_m2} (ref >59)

## 2024-02-21 LAB — CBC, DIFF
% Basophils: 0 %
% Eosinophils: 2 %
% Immature Granulocytes: 0 %
% Lymphocytes: 23 %
% Monocytes: 6 %
% Neutrophils: 69 %
% Nucleated RBC: 0 %
Absolute Eosinophil Count: 0.12 10*3/uL (ref 0.00–0.50)
Absolute Lymphocyte Count: 1.67 10*3/uL (ref 1.00–4.80)
Basophils: 0.03 10*3/uL (ref 0.00–0.20)
Hematocrit: 44 % (ref 36.0–45.0)
Hemoglobin: 15.1 g/dL (ref 11.5–15.5)
Immature Granulocytes: 0.02 10*3/uL (ref 0.00–0.05)
MCH: 29.7 pg (ref 27.3–33.6)
MCHC: 34.2 g/dL (ref 32.2–36.5)
MCV: 87 fL (ref 81–98)
Monocytes: 0.46 10*3/uL (ref 0.00–0.80)
Neutrophils: 4.9 10*3/uL (ref 1.80–7.00)
Nucleated RBC: 0 10*3/uL
Platelet Count: 195 10*3/uL (ref 150–400)
RBC: 5.09 10*6/uL — ABNORMAL HIGH (ref 3.80–5.00)
RDW-CV: 13.2 % (ref 11.0–14.5)
WBC: 7.2 10*3/uL (ref 4.3–10.0)

## 2024-02-21 LAB — CARCINOEMBRYONIC ANTIGEN: Carcinoembryonic Antigen: 4 ng/mL (ref 0.0–5.0)

## 2024-02-21 MED ORDER — IOHEXOL 350 MG/ML IV SOLN
100.0000 mL | Freq: Once | INTRAVENOUS | Status: AC | PRN
Start: 2024-02-21 — End: 2024-02-21
  Administered 2024-02-21: 100 mL via INTRAVENOUS

## 2024-02-21 MED ORDER — IOHEXOL 9 MG/ML OR SOLN
500.0000 mL | Freq: Once | ORAL | Status: AC | PRN
Start: 2024-02-21 — End: 2024-02-21
  Administered 2024-02-21: 500 mL via ORAL

## 2024-02-22 ENCOUNTER — Encounter (HOSPITAL_BASED_OUTPATIENT_CLINIC_OR_DEPARTMENT_OTHER): Payer: Self-pay

## 2024-02-26 NOTE — Progress Notes (Unsigned)
 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    ONCOLOGY CARE TEAM  Medical Oncologist: Elois Hair  Surgical Oncologist: Rosezetta Contras  Radiation Oncologist: N/A  PCP: Viviano Ground, MD    CURRENT TREATMENT  [No matching plan found]  [No matching plan found] Surveillance    TREATMENT HISTORY  Presented with anemia, SOB. 6//21 Colonoscopy: mass in cecum inv IC valve. 04/06/20 CT CAP: 7.3 x 6.7 cecal mass inv TI, local LN to 21 x 14 x 17mm, no distant mets. CEA 2.5.  04/16/20. R hemicolectomy Loran Rock). pT3N2a. 8cm inv poorly diff adenocarcinoma of the cecum with medullary features, +LVI, -PNI, R0, 4/13 LN+; MSS, low TMB  06/01/20 - 10/04/20. Adjuvant capecitabine  x5 cycles. Initial cape 1500mg  BID. C4: 1000/1500 for fatigue. C5: 1000/1000 for fatigue. Stopped after C5 d/t toxicity (fatigue).   10/2020 - present. Surveillance. 10/16/20 CT: NED, new PVT of R ant subseg, R post, resolved mes LN, st sub-59mm lung nodules. 01/30/21 CT: NED, st lung nodules, chronic R PVT. 05/17/21 Colonoscopy: 5 dim TA/HP in rectum, sig, desc, transverse, hep flexure. 9/21 ER for abd pain, rectal bleeding; +UTI. 12/26/21 CT: NED. 01/17/23 CT: NED, st lung nodules. 02/21/24 CT: NED, st lung nodules  CEA 2-4    INTERVAL EVENTS  Valerye says that she is doing well.   No GI issues. Rare constipation  Energy has been lower than she would like, but not acutely changing. [name] has noted her energy has declined  Started memantine. Saw cardiology who is trying to see if something needs to be optimized.    ECOG PERFORMANCE STATUS  ECOG: (1) Restricted in physically strenuous activity, ambulatory and able to do work of light nature     ROS  Complete review of systems otherwise negative.    PROBLEM LIST  Patient Active Problem  List   Diagnosis    Papillary thyroid  carcinoma (HCC)    Osteoporosis    Hypertension    Dyslipidemia    Hypothyroidism, postsurgical    Malignant neoplasm of ascending colon (HCC)    Cecal cancer (HCC)    Acquired pes planus of both feet    Anxiety    Chronic right-sided low back pain without sciatica    Diverticulosis    Hiatal hernia    History of dizziness    Hyperlipidemia    Insomnia due to medical condition    Iron  deficiency anemia    Malignant neoplasm of breast (HCC)    NPH (normal pressure hydrocephalus) (HCC)    Sleep apnea    Other chronic pain    Polyp of colon    Postherpetic neuralgia    Presence of artificial knee joint, right    Primary adenocarcinoma of colon (HCC)    Pronation deformity of both feet    Right lower quadrant abdominal pain    Thyroid  cancer (HCC)    Urge incontinence       PAST MEDICAL HISTORY  Past Medical History:   Diagnosis Date    Thyroid  cancer (HCC) 2011    Breast mass 1990    Removed    Anemia     Little    Colon cancer (HCC)     Sleep apnea in adult     "I stopped using my CPAP"  Breast cancer  - R mastectomy 1990  Thyroid  cancer  - total thyroidectomy 2011    Past Surgical History:   Procedure Laterality Date    COLONOSCOPY  04/01/2020    PR APPENDECTOMY  1990s    PR UNLISTED PROCEDURE BREAST Right 1990    Masectomy    THYROID  SURGERY  2011    total thyroidectomy    TOTAL KNEE ARTHROPLASTY Left 09/2014       FAMILY HISTORY  Family History       Problem (# of Occurrences) Relation (Name,Age of Onset)    Breast Cancer (1) Sister    Kidney Cancer (1) Brother    Rheumatoid Arthritis (1) Father            SOCIAL HISTORY  Originally from the [address], lived in [place], [place] prior to moving to [place] about 5 years ago to be closer to her two sons Autry Legions). She now lives close to [facility]. Never smoker, occasional alcohol. 7 grandkids      ALLERGIES  Review of patient's allergies indicates:  Allergies   Allergen Reactions    Atorvastatin Palpitations    Ciprofloxacin  Unknown     Niacin And Related Flushing    Rosuvastatin Palpitations     Palp.    Sulfa Antibiotics Unknown       HOME MEDICATIONS   Current Outpatient Medications:     acetaminophen  500 MG tablet, Take 2 tablets (1,000 mg) by mouth every 6 hours as needed for pain or severe pain. (Patient not taking: Reported on 01/31/2021), Disp: 40 tablet, Rfl: 0    amLODIPine  10 MG tablet, Take 1 tablet (10 mg) by mouth daily., Disp: 7 tablet, Rfl: 0    Ferrous Sulfate (iron ) 325 (65 Fe) MG tablet, Take 1 tablet (325 mg) by mouth daily. (Patient not taking: Reported on 01/03/2022), Disp: 7 tablet, Rfl: 0    levothyroxine  137 MCG tablet, Take 1 tablet (137 mcg) by mouth daily on an empty stomach., Disp: 7 tablet, Rfl: 0    losartan 25 MG tablet, TAKE 1 TABLET BY MOUTH EVERY DAY WITH 50MG  TABLET FOR TOTAL 75MG  PER DAY (Patient not taking: Reported on 07/30/2023), Disp: , Rfl:     losartan 50 MG tablet, TAKE 1 TABLET BY MOUTH EVERY DAY WITH 25MG  TABLET FOR TOTAL 75MG  PER DAY, Disp: , Rfl:     Na sulfate-K sulfate-Mg sulfate (Suprep Bowel Prep  Kit) solution, Follow split prep instructions provided by [facility] (Patient not taking: Reported on 01/03/2022), Disp: 354 mL, Rfl: 0    urea  20 % cream, Apply topically to hands and feet 3 times a day as needed for dry skin. (Patient not taking: Reported on 01/03/2022), Disp: 480 g, Rfl: 3     PHYSICAL EXAMINATION  Wt Readings from Last 4 Encounters:   07/30/23 71.9 kg (158 lb 9.6 oz)   07/18/22 68.9 kg (152 lb)   01/03/22 70.5 kg (155 lb 6.8 oz)   05/23/21 71.7 kg (158 lb)     GENERAL: well-appearing, no acute distress  NEURO: grossly normal  SKIN: no visible rashes  PSYCH: appropriate    LABORATORY STUDIES  No results found for any visits on 02/28/24.        Recent Labs     02/21/24  1550 07/30/23  1609 01/17/23  0956 07/18/22  1110   CEA 4.0 4.0 3.5 2.9         PLAN:  1) Colon cancer  Kenetha has a medullary cancer, which is a  rarer subtype. Surprisingly, her tumor is MSS with low TMB. After resection she  elected adjuvant treatment with capecitabine  monotherapy to mitigate toxicities. Our initial aim was for at least 3 months of treatment. She ended up receiving 5 cycles and then electing to stop. She remains clinically NED on recent scans. As she is about 4 years out and NED. Continue surveillance with:  - visits and labs q6 mo x 3 years (through 04/2025)   - CEA not elevated at baseline  - CT annually - due 02/2025 (final)  - colonoscopy - due 04/2022-25 -- pt decided to defer for now    2) Treatment: surveillance  *Low energy - agree with cardiology work-up. No sign of cancer recurrence    3) Nutrition  Doing well    4) Pain  None discussed today    RTC in 6 months    This note was completed at least in part with voice recognition software.    Distant Site Telemedicine Encounter  I conducted this encounter via secure, live, face-to-face video conference with the patient. I reviewed the risks and benefits of telemedicine as pertinent to this visit and the patient agreed to proceed.    Provider Location: Off-site location (home, non-[place] location)  Patient Location: At home    Present with patient: Family member

## 2024-02-28 ENCOUNTER — Ambulatory Visit: Attending: Hematology & Oncology | Admitting: Hematology & Oncology

## 2024-02-28 DIAGNOSIS — C182 Malignant neoplasm of ascending colon: Secondary | ICD-10-CM

## 2024-07-29 ENCOUNTER — Encounter (HOSPITAL_BASED_OUTPATIENT_CLINIC_OR_DEPARTMENT_OTHER): Payer: Self-pay

## 2024-10-31 NOTE — Progress Notes (Unsigned)
 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 ***    ONCOLOGY CARE TEAM  Medical Oncologist: Harlene Schneider  Surgical Oncologist: Cannon Cooler  Radiation Oncologist: N/A  PCP: Nathaniel Candyce LABOR, MD    CURRENT TREATMENT  [No matching plan found]  [No matching plan found] Surveillance    TREATMENT HISTORY  Presented with anemia, SOB. 6//21 Colonoscopy: mass in cecum inv IC valve. 04/06/20 CT CAP: 7.3 x 6.7 cecal mass inv TI, local LN to 21 x 14 x 17mm, no distant mets. CEA 2.5.  04/16/20. R hemicolectomy Evern). pT3N2a. 8cm inv poorly diff adenocarcinoma of the cecum with medullary features, +LVI, -PNI, R0, 4/13 LN+; MSS, low TMB  06/01/20 - 10/04/20. Adjuvant capecitabine  x5 cycles. Initial cape 1500mg  BID. C4: 1000/1500 for fatigue. C5: 1000/1000 for fatigue. Stopped after C5 d/t toxicity (fatigue).   10/2020 - present. Surveillance. 10/16/20 CT: NED, new PVT of R ant subseg, R post, resolved mes LN, st sub-28mm lung nodules. 01/30/21 CT: NED, st lung nodules, chronic R PVT. 05/17/21 Colonoscopy: 5 dim TA/HP in rectum, sig, desc, transverse, hep flexure. 9/21 ER for abd pain, rectal bleeding; +UTI. 12/26/21 CT: NED. 01/17/23 CT: NED, st lung nodules. 02/21/24 CT: NED, st lung nodules  CEA 2-4    INTERVAL EVENTS  Olivia Mcconnell says ***     that she is doing well.   No GI issues. Rare constipation  Energy has been lower than she would like, but not acutely changing. Olivia Mcconnell has noted her energy has declined  Started memantine. Saw cardiology who is trying to see if something needs to be optimized.    ECOG PERFORMANCE STATUS  ECOG: (1) Restricted in physically strenuous activity, ambulatory and able to do work of light nature     ROS  Complete review of systems otherwise negative.    PROBLEM LIST  Patient  Active Problem List   Diagnosis    Papillary thyroid  carcinoma (HCC)    Osteoporosis    Hypertension    Dyslipidemia    Hypothyroidism, postsurgical    Malignant neoplasm of ascending colon (HCC)    Cecal cancer (HCC)    Acquired pes planus of both feet    Anxiety    Chronic right-sided low back pain without sciatica    Diverticulosis    Hiatal hernia    History of dizziness    Hyperlipidemia    Insomnia due to medical condition    Iron  deficiency anemia    Malignant neoplasm of breast (HCC)    NPH (normal pressure hydrocephalus) (HCC)    Sleep apnea    Other chronic pain    Polyp of colon    Postherpetic neuralgia    Presence of artificial knee joint, right    Primary adenocarcinoma of colon (HCC)    Pronation deformity of both feet    Right lower quadrant abdominal pain    Thyroid  cancer (HCC)    Urge incontinence       PAST MEDICAL HISTORY  Past Medical History:   Diagnosis Date    Thyroid  cancer (HCC) 2011    Breast mass 1990    Removed    Anemia     Little    Colon cancer (HCC)     Sleep apnea in adult  I stopped using my CPAP   Breast cancer  - R mastectomy 1990  Thyroid  cancer  - total thyroidectomy 2011    Past Surgical History:   Procedure Laterality Date    COLONOSCOPY  04/01/2020    PR APPENDECTOMY  1990s    PR UNLISTED PROCEDURE BREAST Right 1990    Masectomy    THYROID  SURGERY  2011    total thyroidectomy    TOTAL KNEE ARTHROPLASTY Left 09/2014       FAMILY HISTORY  Family History       Problem (# of Occurrences) Relation (Name,Age of Onset)    Breast Cancer (1) Sister    Kidney Cancer (1) Brother    Rheumatoid Arthritis (1) Father            SOCIAL HISTORY  Originally from the [address], lived in [place], [place] prior to moving to [place] about 5 years ago to be closer to her two sons Olivia Mcconnell). She now lives close to [facility]. Never smoker, occasional alcohol. 7 grandkids      ALLERGIES  Review of patient's allergies indicates:  Allergies   Allergen Reactions    Atorvastatin Palpitations     Ciprofloxacin  Unknown    Niacin And Related Flushing    Rosuvastatin Palpitations     Palp.    Sulfa Antibiotics Unknown       HOME MEDICATIONS   Current Outpatient Medications:     acetaminophen  500 MG tablet, Take 2 tablets (1,000 mg) by mouth every 6 hours as needed for pain or severe pain. (Patient not taking: Reported on 02/28/2024), Disp: 40 tablet, Rfl: 0    amLODIPine  10 MG tablet, Take 1 tablet (10 mg) by mouth daily., Disp: 7 tablet, Rfl: 0    Ferrous Sulfate (iron ) 325 (65 Fe) MG tablet, Take 1 tablet (325 mg) by mouth daily. (Patient not taking: Reported on 02/28/2024), Disp: 7 tablet, Rfl: 0    levothyroxine  137 MCG tablet, Take 1 tablet (137 mcg) by mouth daily on an empty stomach., Disp: 7 tablet, Rfl: 0    losartan 25 MG tablet, TAKE 1 TABLET BY MOUTH EVERY DAY WITH 50MG  TABLET FOR TOTAL 75MG  PER DAY (Patient not taking: Reported on 02/28/2024), Disp: , Rfl:     losartan 50 MG tablet, TAKE 1 TABLET BY MOUTH EVERY DAY WITH 25MG  TABLET FOR TOTAL 75MG  PER DAY, Disp: , Rfl:     memantine 10 MG tablet, Take 1 tablet (10 mg) by mouth daily., Disp: , Rfl:     Na sulfate-K sulfate-Mg sulfate (Suprep Bowel Prep  Kit) solution, Follow split prep instructions provided by [facility] (Patient not taking: Reported on 02/28/2024), Disp: 354 mL, Rfl: 0    urea  20 % cream, Apply topically to hands and feet 3 times a day as needed for dry skin. (Patient not taking: Reported on 02/28/2024), Disp: 480 g, Rfl: 3     PHYSICAL EXAMINATION  VITAL SIGNS: There were no vitals taken for this visit. ***    Wt Readings from Last 4 Encounters:   07/30/23 71.9 kg (158 lb 9.6 oz)   07/18/22 68.9 kg (152 lb)   01/03/22 70.5 kg (155 lb 6.8 oz)   05/23/21 71.7 kg (158 lb)       GENERAL: well-appearing, no acute distress  HEENT: sclera anicteric, normal pupils, oropharynx is clear with moist mucous membranes  NECK: no cervical or supraclavicular lymphadenopathy, no thyromegaly  CV: normal S1 and S2, no murmurs/rubs/gallops  LUNGS: clear to  auscultation bilaterally, no  wheezes/rales/rhonchi  ABD: soft, non-tender, non-distended, no palpable masses  EXT: warm and well perfused, no edema  NEURO: grossly normal, narrow-based gait  SKIN: no rashes, lesions, or jaundice      LABORATORY STUDIES  No results found for any visits on 11/03/24.        Recent Labs     Units 02/21/24  1550 07/30/23  1609 01/17/23  0956   Carcinoembryonic Antigen ng/mL 4.0 4.0 3.5         PLAN:  1) Colon cancer  [place] has a medullary cancer, which is a rarer subtype. Surprisingly, her tumor is MSS with low TMB. After resection she elected adjuvant treatment with capecitabine  monotherapy to mitigate toxicities. Our initial aim was for at least 3 months of treatment. She ended up receiving 5 cycles and then electing to stop. She remains clinically NED on recent scans. As she is about 4 years out and NED. Continue surveillance with:  - visits and labs q6 mo x 3 years (through 04/2025)   - CEA not elevated at baseline  - CT annually - due 02/2025 (final) -- ordered ***  - colonoscopy - due 04/2022-25 -- pt decided to defer for now    2) Treatment: surveillance  *Low energy - agree with cardiology work-up. No sign of cancer recurrence    3) Nutrition  Doing well    4) Pain  None discussed today    RTC in 6 months for a final visit***    This note was completed at least in part with voice recognition software.

## 2024-11-03 ENCOUNTER — Ambulatory Visit: Attending: Hematology & Oncology | Admitting: Medical Oncology

## 2024-11-03 ENCOUNTER — Ambulatory Visit (HOSPITAL_BASED_OUTPATIENT_CLINIC_OR_DEPARTMENT_OTHER): Payer: Self-pay | Admitting: Hematology & Oncology

## 2024-11-03 ENCOUNTER — Ambulatory Visit (HOSPITAL_BASED_OUTPATIENT_CLINIC_OR_DEPARTMENT_OTHER): Admitting: Hematology & Oncology

## 2024-11-03 DIAGNOSIS — C182 Malignant neoplasm of ascending colon: Secondary | ICD-10-CM

## 2024-11-03 LAB — CBC, DIFF
% Basophils: 0 %
% Eosinophils: 4 %
% Immature Granulocytes: 0 %
% Lymphocytes: 24 %
% Monocytes: 5 %
% Neutrophils: 67 %
% Nucleated RBC: 0 %
Absolute Eosinophil Count: 0.24 10*3/uL (ref 0.00–0.50)
Absolute Lymphocyte Count: 1.57 10*3/uL (ref 1.00–4.80)
Basophils: 0.02 10*3/uL (ref 0.00–0.20)
Hematocrit: 43 % (ref 36.0–45.0)
Hemoglobin: 14.3 g/dL (ref 11.5–15.5)
Immature Granulocytes: 0.01 10*3/uL (ref 0.00–0.05)
MCH: 28.9 pg (ref 27.3–33.6)
MCHC: 33.6 g/dL (ref 32.2–36.5)
MCV: 86 fL (ref 81–98)
Monocytes: 0.36 10*3/uL (ref 0.00–0.80)
Neutrophils: 4.43 10*3/uL (ref 1.80–7.00)
Nucleated RBC: 0 10*3/uL
Platelet Count: 182 10*3/uL (ref 150–400)
RBC: 4.95 10*6/uL (ref 3.80–5.00)
RDW-CV: 13.7 % (ref 11.0–14.5)
WBC: 6.63 10*3/uL (ref 4.3–10.0)

## 2024-11-03 LAB — COMPREHENSIVE METABOLIC PANEL
ALT (Beckman): 12 U/L (ref 7–33)
AST (Beckman): 11 U/L (ref 9–38)
Albumin: 4.1 g/dL (ref 3.5–5.2)
Alkaline Phosphatase (Total): 81 U/L (ref 49–199)
Anion Gap: 8 (ref 4–12)
Calcium: 9.4 mg/dL (ref 8.9–10.2)
Carbon Dioxide, Total: 25 meq/L (ref 22–32)
Chloride: 106 meq/L (ref 98–108)
Creatinine: 0.68 mg/dL (ref 0.38–1.02)
Glucose: 125 mg/dL (ref 62–125)
Potassium: 4 meq/L (ref 3.6–5.2)
Sodium: 139 meq/L (ref 135–145)
Total Bilirubin (Beckman): 0.4 mg/dL (ref 0.2–1.3)
Total Protein (Beckman): 7.1 g/dL (ref 6.0–8.2)
Urea Nitrogen: 17 mg/dL (ref 8–21)
eGFR by CKD-EPI 2021: >60 mL/min/{1.73_m2} (ref >59)

## 2024-11-03 LAB — CARCINOEMBRYONIC ANTIGEN: Carcinoembryonic Antigen: 2.8 ng/mL (ref 0.0–5.0)
